# Patient Record
Sex: Male | Born: 1935 | Race: Black or African American | Hispanic: No | Marital: Married | Smoking: Former smoker
Health system: Southern US, Community
[De-identification: ages and names within clinical notes are randomized; demographics above are authoritative.]

## PROBLEM LIST (undated history)

## (undated) DIAGNOSIS — N400 Enlarged prostate without lower urinary tract symptoms: Secondary | ICD-10-CM

## (undated) DIAGNOSIS — C801 Malignant (primary) neoplasm, unspecified: Secondary | ICD-10-CM

## (undated) DIAGNOSIS — F0390 Unspecified dementia without behavioral disturbance: Secondary | ICD-10-CM

## (undated) DIAGNOSIS — I82409 Acute embolism and thrombosis of unspecified deep veins of unspecified lower extremity: Secondary | ICD-10-CM

## (undated) DIAGNOSIS — E785 Hyperlipidemia, unspecified: Secondary | ICD-10-CM

## (undated) DIAGNOSIS — I10 Essential (primary) hypertension: Secondary | ICD-10-CM

## (undated) HISTORY — PX: COLON SURGERY: SHX602

## (undated) HISTORY — DX: Malignant (primary) neoplasm, unspecified: C80.1

## (undated) HISTORY — PX: HERNIA REPAIR: SHX51

## (undated) HISTORY — DX: Hyperlipidemia, unspecified: E78.5

## (undated) HISTORY — DX: Essential (primary) hypertension: I10

## (undated) HISTORY — DX: Acute embolism and thrombosis of unspecified deep veins of unspecified lower extremity: I82.409

## (undated) HISTORY — DX: Benign prostatic hyperplasia without lower urinary tract symptoms: N40.0

---

## 2003-12-15 ENCOUNTER — Ambulatory Visit: Payer: Self-pay | Admitting: Internal Medicine

## 2004-06-14 ENCOUNTER — Ambulatory Visit: Payer: Self-pay | Admitting: Internal Medicine

## 2004-12-14 ENCOUNTER — Encounter: Payer: Self-pay | Admitting: Internal Medicine

## 2004-12-15 ENCOUNTER — Ambulatory Visit: Payer: Self-pay | Admitting: Internal Medicine

## 2005-03-20 ENCOUNTER — Ambulatory Visit: Payer: Self-pay | Admitting: Internal Medicine

## 2006-06-15 ENCOUNTER — Emergency Department (HOSPITAL_COMMUNITY): Admission: EM | Admit: 2006-06-15 | Discharge: 2006-06-15 | Payer: Self-pay | Admitting: *Deleted

## 2006-07-20 ENCOUNTER — Ambulatory Visit: Payer: Self-pay | Admitting: Internal Medicine

## 2006-07-20 LAB — CONVERTED CEMR LAB
Bilirubin, Direct: 0.1 mg/dL (ref 0.0–0.3)
Calcium: 9.4 mg/dL (ref 8.4–10.5)
Eosinophils Absolute: 0.1 10*3/uL (ref 0.0–0.6)
Eosinophils Relative: 2.6 % (ref 0.0–5.0)
GFR calc Af Amer: 61 mL/min
GFR calc non Af Amer: 51 mL/min
Glucose, Bld: 82 mg/dL (ref 70–99)
Lymphocytes Relative: 34.2 % (ref 12.0–46.0)
MCV: 93.9 fL (ref 78.0–100.0)
Monocytes Absolute: 0.3 10*3/uL (ref 0.2–0.7)
Neutro Abs: 1.7 10*3/uL (ref 1.4–7.7)
Neutrophils Relative %: 54.6 % (ref 43.0–77.0)
Platelets: 168 10*3/uL (ref 150–400)
Potassium: 4.7 meq/L (ref 3.5–5.1)
Sodium: 145 meq/L (ref 135–145)
TSH: 0.91 microintl units/mL (ref 0.35–5.50)
WBC: 3.2 10*3/uL — ABNORMAL LOW (ref 4.5–10.5)

## 2006-07-30 ENCOUNTER — Encounter: Payer: Self-pay | Admitting: Internal Medicine

## 2006-07-30 DIAGNOSIS — Z85038 Personal history of other malignant neoplasm of large intestine: Secondary | ICD-10-CM

## 2006-07-30 DIAGNOSIS — I10 Essential (primary) hypertension: Secondary | ICD-10-CM | POA: Insufficient documentation

## 2006-07-30 DIAGNOSIS — N400 Enlarged prostate without lower urinary tract symptoms: Secondary | ICD-10-CM

## 2006-07-30 DIAGNOSIS — E785 Hyperlipidemia, unspecified: Secondary | ICD-10-CM | POA: Insufficient documentation

## 2006-11-05 ENCOUNTER — Ambulatory Visit: Payer: Self-pay | Admitting: Internal Medicine

## 2006-11-05 DIAGNOSIS — D631 Anemia in chronic kidney disease: Secondary | ICD-10-CM

## 2006-11-05 DIAGNOSIS — N189 Chronic kidney disease, unspecified: Secondary | ICD-10-CM

## 2006-11-05 LAB — CONVERTED CEMR LAB
Basophils Relative: 0.3 % (ref 0.0–1.0)
HCT: 36.2 % — ABNORMAL LOW (ref 39.0–52.0)
Hemoglobin: 12.2 g/dL — ABNORMAL LOW (ref 13.0–17.0)
Monocytes Absolute: 0.3 10*3/uL (ref 0.2–0.7)
Neutrophils Relative %: 48.2 % (ref 43.0–77.0)
RBC: 3.83 M/uL — ABNORMAL LOW (ref 4.22–5.81)
RDW: 13.9 % (ref 11.5–14.6)
WBC: 3.4 10*3/uL — ABNORMAL LOW (ref 4.5–10.5)

## 2007-04-16 ENCOUNTER — Telehealth: Payer: Self-pay | Admitting: Internal Medicine

## 2007-05-06 ENCOUNTER — Emergency Department (HOSPITAL_COMMUNITY): Admission: EM | Admit: 2007-05-06 | Discharge: 2007-05-06 | Payer: Self-pay | Admitting: Emergency Medicine

## 2007-05-25 ENCOUNTER — Ambulatory Visit: Payer: Self-pay | Admitting: Family Medicine

## 2007-05-25 ENCOUNTER — Emergency Department (HOSPITAL_COMMUNITY): Admission: EM | Admit: 2007-05-25 | Discharge: 2007-05-25 | Payer: Self-pay | Admitting: Emergency Medicine

## 2007-05-25 DIAGNOSIS — J209 Acute bronchitis, unspecified: Secondary | ICD-10-CM

## 2007-05-28 ENCOUNTER — Ambulatory Visit: Payer: Self-pay

## 2007-05-28 ENCOUNTER — Ambulatory Visit: Payer: Self-pay | Admitting: Internal Medicine

## 2007-05-28 ENCOUNTER — Inpatient Hospital Stay (HOSPITAL_COMMUNITY): Admission: AD | Admit: 2007-05-28 | Discharge: 2007-05-31 | Payer: Self-pay | Admitting: Internal Medicine

## 2007-05-28 DIAGNOSIS — R55 Syncope and collapse: Secondary | ICD-10-CM

## 2007-05-28 DIAGNOSIS — M79609 Pain in unspecified limb: Secondary | ICD-10-CM

## 2007-05-29 ENCOUNTER — Telehealth: Payer: Self-pay | Admitting: Internal Medicine

## 2007-06-03 ENCOUNTER — Telehealth: Payer: Self-pay | Admitting: Internal Medicine

## 2007-06-03 ENCOUNTER — Encounter: Payer: Self-pay | Admitting: Internal Medicine

## 2007-06-06 ENCOUNTER — Telehealth: Payer: Self-pay | Admitting: Internal Medicine

## 2007-06-11 ENCOUNTER — Telehealth: Payer: Self-pay | Admitting: Internal Medicine

## 2007-06-12 ENCOUNTER — Telehealth: Payer: Self-pay | Admitting: Internal Medicine

## 2007-06-21 ENCOUNTER — Telehealth: Payer: Self-pay | Admitting: Internal Medicine

## 2007-06-24 ENCOUNTER — Ambulatory Visit: Payer: Self-pay | Admitting: Internal Medicine

## 2007-06-24 DIAGNOSIS — I82409 Acute embolism and thrombosis of unspecified deep veins of unspecified lower extremity: Secondary | ICD-10-CM | POA: Insufficient documentation

## 2007-07-04 ENCOUNTER — Encounter: Payer: Self-pay | Admitting: Internal Medicine

## 2007-07-15 ENCOUNTER — Ambulatory Visit: Payer: Self-pay | Admitting: Internal Medicine

## 2007-07-29 ENCOUNTER — Telehealth: Payer: Self-pay | Admitting: *Deleted

## 2007-07-29 ENCOUNTER — Ambulatory Visit: Payer: Self-pay | Admitting: Internal Medicine

## 2007-07-29 LAB — CONVERTED CEMR LAB
INR: 1.3
Prothrombin Time: 14.3 s

## 2007-08-26 ENCOUNTER — Ambulatory Visit: Payer: Self-pay | Admitting: Cardiology

## 2007-09-06 ENCOUNTER — Ambulatory Visit: Payer: Self-pay | Admitting: Cardiovascular Disease

## 2007-09-12 ENCOUNTER — Ambulatory Visit: Payer: Self-pay | Admitting: Cardiovascular Disease

## 2007-09-16 ENCOUNTER — Ambulatory Visit: Payer: Self-pay | Admitting: Cardiovascular Disease

## 2007-09-23 ENCOUNTER — Ambulatory Visit: Payer: Self-pay | Admitting: Cardiovascular Disease

## 2007-10-03 ENCOUNTER — Ambulatory Visit: Payer: Self-pay | Admitting: Internal Medicine

## 2007-10-17 ENCOUNTER — Ambulatory Visit: Payer: Self-pay | Admitting: Internal Medicine

## 2007-11-14 ENCOUNTER — Ambulatory Visit: Payer: Self-pay | Admitting: Cardiology

## 2007-12-12 ENCOUNTER — Ambulatory Visit: Payer: Self-pay | Admitting: Internal Medicine

## 2007-12-26 ENCOUNTER — Ambulatory Visit: Payer: Self-pay | Admitting: Cardiovascular Disease

## 2008-01-20 ENCOUNTER — Ambulatory Visit: Payer: Self-pay | Admitting: Internal Medicine

## 2008-02-07 ENCOUNTER — Ambulatory Visit: Payer: Self-pay | Admitting: Cardiovascular Disease

## 2008-02-19 ENCOUNTER — Ambulatory Visit: Payer: Self-pay | Admitting: Cardiovascular Disease

## 2008-03-04 ENCOUNTER — Ambulatory Visit: Payer: Self-pay | Admitting: Cardiology

## 2008-04-01 ENCOUNTER — Ambulatory Visit: Payer: Self-pay | Admitting: Cardiology

## 2008-04-15 ENCOUNTER — Ambulatory Visit: Payer: Self-pay | Admitting: Cardiology

## 2008-05-06 ENCOUNTER — Ambulatory Visit: Payer: Self-pay | Admitting: Cardiovascular Disease

## 2008-06-03 ENCOUNTER — Ambulatory Visit: Payer: Self-pay | Admitting: Cardiology

## 2008-06-16 ENCOUNTER — Ambulatory Visit: Payer: Self-pay | Admitting: Cardiology

## 2008-06-25 ENCOUNTER — Ambulatory Visit: Payer: Self-pay | Admitting: Internal Medicine

## 2008-06-30 ENCOUNTER — Ambulatory Visit: Payer: Self-pay | Admitting: Internal Medicine

## 2008-07-28 ENCOUNTER — Encounter: Payer: Self-pay | Admitting: *Deleted

## 2008-09-02 ENCOUNTER — Encounter: Payer: Self-pay | Admitting: *Deleted

## 2008-10-07 ENCOUNTER — Encounter: Payer: Self-pay | Admitting: Cardiology

## 2008-10-29 ENCOUNTER — Ambulatory Visit: Payer: Self-pay | Admitting: Internal Medicine

## 2008-10-29 DIAGNOSIS — R634 Abnormal weight loss: Secondary | ICD-10-CM

## 2008-10-29 LAB — CONVERTED CEMR LAB
ALT: 12 units/L (ref 0–53)
AST: 20 units/L (ref 0–37)
Albumin: 3.4 g/dL — ABNORMAL LOW (ref 3.5–5.2)
Alkaline Phosphatase: 71 units/L (ref 39–117)
Basophils Relative: 0.6 % (ref 0.0–3.0)
Bilirubin, Direct: 0.1 mg/dL (ref 0.0–0.3)
CO2: 28 meq/L (ref 19–32)
Calcium: 9 mg/dL (ref 8.4–10.5)
Chloride: 110 meq/L (ref 96–112)
Eosinophils Absolute: 0.1 10*3/uL (ref 0.0–0.7)
Eosinophils Relative: 2 % (ref 0.0–5.0)
Hemoglobin: 11.1 g/dL — ABNORMAL LOW (ref 13.0–17.0)
Lymphocytes Relative: 29.6 % (ref 12.0–46.0)
MCHC: 33.9 g/dL (ref 30.0–36.0)
MCV: 93 fL (ref 78.0–100.0)
Neutro Abs: 1.9 10*3/uL (ref 1.4–7.7)
Neutrophils Relative %: 57.4 % (ref 43.0–77.0)
RBC: 3.51 M/uL — ABNORMAL LOW (ref 4.22–5.81)
Sodium: 142 meq/L (ref 135–145)
Total Protein: 6.1 g/dL (ref 6.0–8.3)
WBC: 3.2 10*3/uL — ABNORMAL LOW (ref 4.5–10.5)

## 2009-09-14 ENCOUNTER — Telehealth: Payer: Self-pay | Admitting: Internal Medicine

## 2009-11-18 ENCOUNTER — Encounter: Payer: Self-pay | Admitting: Internal Medicine

## 2010-01-31 ENCOUNTER — Telehealth: Payer: Self-pay | Admitting: Internal Medicine

## 2010-02-03 ENCOUNTER — Encounter: Payer: Self-pay | Admitting: Internal Medicine

## 2010-02-03 ENCOUNTER — Ambulatory Visit: Payer: Self-pay | Admitting: Internal Medicine

## 2010-02-03 ENCOUNTER — Ambulatory Visit: Payer: Self-pay

## 2010-02-03 DIAGNOSIS — Z8672 Personal history of thrombophlebitis: Secondary | ICD-10-CM | POA: Insufficient documentation

## 2010-02-03 DIAGNOSIS — R7989 Other specified abnormal findings of blood chemistry: Secondary | ICD-10-CM | POA: Insufficient documentation

## 2010-02-03 LAB — CONVERTED CEMR LAB
ALT: 14 units/L (ref 0–53)
AST: 20 units/L (ref 0–37)
BUN: 30 mg/dL — ABNORMAL HIGH (ref 6–23)
Basophils Absolute: 0 10*3/uL (ref 0.0–0.1)
Bilirubin, Direct: 0.1 mg/dL (ref 0.0–0.3)
Calcium: 9.3 mg/dL (ref 8.4–10.5)
Creatinine, Ser: 1.5 mg/dL (ref 0.4–1.5)
Eosinophils Relative: 3.7 % (ref 0.0–5.0)
GFR calc non Af Amer: 55.79 mL/min — ABNORMAL LOW (ref >60.00)
Glucose, Bld: 89 mg/dL (ref 70–99)
Lymphocytes Relative: 27.6 % (ref 12.0–46.0)
Lymphs Abs: 1 10*3/uL (ref 0.7–4.0)
Monocytes Relative: 9.1 % (ref 3.0–12.0)
Neutrophils Relative %: 59 % (ref 43.0–77.0)
Platelets: 196 10*3/uL (ref 150.0–400.0)
Potassium: 5.2 meq/L — ABNORMAL HIGH (ref 3.5–5.1)
RDW: 15.9 % — ABNORMAL HIGH (ref 11.5–14.6)
TSH: 1.42 microintl units/mL (ref 0.35–5.50)
Total Bilirubin: 0.5 mg/dL (ref 0.3–1.2)
WBC: 3.7 10*3/uL — ABNORMAL LOW (ref 4.5–10.5)

## 2010-03-29 NOTE — Miscellaneous (Signed)
Summary: flu vaccine   Clinical Lists Changes  Observations: Added new observation of FLU VAX: Historical (11/18/2009 11:59)      Immunization History:  Influenza Immunization History:    Influenza:  Historical (11/18/2009) given at [email or web address]

## 2010-03-29 NOTE — Miscellaneous (Signed)
Summary: Flu Shot/[facility]  Flu Shot/[facility]   Imported By: Maryln Gottron 11/23/2009 10:32:45  _____________________________________________________________________  External Attachment:    Type:   Image     Comment:   External Document

## 2010-03-29 NOTE — Progress Notes (Signed)
Summary: refills   Phone Note Refill Request Message from:  Fax from Pharmacy on January 31, 2010 12:45 PM  Refills Requested: Medication #1:  CARDURA 4 MG TABS Take 1 once a day  Medication #2:  MONOPRIL 20 MG TABS 1 once a day. [facility]   Method Requested: Fax to [facility] Initial call taken by: Duard Brady LPN,  January 31, 2010 12:45 PM    Prescriptions: MONOPRIL 20 MG TABS (FOSINOPRIL SODIUM) 1 once a day  #90 x 0   Entered by:   Duard Brady LPN   Authorized by:   Gordy Savers  MD   Signed by:   Duard Brady LPN on 96/29/5284   Method used:   Historical   RxID:   [id number] CARDURA 4 MG TABS (DOXAZOSIN MESYLATE) Take 1 once a day  #90 x 0   Entered by:   Duard Brady LPN   Authorized by:   Gordy Savers  MD   Signed by:   Duard Brady LPN on 40/34/7425   Method used:   Historical   RxID:   [id number]  MUST BE SEEN - LAST SEEN 10/2008  faxed back to [facility]   kik

## 2010-03-29 NOTE — Progress Notes (Signed)
Summary: toenails  Phone Note Call from Patient Call back at [phone] or [phone]   Summary of Call: At foot doctor to get toenails clipped. [facility] [phone] [place].  They will not do without Dr. Charm Rings ok.   OK per Dr. Holly Bodily wife & [facility].  Appointment there has to be within 6 mo of appointment with Dr. Kirtland Bouchard.  Wife aware & knows she needs to make appt Dr. Kirtland Bouchard & then with them witnin 6 mo, a Medicare guideline. Rudy Jew, RN  September 14, 2009 9:51 AM  Initial call taken by: Rudy Jew, RN,  September 14, 2009 9:39 AM

## 2010-03-29 NOTE — Miscellaneous (Signed)
Summary: Orders Update  Clinical Lists Changes  Orders: Added new Test order of Venous Duplex Lower Extremity (Venous Duplex Lower) - Signed 

## 2010-03-29 NOTE — Assessment & Plan Note (Signed)
Summary: FU ON MEDS/[facility]   Vital Signs:  Patient profile:   77 year old male Weight:      152 pounds Temp:     97.5 degrees F oral BP sitting:   180 / 100  (right arm) Cuff size:   regular  Vitals Entered By: Duard Brady LPN (February 03, 2010 10:36 AM) CC: medication review with refill Is Patient Diabetic? No   Primary Care Provider:  K  CC:  medication review with refill.  History of Present Illness: 32 -year-old patient who is seen today for follow up.  He has a history of chronic venous insufficiency, and some stasis dermatitis.  For the past week.  He has had some increasing right lower leg edema.  He has remote history of colon cancer, diagnosed 30 years ago.  He has hypertension and dyslipidemia.  He does remarkably well at 77 years of age.  He does have a remote history of DVT  Allergies: 1)  Amoxicillin (Amoxicillin)  Past History:  Past Medical History: Reviewed history from 07/15/2007 and no changes required. Colon cancer, hx of Hyperlipidemia Hypertension Benign prostatic hypertrophy left leg DVT with pulmonary embolism, April 2009  Past Surgical History: Reviewed history from 07/30/2006 and no changes required. Colectomy, partial Inguinal herniorrhaphy  Family History: Reviewed history from 11/05/2006 and no changes required. details of father's health unknown mother died at childbirth one brother health unclear  Review of Systems       The patient complains of peripheral edema.  The patient denies anorexia, fever, weight loss, weight gain, vision loss, decreased hearing, hoarseness, chest pain, syncope, dyspnea on exertion, prolonged cough, headaches, hemoptysis, abdominal pain, melena, hematochezia, severe indigestion/heartburn, hematuria, incontinence, genital sores, muscle weakness, suspicious skin lesions, transient blindness, difficulty walking, depression, unusual weight change, abnormal bleeding, enlarged lymph nodes, angioedema, breast  masses, and testicular masses.    Physical Exam  General:  Well-developed,well-nourished,in no acute distress; alert,appropriate and cooperative throughout examination; approach 140/80 on repeat Head:  Normocephalic and atraumatic without obvious abnormalities. No apparent alopecia or balding. Eyes:  No corneal or conjunctival inflammation noted. EOMI. Perrla. Funduscopic exam benign, without hemorrhages, exudates or papilledema. Vision grossly normal. Mouth:  Oral mucosa and oropharynx without lesions or exudates.   Neck:  No deformities, masses, or tenderness noted. Chest Wall:  No deformities, masses, tenderness or gynecomastia noted. Lungs:  Normal respiratory effort, chest expands symmetrically. Lungs are clear to auscultation, no crackles or wheezes. Heart:  Normal rate and regular rhythm. S1 and S2 normal without gallop, murmur, click, rub or other extra sounds. Abdomen:  Bowel sounds positive,abdomen soft and non-tender without masses, organomegaly or hernias noted. Msk:  No deformity or scoliosis noted of thoracic or lumbar spine.   Extremities:  patient had swelling involving the right lower leg, especially the calf.  Stasis dermatitis was noted bilaterally with hyperpigmented  skin changes Skin:  Intact without suspicious lesions or rashes   Impression & Recommendations:  Problem # 1:  DVT (ICD-453.40)  patient has a history of prior DVT, and also chronic venous insufficiency.  Will check a d-dimer and if positive will proceed with a venous Doppler study  Orders: T-D-Dimer Fibrin Derivatives Quantitive [phone]) Specimen Handling (95284) Venipuncture (13244)  Problem # 2:  ANEMIA NOS (ICD-285.9)  Orders: Venipuncture (01027) TLB-BMP (Basic Metabolic Panel-BMET) (80048-METABOL) TLB-CBC Platelet - w/Differential (85025-CBCD) TLB-Hepatic/Liver Function Pnl (80076-HEPATIC) TLB-TSH (Thyroid Stimulating Hormone) (84443-TSH)  Problem # 3:  HYPERTENSION (ICD-401.9)  His  updated medication list for this problem  includes:    Cardura 4 Mg Tabs (Doxazosin mesylate) .Marland Kitchen... Take 1 once a day    Monopril 20 Mg Tabs (Fosinopril sodium) .Marland Kitchen... 1 once a day    His updated medication list for this problem includes:    Cardura 4 Mg Tabs (Doxazosin mesylate) .Marland Kitchen... Take 1 once a day    Monopril 20 Mg Tabs (Fosinopril sodium) .Marland Kitchen... 1 once a day  Orders: TLB-BMP (Basic Metabolic Panel-BMET) (80048-METABOL) TLB-Hepatic/Liver Function Pnl (80076-HEPATIC) TLB-TSH (Thyroid Stimulating Hormone) (84443-TSH) Specimen Handling (40102) Venipuncture (72536)  Complete Medication List: 1)  Cardura 4 Mg Tabs (Doxazosin mesylate) .... Take 1 once a day 2)  Monopril 20 Mg Tabs (Fosinopril sodium) .Marland Kitchen.. 1 once a day  Patient Instructions: 1)  Limit your Sodium (Salt). 2)  the legs elevated as much as possible 3)  Please schedule a follow-up appointment in 6 months. Prescriptions: MONOPRIL 20 MG TABS (FOSINOPRIL SODIUM) 1 once a day  #90 x 6   Entered and Authorized by:   Gordy Savers  MD   Signed by:   Gordy Savers  MD on 02/03/2010   Method used:   Electronically to        [facility] [address] [phone]* (retail)       [address] [place], [place]  [address]       Ph: [phone]       Fax: [phone]   RxID:   [id number] CARDURA 4 MG TABS (DOXAZOSIN MESYLATE) Take 1 once a day  #90 x 6   Entered and Authorized by:   Gordy Savers  MD   Signed by:   Gordy Savers  MD on 02/03/2010   Method used:   Electronically to        [facility] [address] [phone]* (retail)       [address] [place], [place]  [address]       Ph: [phone]       Fax: [phone]   RxID:   [id number]    Orders Added: 1)  Venipuncture [00938] 2)  TLB-BMP (Basic Metabolic Panel-BMET) [80048-METABOL] 3)  TLB-CBC Platelet - w/Differential [85025-CBCD] 4)  TLB-Hepatic/Liver Function Pnl [80076-HEPATIC] 5)  TLB-TSH (Thyroid  Stimulating Hormone) [84443-TSH] 6)  T-D-Dimer Fibrin Derivatives Quantitive [18299-37169] 7)  Est. Patient Level IV [67893] 8)  Specimen Handling [99000] 9)  Venipuncture [81017]

## 2010-06-18 ENCOUNTER — Emergency Department (HOSPITAL_COMMUNITY): Payer: Medicare Other

## 2010-06-18 ENCOUNTER — Emergency Department (HOSPITAL_COMMUNITY)
Admission: EM | Admit: 2010-06-18 | Discharge: 2010-06-18 | Disposition: A | Discharge status: Home / Self Care | Payer: Medicare Other | Attending: Emergency Medicine | Admitting: Emergency Medicine

## 2010-06-18 DIAGNOSIS — I10 Essential (primary) hypertension: Secondary | ICD-10-CM | POA: Insufficient documentation

## 2010-06-18 DIAGNOSIS — Z79899 Other long term (current) drug therapy: Secondary | ICD-10-CM | POA: Insufficient documentation

## 2010-06-18 DIAGNOSIS — Z85038 Personal history of other malignant neoplasm of large intestine: Secondary | ICD-10-CM | POA: Insufficient documentation

## 2010-06-18 DIAGNOSIS — M542 Cervicalgia: Secondary | ICD-10-CM | POA: Insufficient documentation

## 2010-06-18 DIAGNOSIS — M79609 Pain in unspecified limb: Secondary | ICD-10-CM | POA: Insufficient documentation

## 2010-06-18 DIAGNOSIS — Z9889 Other specified postprocedural states: Secondary | ICD-10-CM | POA: Insufficient documentation

## 2010-06-18 DIAGNOSIS — M5380 Other specified dorsopathies, site unspecified: Secondary | ICD-10-CM | POA: Insufficient documentation

## 2010-07-12 NOTE — Discharge Summary (Signed)
NAME:  Howard Padilla, Howard Padilla NO.:  [id number]   MEDICAL RECORD NO.:  [id number]          PATIENT TYPE:  INP   LOCATION:  5524                         FACILITY:  [facility]   PHYSICIAN:  Willow Ora, MD           DATE OF BIRTH:  04-24-1915   DATE OF ADMISSION:  05/28/2007  DATE OF DISCHARGE:  05/31/2007                               DISCHARGE SUMMARY   DISCHARGE DIAGNOSIS:  1. Left lower extremity deep venous thrombosis.  2. Pulmonary embolus.  3. Normocytic anemia.  4. Hypertension.  5. Hyperlipidemia.  6. Benign prostatic hypertrophy.  7. History of colon cancer.   HISTORY OF PRESENT ILLNESS:  Mr. Omalley is an 77 year old African  American male admitted on May 28, 2007 with chief complaint of left  lower extremity pain and swelling.  He was seen on the day of admission  in the office for followup after being evaluated in the emergency  department 3 days prior after suffering a syncopal episode, which was  thought to be vasovagal.  The patient was admitted for further  evaluation.   COURSE OF HOSPITALIZATION.:  1. Left lower extremity DVT.  The patient was admitted.  He was      anticoagulated with full dose of Lovenox and started on Coumadin.      He also underwent a VQ scan, which is positive for PE. We will      check a room air saturation with ambulation prior to discharge.  We      anticipate discharge home later on today.  He will be sent home      with Coumadin 5 mg p.o. daily and full dose Lovenox, which is 60 mg      subcu q.12 h, and to follow up with primary care physician on      Tuesday April 7.  We will ask the home health RN to assist the      patient in the home with injections as well as for assistance with      PT/INR monitoring.  We have requested a PT/INR draw on Monday April      7 and that these results will be called to Dr. Eleonore Chiquito.      INR at time of discharge is 1.5.  2. Abnormal CT chest.  There was a questionable left lower lobe  mass      on chest x-ray and a CT was recommended.  Noncontrast CT was      performed during this admission and noted that there was a left      lower lobe soft tissue density most likely a vascular abnormality      or AVM, but felt that neoplasm could not be excluded and suggested      that this could be confirmed by contrast CT.  As the patient's      creatinine is borderline at 1.45 and advanced age, we will defer      further workup to patient's primary MD at this time.   MEDICATIONS AT TIME OF DISCHARGE:  1. Cardura 4 mg p.o. daily.  2. Monopril 20 mg p.o. daily.  3. Colace 100 mg p.o. daily.  4. Coumadin 5 mg p.o. daily in the evening.  5. Lovenox 60 mg subcutaneous injection twice daily until INR      therapeutics for greater than 48-hour overlap with Lovenox.   PERTINENT LABORATORY DATA:  At time of discharge, INR 1.5, hemoglobin  10.4, hematocrit 30.5.   FOLLOW UP:  The patient is scheduled to follow up with Dr. Eleonore Chiquito on Tuesday, April 7 at 11:30 a.m.      Sandford Craze, NP      Willow Ora, MD  Electronically Signed    MO/MEDQ  D:  05/31/2007  T:  06/01/2007  Job:  [id number]   cc:   Gordy Savers, MD

## 2010-07-12 NOTE — H&P (Signed)
NAME:  Howard Padilla, Howard Padilla NO.:  [id number]   MEDICAL RECORD NO.:  [id number]          PATIENT TYPE:  INP   LOCATION:  5524                         FACILITY:  [facility]   PHYSICIAN:  Gordy Savers, MDDATE OF BIRTH:  November 10, 1915   DATE OF ADMISSION:  05/28/2007  DATE OF DISCHARGE:                              HISTORY & PHYSICAL   HISTORY OF PRESENT ILLNESS:  The patient is an 77 year old black  gentleman without prior history of thromboembolic disease. He was seen  in the day of admission in the office for followup after being evaluated  in the emergency room 3 days prior after a syncopal episode thought to  be vasovagal. He had been treated earlier in the day for suspected  bronchitis. Complaints included increasing pain and swelling involving  his left leg. Clinical exam revealed pain and swelling, and the patient  was set up for an outpatient venous Doppler examination.  This confirmed  extensive thrombosis involving the left leg deep vein system, and he is  now admitted for further evaluation and treatment of his left leg deep  vein thrombosis   In the office , the patient denied any pulmonary complaints. An oxygen  saturation was 97%.  On his ER evaluation recently, a CT scan of the  lungs was performed that was suspicious for a pulmonary nodule but felt  to be doubtful.   PAST MEDICAL HISTORY:  1. The patient has a remote history of colon cancer.  2. Hypertension.  3. Hyperlipidemia.  4. Benign prostatic hypertrophy.  5. As mentioned, he has a recent history of bronchitis.  6. He was evaluated 3 days ago for syncope.   PRESENT MEDICAL REGIMEN:  1. Cardura 4 mg daily.  2. Hydrochlorothiazide 25 mg daily.  3. Monopril 20 mg daily.  4. Biaxin 500 mg b.i.d.   ALLERGIES:  Include AMOXICILLIN.   FAMILY HISTORY:  Details of his father's health unknown.  Mother died at  childbirth. He has one brother whose health is  also unknown.   REVIEW OF SYSTEMS:   Exam was otherwise unremarkable except as mentioned  in the History of Present Illness.  He has had no further syncope,  shortness of breath.   PHYSICAL EXAMINATION:  GENERAL:  Exam revealed a well-developed elderly  male in no acute distress.  He was alert, appropriate.  HEAD AND NECK:  Revealed normal pupillary responses.  Conjunctiva clear.  Oropharynx was benign.  Neck revealed no bruits, adenopathy or neck vein  distention.  CHEST:  Clear.  There was no tachypnea.  O2 saturation was 97%,.  CARDIOVASCULAR:  Exam revealed normal rate and rhythm.  S1-S2 were  normal without murmurs.  ABDOMEN:  Soft and nontender.  No organomegaly.  EXTREMITIES:  Revealed some stasis changes involving both lower  extremities. His left leg distal to the knee was swollen, tender and  warm to touch. His left ankle and foot were also edematous.   IMPRESSION:  1. Left leg deep vein thrombosis.  2. Recent episode of syncope.  3. Hypertension.  4. Recent treated bronchitis.   ADDITIONAL DIAGNOSES:  1. Benign prostatic  hypertrophy.  2. Remote history of colon cancer.   DISPOSITION:  The patient will be admitted to the hospital for further  evaluation and treatment of his left leg DVT.  He will be begun on  Lovenox and Coumadin per pharmacy protocol.      Gordy Savers, MD  Electronically Signed     PFK/MEDQ  D:  05/28/2007  T:  05/28/2007  Job:  [id number]

## 2010-07-15 NOTE — Assessment & Plan Note (Signed)
[facility]                                 ON-CALL NOTE   Howard Padilla, Howard Padilla                      MRN:          [id number]  DATE:06/14/2006                            DOB:          [birth year]    DATE OF INTERACTION:  June 14, 2006 at 8:04 p.m.   PHONE NUMBER:  [phone].   CALLER:  [name], the patient's wife.   OBJECTIVE:  The patient has headaches.  Had a headache last night, as  did the wife.  Both of them went away.  Now has the headache back again.  Was okay during the day.  She takes Motrin for her headaches, gave some  to him, which has not helped.  Blood pressure was normal, which was  taken by the daughter.  He has no other symptoms or problems.  Does not  have cough, sore throat, runny nose.   ASSESSMENT:  Headache, unknown etiology.   PLAN:  Suggested they get some Tylenol Extra Strength.  Give him 2 now  and may repeat that in 5 or 6 hours.  If the headache has not resolved  by morning time, would call in the morning for an office visit.  If  things get much worse, go to the emergency room.   PRIMARY CARE Sakara Lehtinen:  Dr. Amador Cunas.  Home office is [facility].     Arta Silence, MD  Electronically Signed    RNS/MedQ  DD: 06/14/2006  DT: 06/15/2006  Job #: [id number]

## 2010-11-21 LAB — CBC
HCT: 36.3 — ABNORMAL LOW
HCT: 33.5 — ABNORMAL LOW
Hemoglobin: 11.1 — ABNORMAL LOW
Hemoglobin: 11.5 — ABNORMAL LOW
MCHC: 34.2
MCHC: 34.3
MCV: 92
MCV: 93.3
MCV: 93.4
Platelets: 133 — ABNORMAL LOW
Platelets: 190
RBC: 3.95 — ABNORMAL LOW
RBC: 3.48 — ABNORMAL LOW
RBC: 3.59 — ABNORMAL LOW
RDW: 15.1
WBC: 4.2
WBC: 7.4
WBC: 7.5

## 2010-11-21 LAB — BASIC METABOLIC PANEL
BUN: 29 — ABNORMAL HIGH
CO2: 24
Calcium: 9
Calcium: 8.9
Chloride: 107
Creatinine, Ser: 1.33
Creatinine, Ser: 1.46
GFR calc non Af Amer: 46 — ABNORMAL LOW
Glucose, Bld: 98
Glucose, Bld: 107 — ABNORMAL HIGH

## 2010-11-21 LAB — BASIC METABOLIC PANEL WITH GFR
CO2: 23
Chloride: 110
Potassium: 4.2
Sodium: 139

## 2010-11-21 LAB — DIFFERENTIAL
Basophils Absolute: 0
Basophils Relative: 0
Basophils Relative: 0
Eosinophils Absolute: 0.1
Eosinophils Absolute: 0
Eosinophils Relative: 2
Eosinophils Relative: 1
Lymphocytes Relative: 13
Lymphs Abs: 1.3
Lymphs Abs: 0.9
Monocytes Absolute: 0.6
Monocytes Relative: 8
Monocytes Relative: 8
Neutro Abs: 5.8
Neutrophils Relative %: 79 — ABNORMAL HIGH

## 2010-11-21 LAB — POCT CARDIAC MARKERS
CKMB, poc: 1.3
CKMB, poc: 1.5
Myoglobin, poc: 163
Myoglobin, poc: 94.5
Operator id: 265201
Operator id: 294521
Troponin i, poc: <0.05
Troponin i, poc: <0.05

## 2010-11-21 LAB — COMPREHENSIVE METABOLIC PANEL
ALT: 17
Alkaline Phosphatase: 113
CO2: 26
Calcium: 8.7
GFR calc non Af Amer: 46 — ABNORMAL LOW
Glucose, Bld: 102 — ABNORMAL HIGH
Sodium: 138

## 2010-11-21 LAB — OCCULT BLOOD X 1 CARD TO LAB, STOOL: Fecal Occult Bld: NEGATIVE

## 2010-11-21 LAB — APTT: aPTT: 40 — ABNORMAL HIGH

## 2010-11-21 LAB — PROTIME-INR: Prothrombin Time: 14.4

## 2010-11-22 LAB — CBC
HCT: 29.8 — ABNORMAL LOW
HCT: 30 — ABNORMAL LOW
MCHC: 34
MCHC: 34.1
MCV: 93
MCV: 93.1
MCV: 93.9
Platelets: 209
Platelets: 226
Platelets: 256
RBC: 3.2 — ABNORMAL LOW
RDW: 14.7
RDW: 14.9
WBC: 6

## 2010-11-22 LAB — URINALYSIS, MICROSCOPIC ONLY
Hgb urine dipstick: NEGATIVE
Leukocytes, UA: NEGATIVE
Protein, ur: NEGATIVE
Specific Gravity, Urine: 1.014
Urobilinogen, UA: 1

## 2010-11-22 LAB — PROTIME-INR
INR: 1.5
Prothrombin Time: 15.5 — ABNORMAL HIGH
Prothrombin Time: 16 — ABNORMAL HIGH
Prothrombin Time: 18.8 — ABNORMAL HIGH

## 2011-02-08 ENCOUNTER — Other Ambulatory Visit: Payer: Self-pay | Admitting: Internal Medicine

## 2011-04-10 ENCOUNTER — Other Ambulatory Visit: Payer: Self-pay | Admitting: Internal Medicine

## 2011-05-05 ENCOUNTER — Other Ambulatory Visit: Payer: Self-pay | Admitting: Internal Medicine

## 2011-06-09 ENCOUNTER — Other Ambulatory Visit: Payer: Self-pay | Admitting: Internal Medicine

## 2011-06-09 NOTE — Telephone Encounter (Signed)
I know pt requested 90 day rx but gave 60days until we can see - last seen 2011

## 2011-07-16 ENCOUNTER — Other Ambulatory Visit: Payer: Self-pay | Admitting: Internal Medicine

## 2011-07-17 ENCOUNTER — Other Ambulatory Visit: Payer: Self-pay | Admitting: Internal Medicine

## 2011-08-12 ENCOUNTER — Other Ambulatory Visit: Payer: Self-pay | Admitting: Internal Medicine

## 2011-08-17 ENCOUNTER — Encounter: Payer: Self-pay | Admitting: Internal Medicine

## 2011-08-17 ENCOUNTER — Ambulatory Visit (INDEPENDENT_AMBULATORY_CARE_PROVIDER_SITE_OTHER): Payer: Medicare Other | Admitting: Internal Medicine

## 2011-08-17 VITALS — BP 140/90 | Temp 98.0°F | Wt 150.0 lb

## 2011-08-17 DIAGNOSIS — I10 Essential (primary) hypertension: Secondary | ICD-10-CM

## 2011-08-17 DIAGNOSIS — Z85038 Personal history of other malignant neoplasm of large intestine: Secondary | ICD-10-CM | POA: Diagnosis not present

## 2011-08-17 DIAGNOSIS — E785 Hyperlipidemia, unspecified: Secondary | ICD-10-CM

## 2011-08-17 DIAGNOSIS — D649 Anemia, unspecified: Secondary | ICD-10-CM

## 2011-08-17 DIAGNOSIS — R634 Abnormal weight loss: Secondary | ICD-10-CM

## 2011-08-17 LAB — COMPREHENSIVE METABOLIC PANEL
AST: 21 U/L (ref 0–37)
BUN: 32 mg/dL — ABNORMAL HIGH (ref 6–23)
Calcium: 9 mg/dL (ref 8.4–10.5)
Chloride: 108 mEq/L (ref 96–112)
Creatinine, Ser: 1.6 mg/dL — ABNORMAL HIGH (ref 0.4–1.5)
Total Bilirubin: 0.5 mg/dL (ref 0.3–1.2)

## 2011-08-17 LAB — CBC WITH DIFFERENTIAL/PLATELET
Basophils Relative: 0.3 % (ref 0.0–3.0)
Eosinophils Absolute: 0.1 10*3/uL (ref 0.0–0.7)
HCT: 36.8 % — ABNORMAL LOW (ref 39.0–52.0)
Hemoglobin: 12.1 g/dL — ABNORMAL LOW (ref 13.0–17.0)
Lymphocytes Relative: 22.5 % (ref 12.0–46.0)
Lymphs Abs: 0.8 10*3/uL (ref 0.7–4.0)
MCHC: 32.9 g/dL (ref 30.0–36.0)
Monocytes Relative: 9.8 % (ref 3.0–12.0)
Neutro Abs: 2.2 10*3/uL (ref 1.4–7.7)
RBC: 3.81 Mil/uL — ABNORMAL LOW (ref 4.22–5.81)

## 2011-08-17 MED ORDER — DOXAZOSIN MESYLATE 4 MG PO TABS: 4.0000 mg | ORAL_TABLET | Freq: Every day | ORAL | Status: DC

## 2011-08-17 MED ORDER — FOSINOPRIL SODIUM 20 MG PO TABS: 20.0000 mg | ORAL_TABLET | Freq: Every day | ORAL | Status: DC

## 2011-08-17 NOTE — Patient Instructions (Signed)
Limit your sodium (Salt) intake  Please check your blood pressure on a regular basis.  If it is consistently greater than 150/90, please make an office appointment.  Return in 6 months for follow-up   

## 2011-08-17 NOTE — Progress Notes (Signed)
  Subjective:    Patient ID: Howard Padilla, male    DOB: 1915-12-16, 78 y.o.   MRN: [id number]  HPI  78 year old patient who is seen today for followup. He has a history of treated hypertension and BPH. He does remarkably well and has not been seen here in over 12 months. He has a remote history of colon cancer. Presently doing quite well appetite is well maintained no recent weight loss. He is accompanied by a daughter who also feels he has done remarkably well    Review of Systems  Constitutional: Negative for fever, chills, appetite change and fatigue.  HENT: Negative for hearing loss, ear pain, congestion, sore throat, trouble swallowing, neck stiffness, dental problem, voice change and tinnitus.   Eyes: Negative for pain, discharge and visual disturbance.  Respiratory: Negative for cough, chest tightness, wheezing and stridor.   Cardiovascular: Negative for chest pain, palpitations and leg swelling.  Gastrointestinal: Negative for nausea, vomiting, abdominal pain, diarrhea, constipation, blood in stool and abdominal distention.  Genitourinary: Negative for urgency, hematuria, flank pain, discharge, difficulty urinating and genital sores.  Musculoskeletal: Negative for myalgias, back pain, joint swelling, arthralgias and gait problem.  Skin: Negative for rash.  Neurological: Negative for dizziness, syncope, speech difficulty, weakness, numbness and headaches.  Hematological: Negative for adenopathy. Does not bruise/bleed easily.  Psychiatric/Behavioral: Negative for behavioral problems and dysphoric mood. The patient is not nervous/anxious.        Objective:   Physical Exam  Constitutional: He is oriented to person, place, and time. He appears well-developed.  HENT:  Head: Normocephalic.  Right Ear: External ear normal.  Left Ear: External ear normal.  Eyes: Conjunctivae and EOM are normal.       Arcus senilis  Neck: Normal range of motion.  Cardiovascular: Normal rate and  normal heart sounds.   Pulmonary/Chest: Breath sounds normal.  Abdominal: Soft. Bowel sounds are normal. He exhibits no distension. There is no tenderness. There is no rebound.       Well-healed abdominal scar no masses  Musculoskeletal: Normal range of motion. He exhibits no edema and no tenderness.  Neurological: He is alert and oriented to person, place, and time.  Psychiatric: He has a normal mood and affect. His behavior is normal.          Assessment & Plan:   Hypertension stable. We'll continue present regimen will check some updated lab Remote colon cancer. We'll check CBC and chemistries BPH stable we'll continue present regimen

## 2012-01-03 DIAGNOSIS — Z23 Encounter for immunization: Secondary | ICD-10-CM | POA: Diagnosis not present

## 2012-11-07 ENCOUNTER — Other Ambulatory Visit: Payer: Self-pay | Admitting: Internal Medicine

## 2012-11-21 DIAGNOSIS — Z23 Encounter for immunization: Secondary | ICD-10-CM | POA: Diagnosis not present

## 2013-02-06 DIAGNOSIS — L738 Other specified follicular disorders: Secondary | ICD-10-CM | POA: Diagnosis not present

## 2013-02-25 ENCOUNTER — Other Ambulatory Visit: Payer: Self-pay | Admitting: Internal Medicine

## 2013-02-25 NOTE — Telephone Encounter (Signed)
Pt now has a med check fu scheduled for next Friday, 1/6, and would like to receive a refill of last him until then. He currently only has a 1 day supply of both medications. Please assist.

## 2013-03-07 ENCOUNTER — Encounter: Payer: Self-pay | Admitting: Internal Medicine

## 2013-03-07 ENCOUNTER — Ambulatory Visit (INDEPENDENT_AMBULATORY_CARE_PROVIDER_SITE_OTHER): Payer: Medicare Other | Admitting: Internal Medicine

## 2013-03-07 VITALS — BP 120/82 | HR 100 | Temp 97.9°F | Resp 18 | Wt 144.0 lb

## 2013-03-07 DIAGNOSIS — N400 Enlarged prostate without lower urinary tract symptoms: Secondary | ICD-10-CM | POA: Diagnosis not present

## 2013-03-07 DIAGNOSIS — I10 Essential (primary) hypertension: Secondary | ICD-10-CM

## 2013-03-07 DIAGNOSIS — R7989 Other specified abnormal findings of blood chemistry: Secondary | ICD-10-CM | POA: Diagnosis not present

## 2013-03-07 DIAGNOSIS — Z23 Encounter for immunization: Secondary | ICD-10-CM | POA: Diagnosis not present

## 2013-03-07 DIAGNOSIS — R634 Abnormal weight loss: Secondary | ICD-10-CM

## 2013-03-07 DIAGNOSIS — Z85038 Personal history of other malignant neoplasm of large intestine: Secondary | ICD-10-CM | POA: Diagnosis not present

## 2013-03-07 LAB — TSH: TSH: 1.08 u[IU]/mL (ref 0.35–5.50)

## 2013-03-07 LAB — COMPREHENSIVE METABOLIC PANEL
ALBUMIN: 3.8 g/dL (ref 3.5–5.2)
ALT: 11 U/L (ref 0–53)
AST: 16 U/L (ref 0–37)
Alkaline Phosphatase: 69 U/L (ref 39–117)
BUN: 28 mg/dL — AB (ref 6–23)
CALCIUM: 9.2 mg/dL (ref 8.4–10.5)
CHLORIDE: 110 meq/L (ref 96–112)
CO2: 25 meq/L (ref 19–32)
Creatinine, Ser: 1.4 mg/dL (ref 0.4–1.5)
GFR: 60.48 mL/min (ref >60.00)
GLUCOSE: 78 mg/dL (ref 70–99)
POTASSIUM: 4.8 meq/L (ref 3.5–5.1)
SODIUM: 141 meq/L (ref 135–145)
TOTAL PROTEIN: 6.5 g/dL (ref 6.0–8.3)
Total Bilirubin: 0.8 mg/dL (ref 0.3–1.2)

## 2013-03-07 LAB — CBC WITH DIFFERENTIAL/PLATELET
BASOS PCT: 0.5 % (ref 0.0–3.0)
Basophils Absolute: 0 10*3/uL (ref 0.0–0.1)
EOS PCT: 2 % (ref 0.0–5.0)
Eosinophils Absolute: 0.1 10*3/uL (ref 0.0–0.7)
HCT: 35.7 % — ABNORMAL LOW (ref 39.0–52.0)
Hemoglobin: 11.9 g/dL — ABNORMAL LOW (ref 13.0–17.0)
LYMPHS PCT: 27.1 % (ref 12.0–46.0)
Lymphs Abs: 1 10*3/uL (ref 0.7–4.0)
MCHC: 33.5 g/dL (ref 30.0–36.0)
MCV: 93.4 fl (ref 78.0–100.0)
MONO ABS: 0.3 10*3/uL (ref 0.1–1.0)
MONOS PCT: 9.2 % (ref 3.0–12.0)
NEUTROS PCT: 61.2 % (ref 43.0–77.0)
Neutro Abs: 2.2 10*3/uL (ref 1.4–7.7)
PLATELETS: 175 10*3/uL (ref 150.0–400.0)
RBC: 3.82 Mil/uL — AB (ref 4.22–5.81)
RDW: 16.1 % — ABNORMAL HIGH (ref 11.5–14.6)
WBC: 3.6 10*3/uL — AB (ref 4.5–10.5)

## 2013-03-07 MED ORDER — FOSINOPRIL SODIUM 20 MG PO TABS: 20.0000 mg | ORAL_TABLET | Freq: Every day | ORAL | Status: DC

## 2013-03-07 MED ORDER — DOXAZOSIN MESYLATE 4 MG PO TABS: 4.0000 mg | ORAL_TABLET | Freq: Every day | ORAL | Status: DC

## 2013-03-07 NOTE — Patient Instructions (Signed)
Limit your sodium (Salt) intake  Return in one year for follow-up  Please mail back slides to check stools for hidden blood

## 2013-03-07 NOTE — Progress Notes (Signed)
Subjective:    Patient ID: Howard Padilla, male    DOB: 12-18-1915, 80 y.o.   MRN: [id number]  HPI  80 year old patient who has a history of hypertension. He has remote history of colon cancer. She has not been seen here in about a year and a half. He has done quite well. His appetite is excellent and he has maintained the his weight. No concerns or complaints.  Past Medical History  Diagnosis Date  . Cancer     colon  . Hyperlipidemia   . Hypertension   . BPH (benign prostatic hypertrophy)   . DVT (deep venous thrombosis)     with pulmonary embo.    History   Social History  . Marital Status: Married    Spouse Name: N/A    Number of Children: N/A  . Years of Education: N/A   Occupational History  . Not on file.   Social History Main Topics  . Smoking status: Former Smoker    Quit date: 02/28/1983  . Smokeless tobacco: Never Used  . Alcohol Use: No  . Drug Use: No  . Sexual Activity: Not on file   Other Topics Concern  . Not on file   Social History Narrative  . No narrative on file    Past Surgical History  Procedure Laterality Date  . Colon surgery      partial colectomy  . Hernia repair      ingunial    History reviewed. No pertinent family history.  Allergies  Allergen Reactions  . Amoxicillin     REACTION: unspecified    Current Outpatient Prescriptions on File Prior to Visit  Medication Sig Dispense Refill  . doxazosin (CARDURA) 4 MG tablet Take 1 tablet (4 mg total) by mouth at bedtime.  90 tablet  0  . fosinopril (MONOPRIL) 20 MG tablet Take 1 tablet (20 mg total) by mouth daily.  90 tablet  0   No current facility-administered medications on file prior to visit.    BP 120/82  Pulse 100  Temp(Src) 97.9 F (36.6 C) (Oral)  Resp 18  Wt 144 lb (65.318 kg)  SpO2 97%       Review of Systems  Constitutional: Negative for fever, chills, appetite change and fatigue.  HENT: Negative for congestion, dental problem, ear pain, hearing  loss, sore throat, tinnitus, trouble swallowing and voice change.   Eyes: Negative for pain, discharge and visual disturbance.  Respiratory: Negative for cough, chest tightness, wheezing and stridor.   Cardiovascular: Negative for chest pain, palpitations and leg swelling.  Gastrointestinal: Negative for nausea, vomiting, abdominal pain, diarrhea, constipation, blood in stool and abdominal distention.  Genitourinary: Negative for urgency, hematuria, flank pain, discharge, difficulty urinating and genital sores.  Musculoskeletal: Negative for arthralgias, back pain, gait problem, joint swelling, myalgias and neck stiffness.  Skin: Negative for rash.  Neurological: Negative for dizziness, syncope, speech difficulty, weakness, numbness and headaches.  Hematological: Negative for adenopathy. Does not bruise/bleed easily.  Psychiatric/Behavioral: Negative for behavioral problems and dysphoric mood. The patient is not nervous/anxious.        Objective:   Physical Exam  Constitutional: He is oriented to person, place, and time. He appears well-developed.  Elderly thin no acute distress. Blood pressure low normal  HENT:  Head: Normocephalic.  Right Ear: External ear normal.  Left Ear: External ear normal.  Eyes: Conjunctivae and EOM are normal.  Neck: Normal range of motion.  Cardiovascular: Normal rate and normal heart sounds.   Pulmonary/Chest:  Breath sounds normal.  Abdominal: Bowel sounds are normal.  Musculoskeletal: Normal range of motion. He exhibits no edema and no tenderness.  Neurological: He is alert and oriented to person, place, and time.  Psychiatric: He has a normal mood and affect. His behavior is normal.          Assessment & Plan:   Hypertension well controlled History of colon cancer BPH History of weight loss stable  We'll check a updated lab Recheck one year  We'll check stool for FOB

## 2013-03-07 NOTE — Progress Notes (Signed)
Pre-visit discussion using our clinic review tool. No additional management support is needed unless otherwise documented below in the visit note.  

## 2013-06-22 ENCOUNTER — Encounter (HOSPITAL_COMMUNITY): Payer: Self-pay | Admitting: Emergency Medicine

## 2013-06-22 ENCOUNTER — Inpatient Hospital Stay (HOSPITAL_COMMUNITY)
Admission: EM | Admit: 2013-06-22 | Discharge: 2013-06-24 | DRG: 177 | Disposition: A | Discharge status: Home Health | Payer: Medicare Other | Attending: Internal Medicine | Admitting: Internal Medicine

## 2013-06-22 ENCOUNTER — Emergency Department (HOSPITAL_COMMUNITY): Payer: Medicare Other

## 2013-06-22 ENCOUNTER — Inpatient Hospital Stay (HOSPITAL_COMMUNITY): Payer: Medicare Other

## 2013-06-22 DIAGNOSIS — Z79899 Other long term (current) drug therapy: Secondary | ICD-10-CM

## 2013-06-22 DIAGNOSIS — E860 Dehydration: Secondary | ICD-10-CM | POA: Diagnosis present

## 2013-06-22 DIAGNOSIS — I10 Essential (primary) hypertension: Secondary | ICD-10-CM

## 2013-06-22 DIAGNOSIS — Z87891 Personal history of nicotine dependence: Secondary | ICD-10-CM | POA: Diagnosis not present

## 2013-06-22 DIAGNOSIS — R109 Unspecified abdominal pain: Secondary | ICD-10-CM | POA: Diagnosis not present

## 2013-06-22 DIAGNOSIS — R5381 Other malaise: Secondary | ICD-10-CM | POA: Diagnosis not present

## 2013-06-22 DIAGNOSIS — R7989 Other specified abnormal findings of blood chemistry: Secondary | ICD-10-CM

## 2013-06-22 DIAGNOSIS — I129 Hypertensive chronic kidney disease with stage 1 through stage 4 chronic kidney disease, or unspecified chronic kidney disease: Secondary | ICD-10-CM | POA: Diagnosis present

## 2013-06-22 DIAGNOSIS — E785 Hyperlipidemia, unspecified: Secondary | ICD-10-CM | POA: Diagnosis present

## 2013-06-22 DIAGNOSIS — J189 Pneumonia, unspecified organism: Secondary | ICD-10-CM | POA: Diagnosis present

## 2013-06-22 DIAGNOSIS — R5383 Other fatigue: Secondary | ICD-10-CM

## 2013-06-22 DIAGNOSIS — Z86718 Personal history of other venous thrombosis and embolism: Secondary | ICD-10-CM | POA: Diagnosis not present

## 2013-06-22 DIAGNOSIS — Z880 Allergy status to penicillin: Secondary | ICD-10-CM | POA: Diagnosis not present

## 2013-06-22 DIAGNOSIS — N400 Enlarged prostate without lower urinary tract symptoms: Secondary | ICD-10-CM | POA: Diagnosis present

## 2013-06-22 DIAGNOSIS — J209 Acute bronchitis, unspecified: Secondary | ICD-10-CM

## 2013-06-22 DIAGNOSIS — R911 Solitary pulmonary nodule: Secondary | ICD-10-CM | POA: Diagnosis present

## 2013-06-22 DIAGNOSIS — M79609 Pain in unspecified limb: Secondary | ICD-10-CM

## 2013-06-22 DIAGNOSIS — N183 Chronic kidney disease, stage 3 unspecified: Secondary | ICD-10-CM | POA: Diagnosis not present

## 2013-06-22 DIAGNOSIS — J852 Abscess of lung without pneumonia: Principal | ICD-10-CM | POA: Diagnosis present

## 2013-06-22 DIAGNOSIS — R531 Weakness: Secondary | ICD-10-CM

## 2013-06-22 DIAGNOSIS — R627 Adult failure to thrive: Secondary | ICD-10-CM | POA: Diagnosis not present

## 2013-06-22 DIAGNOSIS — R55 Syncope and collapse: Secondary | ICD-10-CM

## 2013-06-22 DIAGNOSIS — R634 Abnormal weight loss: Secondary | ICD-10-CM

## 2013-06-22 DIAGNOSIS — R079 Chest pain, unspecified: Secondary | ICD-10-CM | POA: Diagnosis not present

## 2013-06-22 DIAGNOSIS — J449 Chronic obstructive pulmonary disease, unspecified: Secondary | ICD-10-CM | POA: Diagnosis not present

## 2013-06-22 DIAGNOSIS — Z85038 Personal history of other malignant neoplasm of large intestine: Secondary | ICD-10-CM | POA: Diagnosis not present

## 2013-06-22 DIAGNOSIS — D649 Anemia, unspecified: Secondary | ICD-10-CM

## 2013-06-22 DIAGNOSIS — J438 Other emphysema: Secondary | ICD-10-CM | POA: Diagnosis not present

## 2013-06-22 DIAGNOSIS — I82409 Acute embolism and thrombosis of unspecified deep veins of unspecified lower extremity: Secondary | ICD-10-CM

## 2013-06-22 DIAGNOSIS — J9819 Other pulmonary collapse: Secondary | ICD-10-CM | POA: Diagnosis not present

## 2013-06-22 DIAGNOSIS — J439 Emphysema, unspecified: Secondary | ICD-10-CM | POA: Diagnosis present

## 2013-06-22 DIAGNOSIS — Z8672 Personal history of thrombophlebitis: Secondary | ICD-10-CM

## 2013-06-22 DIAGNOSIS — IMO0002 Reserved for concepts with insufficient information to code with codable children: Secondary | ICD-10-CM

## 2013-06-22 LAB — URINALYSIS, ROUTINE W REFLEX MICROSCOPIC
Bilirubin Urine: NEGATIVE
GLUCOSE, UA: NEGATIVE mg/dL
Ketones, ur: NEGATIVE mg/dL
LEUKOCYTES UA: NEGATIVE
NITRITE: NEGATIVE
PROTEIN: 100 mg/dL — AB
Specific Gravity, Urine: 1.019 (ref 1.005–1.030)
UROBILINOGEN UA: 1 mg/dL (ref 0.0–1.0)
pH: 5 (ref 5.0–8.0)

## 2013-06-22 LAB — CBC
HCT: 35.3 % — ABNORMAL LOW (ref 39.0–52.0)
Hemoglobin: 12.1 g/dL — ABNORMAL LOW (ref 13.0–17.0)
MCH: 31.3 pg (ref 26.0–34.0)
MCHC: 34.3 g/dL (ref 30.0–36.0)
MCV: 91.5 fL (ref 78.0–100.0)
Platelets: 217 10*3/uL (ref 150–400)
RBC: 3.86 MIL/uL — AB (ref 4.22–5.81)
RDW: 15.6 % — AB (ref 11.5–15.5)
WBC: 7.6 10*3/uL (ref 4.0–10.5)

## 2013-06-22 LAB — COMPREHENSIVE METABOLIC PANEL
ALT: 18 U/L (ref 0–53)
AST: 27 U/L (ref 0–37)
Albumin: 2.9 g/dL — ABNORMAL LOW (ref 3.5–5.2)
Alkaline Phosphatase: 141 U/L — ABNORMAL HIGH (ref 39–117)
BUN: 33 mg/dL — ABNORMAL HIGH (ref 6–23)
CALCIUM: 9.5 mg/dL (ref 8.4–10.5)
CO2: 22 meq/L (ref 19–32)
CREATININE: 1.43 mg/dL — AB (ref 0.50–1.35)
Chloride: 104 mEq/L (ref 96–112)
GFR calc Af Amer: 46 mL/min — ABNORMAL LOW (ref >90)
GFR, EST NON AFRICAN AMERICAN: 40 mL/min — AB (ref >90)
Glucose, Bld: 145 mg/dL — ABNORMAL HIGH (ref 70–99)
Potassium: 4.5 mEq/L (ref 3.7–5.3)
Sodium: 141 mEq/L (ref 137–147)
Total Bilirubin: 0.5 mg/dL (ref 0.3–1.2)
Total Protein: 7.2 g/dL (ref 6.0–8.3)

## 2013-06-22 LAB — I-STAT CHEM 8, ED
BUN: 36 mg/dL — ABNORMAL HIGH (ref 6–23)
CREATININE: 1.6 mg/dL — AB (ref 0.50–1.35)
Calcium, Ion: 1.04 mmol/L — ABNORMAL LOW (ref 1.13–1.30)
Chloride: 109 mEq/L (ref 96–112)
GLUCOSE: 149 mg/dL — AB (ref 70–99)
HCT: 37 % — ABNORMAL LOW (ref 39.0–52.0)
HEMOGLOBIN: 12.6 g/dL — AB (ref 13.0–17.0)
Potassium: 4.4 mEq/L (ref 3.7–5.3)
Sodium: 141 mEq/L (ref 137–147)
TCO2: 23 mmol/L (ref 0–100)

## 2013-06-22 LAB — URINE MICROSCOPIC-ADD ON

## 2013-06-22 LAB — LACTIC ACID, PLASMA: LACTIC ACID, VENOUS: 1.2 mmol/L (ref 0.5–2.2)

## 2013-06-22 LAB — CBG MONITORING, ED: Glucose-Capillary: 126 mg/dL — ABNORMAL HIGH (ref 70–99)

## 2013-06-22 LAB — TROPONIN I

## 2013-06-22 MED ORDER — DOXAZOSIN MESYLATE 4 MG PO TABS
4.0000 mg | ORAL_TABLET | Freq: Every day | ORAL | Status: DC
  Administered 2013-06-22 – 2013-06-23 (×2): 4 mg via ORAL
  Filled 2013-06-22 (×4): qty 1

## 2013-06-22 MED ORDER — METRONIDAZOLE IN NACL 5-0.79 MG/ML-% IV SOLN
500.0000 mg | Freq: Three times a day (TID) | INTRAVENOUS | Status: DC
  Administered 2013-06-22 – 2013-06-24 (×6): 500 mg via INTRAVENOUS
  Filled 2013-06-22 (×8): qty 100

## 2013-06-22 MED ORDER — MORPHINE SULFATE 2 MG/ML IJ SOLN
2.0000 mg | Freq: Once | INTRAMUSCULAR | Status: AC
  Administered 2013-06-22: 2 mg via INTRAVENOUS
  Filled 2013-06-22: qty 1

## 2013-06-22 MED ORDER — HEPARIN SODIUM (PORCINE) 5000 UNIT/ML IJ SOLN
5000.0000 [IU] | Freq: Three times a day (TID) | INTRAMUSCULAR | Status: DC
Start: 2013-06-22 — End: 2013-06-24
  Administered 2013-06-22 – 2013-06-24 (×5): 5000 [IU] via SUBCUTANEOUS
  Filled 2013-06-22 (×8): qty 1

## 2013-06-22 MED ORDER — ONDANSETRON HCL 4 MG PO TABS: 4.0000 mg | ORAL_TABLET | Freq: Four times a day (QID) | ORAL | Status: DC | PRN

## 2013-06-22 MED ORDER — SODIUM CHLORIDE 0.9 % IV BOLUS (SEPSIS)
500.0000 mL | Freq: Once | INTRAVENOUS | Status: AC
  Administered 2013-06-22: 500 mL via INTRAVENOUS

## 2013-06-22 MED ORDER — ONDANSETRON HCL 4 MG/2ML IJ SOLN
4.0000 mg | Freq: Four times a day (QID) | INTRAMUSCULAR | Status: DC | PRN
Start: 2013-06-22 — End: 2013-06-24

## 2013-06-22 MED ORDER — SODIUM CHLORIDE 0.9 % IV SOLN
INTRAVENOUS | Status: DC
  Administered 2013-06-22: 16:00:00 via INTRAVENOUS

## 2013-06-22 MED ORDER — ALBUTEROL SULFATE (2.5 MG/3ML) 0.083% IN NEBU
5.0000 mg | INHALATION_SOLUTION | Freq: Once | RESPIRATORY_TRACT | Status: AC
  Administered 2013-06-22: 5 mg via RESPIRATORY_TRACT
  Filled 2013-06-22: qty 6

## 2013-06-22 MED ORDER — ACETAMINOPHEN 325 MG PO TABS: 650.0000 mg | ORAL_TABLET | Freq: Four times a day (QID) | ORAL | Status: DC | PRN

## 2013-06-22 MED ORDER — MORPHINE SULFATE 2 MG/ML IJ SOLN
2.0000 mg | INTRAMUSCULAR | Status: DC | PRN
  Administered 2013-06-23: 2 mg via INTRAVENOUS
  Filled 2013-06-22: qty 1

## 2013-06-22 MED ORDER — POLYETHYLENE GLYCOL 3350 17 G PO PACK
17.0000 g | PACK | Freq: Every day | ORAL | Status: DC | PRN
  Filled 2013-06-22: qty 1

## 2013-06-22 MED ORDER — SODIUM CHLORIDE 0.9 % IV SOLN
INTRAVENOUS | Status: DC
  Administered 2013-06-22 – 2013-06-24 (×2): via INTRAVENOUS

## 2013-06-22 MED ORDER — ACETAMINOPHEN 650 MG RE SUPP: 650.0000 mg | Freq: Four times a day (QID) | RECTAL | Status: DC | PRN

## 2013-06-22 MED ORDER — ACETAMINOPHEN-CODEINE #3 300-30 MG PO TABS: 1.0000 | ORAL_TABLET | ORAL | Status: DC | PRN

## 2013-06-22 MED ORDER — ONDANSETRON HCL 4 MG/2ML IJ SOLN
4.0000 mg | Freq: Once | INTRAMUSCULAR | Status: AC
  Administered 2013-06-22: 4 mg via INTRAVENOUS
  Filled 2013-06-22: qty 2

## 2013-06-22 MED ORDER — AZITHROMYCIN 500 MG IV SOLR: 500.0000 mg | Freq: Once | INTRAVENOUS | Status: DC

## 2013-06-22 MED ORDER — ALUM & MAG HYDROXIDE-SIMETH 200-200-20 MG/5ML PO SUSP: 30.0000 mL | Freq: Four times a day (QID) | ORAL | Status: DC | PRN

## 2013-06-22 MED ORDER — LEVOFLOXACIN IN D5W 750 MG/150ML IV SOLN
750.0000 mg | INTRAVENOUS | Status: DC
  Administered 2013-06-22: 750 mg via INTRAVENOUS
  Filled 2013-06-22 (×2): qty 150

## 2013-06-22 MED ORDER — DEXTROSE 5 % IV SOLN
1.0000 g | Freq: Once | INTRAVENOUS | Status: AC
  Administered 2013-06-22: 1 g via INTRAVENOUS
  Filled 2013-06-22: qty 10

## 2013-06-22 NOTE — H&P (Addendum)
[facility] History and Physical  Howard Padilla ZOX:[id number] DOB: 1915-11-29 DOA: 06/22/2013  Referring physician: Lajean Saver, MD PCP: Nyoka Cowden, MD    Chief Complaint: left chest pain and cough  HPI: Howard Padilla is a 80 y.o. male with PMH as below who comes in for left sided chest pain starting today and cough that has been present for 2-3 days. No sputum with his cough. He overall leads a healthy and active lifestyle for his age. He has not had fevers but has been fatigued and eating poorly for the past 2 days.      General: The patient denies anorexia, fever, weight loss Cardiac: Denies chest pain, syncope, palpitations, pedal edema  Respiratory: Denies dyspnea on exertion GI: Denies severe  indigestion/heartburn, abdominal pain, nausea, vomiting, diarrhea  GU: Denies hematuria, incontinence, dysuria + frequency of micturation Musculoskeletal: Denies muscle pain Skin: Denies suspicious skin lesions Neurologic: Denies focal weakness or numbness, change in vision  Past Medical History  Diagnosis Date  . Cancer     colon  . Hypertension   . BPH (benign prostatic hypertrophy)   . DVT (deep venous thrombosis)     with pulmonary embo.   Past Surgical History  Procedure Laterality Date  . Colon surgery      partial colectomy  . Hernia repair      ingunial   Social History:  reports that he quit smoking about 30 years ago. He has never used smokeless tobacco. He reports that he does not drink alcohol or use illicit drugs. Lives at home with his wife Good with ADLs  Allergies  Allergen Reactions  . Amoxicillin     REACTION: unspecified  . Penicillins Rash    History reviewed. No pertinent family history.   Prior to Admission medications   Medication Sig Start Date End Date Taking? Authorizing Provider  Ascorbic Acid (VITAMIN C PO) Take 1 tablet by mouth daily.   Yes Historical Provider, MD  doxazosin (CARDURA) 4 MG tablet Take 1 tablet (4  mg total) by mouth at bedtime. 03/07/13  Yes [name], MD  fosinopril (MONOPRIL) 20 MG tablet Take 1 tablet (20 mg total) by mouth daily. 03/07/13  Yes [name], MD  Multiple Vitamin (MULTIVITAMIN) tablet Take 1 tablet by mouth daily.   Yes Historical Provider, MD     Physical Exam: Filed Vitals:   06/22/13 1442  BP: 136/84  Pulse: 104  Temp: 98.6 F (37 C)    General: AAO x3 , no distress HEENT: Normocephalic and Atraumatic, Mucous membranes pink                PERRLA; EOM intact; No scleral icterus,                 Nares: Patent, Oropharynx: Clear, Fair Dentition                 Neck: FROM, no cervical lymphadenopathy, thyromegaly, carotid bruit or JVD;  Breasts: deferred CHEST WALL: No tenderness  CHEST: Normal respiration, clear to auscultation bilaterally  HEART: Regular rate and rhythm; no murmurs rubs or gallops  BACK: No kyphosis or scoliosis; no CVA tenderness  ABDOMEN: Positive Bowel Sounds, soft, non-tender; no masses, no organomegaly Rectal Exam: deferred EXTREMITIES: No cyanosis, clubbing, or edema Genitalia: not examined  SKIN:  no rash or ulceration  CNS: Alert and Oriented x 4, Nonfocal exam, CN 2-12 intact  Labs on Admission:  Basic Metabolic Panel:  Recent Labs Lab 06/22/13 1441 06/22/13 1502  NA 141 141  K 4.5 4.4  CL 104 109  CO2 22  --   GLUCOSE 145* 149*  BUN 33* 36*  CREATININE 1.43* 1.60*  CALCIUM 9.5  --    Liver Function Tests:  Recent Labs Lab 06/22/13 1441  AST 27  ALT 18  ALKPHOS 141*  BILITOT 0.5  PROT 7.2  ALBUMIN 2.9*   No results found for this basename: LIPASE, AMYLASE,  in the last 168 hours No results found for this basename: AMMONIA,  in the last 168 hours CBC:  Recent Labs Lab 06/22/13 1441 06/22/13 1502  WBC 7.6  --   HGB 12.1* 12.6*  HCT 35.3* 37.0*  MCV 91.5  --   PLT 217  --    Cardiac Enzymes:  Recent Labs Lab 06/22/13 1441  TROPONINI <0.30    BNP (last 3 results) No results  found for this basename: PROBNP,  in the last 8760 hours CBG:  Recent Labs Lab 06/22/13 1508  GLUCAP 126*    Radiological Exams on Admission: Dg Chest 2 View  06/22/2013   CLINICAL DATA:  Chest pain, short of breath  EXAM: CHEST  2 VIEW  COMPARISON:  Prior chest x-ray 05/29/2007; prior chest CT 05/30/2007  FINDINGS: In the region of the previously noted bullous emphysema, there is now a layering fluid and gas collection. Additionally, the margin of the bladder is markedly thickened. Suggestion of a left hilar mass versus adenopathy. Cardiomegaly. Ectatic bordering on aneurysmal ascending thoracic aorta. Atherosclerotic calcifications noted in the aorta. Inspiratory volumes are low. Linear right basilar atelectasis. No pneumothorax. No acute osseous abnormality. .  IMPRESSION: 1. Layering air-fluid level in the superior segment of the left lower lobe in the region of the previously identified massive pulmonary bulla. Findings are concerning for retained secretions and superimposed infection. Fungal infection is not excluded. 2. Left hilar adenopathy versus mass. This may be reactive and related to the superinfection of the left lower lobe bulla. However, an underlying neoplastic process is not excluded. Recommend further evaluation with CT scan of the chest with contrast. 3. Ectasia bordering on aneurysmal dilatation of the ascending thoracic aorta. 4. Cardiomegaly. 5. Low inspiratory volumes with right basilar atelectasis. 6. Background COPD and emphysema.   Electronically Signed   By: Jacqulynn Cadet M.D.   On: 06/22/2013 16:01    EKG: Independently reviewed. Sinus rhythm 100 bpm, LAFB, LVB  Assessment/Plan Principal Problem:   Lung abscess - it appears that he has a large bleb in his left lung which was noted on a CXR in 2009 - this now has become infected - will start Levaquin and Flagyl - obtain sputum culture if possible - check for urine strep and legionella - CT chest without contrast  due to Cr of 1.6 - Pulm consult requested- they will evaluate him in the AM  Active Problems: CKD 3 and dehydration - Cr close to baseline but based on BUN/ Cr ratio and exam, currently he appears dehydrated - will start slow NS     HYPERTENSION - cont home meds    BENIGN PROSTATIC HYPERTROPHY - cont home meds    COLON CANCER, HX OF    Consulted: pulmonary  Code Status: Full code  Family Communication: with wife and daughter Disposition Plan: home in 2-3 days   Time spent: > 45 min  Debbe Odea, MD [facility]  If 7PM-7AM, please contact night-coverage [email or web address] 06/22/2013, 6:02 PM

## 2013-06-22 NOTE — Progress Notes (Signed)
ANTIBIOTIC CONSULT NOTE - INITIAL  Pharmacy Consult for levofloxacin Indication: pneumonia  Allergies  Allergen Reactions  . Amoxicillin     REACTION: unspecified  . Penicillins Rash    Patient Measurements: Height: 5\' 9"  (175.3 cm) Weight: 145 lb (65.772 kg) IBW/kg (Calculated) : 70.7  Vital Signs: Temp: 98.6 F (37 C) (04/26 1442) Temp src: Oral (04/26 1442) BP: 195/107 mmHg (04/26 1442) Pulse Rate: 104 (04/26 1442)   Medical History: Past Medical History  Diagnosis Date  . Cancer     colon  . Hyperlipidemia   . Hypertension   . BPH (benign prostatic hypertrophy)   . DVT (deep venous thrombosis)     with pulmonary embo.    Medications:  Scheduled:  . doxazosin  4 mg Oral QHS   Infusions:  . sodium chloride 10 mL/hr at 06/22/13 1533  . sodium chloride    . metronidazole     Assessment: 89 yoM admitted 4/26 with shortness of breath, generalized weakness, and myalgias x 2-3 days PTA. Pharmacy has been consulted to dose levofloxacin for suspected CAP.  Noted patient received ceftriaxone 1g IV x 1 in the ED at 1701  Antiinfectives 4/26 >> ceftriaxone x1 4/26 >> levofloxacin >>   4/26 >> metronidazole >>  Labs / vitals Tmax: afebrile WBCs: WNL Renal: SCr 1.6 (baseline 1.5-1.6), CrCl 25 ml/min CG/N  No microbiologic data available for this admission   Goal of Therapy:  levofloxacin dosing per indication and renal function  Plan:  - levofloxacin 750mg  IV q48h, dosed per reduced renal function - follow-up renal function, length of therapy, appropriate timing of initiation of PO formulation  Thank you for the consult.  Johny Drilling, PharmD, BCPS Pager: [phone] Pharmacy: [phone] 06/22/2013 5:52 PM

## 2013-06-22 NOTE — ED Provider Notes (Signed)
CSN: [id number]     Arrival date & time 06/22/13  1429 History   First MD Initiated Contact with Patient 06/22/13 1503     Chief Complaint  Patient presents with  . Weakness     (Consider location/radiation/quality/duration/timing/severity/associated sxs/prior Treatment) Patient is a 80 y.o. male presenting with weakness. The history is provided by the patient.  Weakness Associated symptoms include shortness of breath. Pertinent negatives include no chest pain, no abdominal pain and no headaches.  pt w remote hx colon ca, htn, c/o generalized weakness, achy all over, for the past 2-3 days. Denies any focal numbness or weakness.  Symptoms constant for past 2-3 days. Family notes decreased appetite. Recent increased non productive cough, mild sob. No sore throat or runny nose. No headaches. Subjective fever, no sweats or chills. No chest pain or discomfort. No abd pain. No vomiting or diarrhea. No dysuria, family notes increased frequency. No focal extremity pain or swelling. No skin changes or rash. No recent change in meds. No recent trauma or fall.      Past Medical History  Diagnosis Date  . Cancer     colon  . Hyperlipidemia   . Hypertension   . BPH (benign prostatic hypertrophy)   . DVT (deep venous thrombosis)     with pulmonary embo.   Past Surgical History  Procedure Laterality Date  . Colon surgery      partial colectomy  . Hernia repair      ingunial   History reviewed. No pertinent family history. History  Substance Use Topics  . Smoking status: Former Smoker    Quit date: 02/28/1983  . Smokeless tobacco: Never Used  . Alcohol Use: No    Review of Systems  Constitutional: Positive for fever. Negative for chills.  HENT: Negative for sore throat.   Eyes: Negative for redness.  Respiratory: Positive for cough and shortness of breath.   Cardiovascular: Negative for chest pain and leg swelling.  Gastrointestinal: Negative for vomiting, abdominal pain, diarrhea  and blood in stool.  Genitourinary: Negative for dysuria and flank pain.  Musculoskeletal: Negative for back pain and neck pain.  Skin: Negative for rash.  Neurological: Positive for weakness. Negative for numbness and headaches.  Hematological: Does not bruise/bleed easily.  Psychiatric/Behavioral: Negative for confusion.      Allergies  Amoxicillin and Penicillins  Home Medications   Prior to Admission medications   Medication Sig Start Date End Date Taking? Authorizing Provider  Ascorbic Acid (VITAMIN C PO) Take 1 tablet by mouth daily.   Yes Historical Provider, MD  doxazosin (CARDURA) 4 MG tablet Take 1 tablet (4 mg total) by mouth at bedtime. 03/07/13  Yes [name], MD  fosinopril (MONOPRIL) 20 MG tablet Take 1 tablet (20 mg total) by mouth daily. 03/07/13  Yes [name], MD  Multiple Vitamin (MULTIVITAMIN) tablet Take 1 tablet by mouth daily.   Yes Historical Provider, MD   BP 195/107  Pulse 104  Temp(Src) 98.6 F (37 C) (Oral)  Ht 5\' 9"  (1.753 m)  Wt 145 lb (65.772 kg)  BMI 21.40 kg/m2  SpO2 10% Physical Exam  Nursing note and vitals reviewed. Constitutional: He is oriented to person, place, and time. No distress.  Thin appearing, very elderly male  HENT:  Head: Atraumatic.  Mouth/Throat: Oropharynx is clear and moist.  Eyes: Conjunctivae are normal. Pupils are equal, round, and reactive to light. No scleral icterus.  Neck: Neck supple. No tracheal deviation present.  No stiffness or rigidity  Cardiovascular: Regular rhythm, normal heart sounds and intact distal pulses.   Pulmonary/Chest: Effort normal. No accessory muscle usage. No respiratory distress. He has rales.  Rales, diminished bs left mid to lower lung. Sl wheezing.   Abdominal: Soft. Bowel sounds are normal. He exhibits no distension and no mass. There is no tenderness. There is no rebound and no guarding.  Genitourinary:  No cva tenderness  Musculoskeletal: Normal range of motion. He  exhibits no edema and no tenderness.  Neurological: He is alert and oriented to person, place, and time.  Skin: Skin is warm and dry. No rash noted. He is not diaphoretic.  Psychiatric: He has a normal mood and affect.    ED Course  Procedures (including critical care time) Labs Review   Results for orders placed during the hospital encounter of 06/22/13  CBC      Result Value Ref Range   WBC 7.6  4.0 - 10.5 K/uL   RBC 3.86 (*) 4.22 - 5.81 MIL/uL   Hemoglobin 12.1 (*) 13.0 - 17.0 g/dL   HCT 35.3 (*) 39.0 - 52.0 %   MCV 91.5  78.0 - 100.0 fL   MCH 31.3  26.0 - 34.0 pg   MCHC 34.3  30.0 - 36.0 g/dL   RDW 15.6 (*) 11.5 - 15.5 %   Platelets 217  150 - 400 K/uL  COMPREHENSIVE METABOLIC PANEL      Result Value Ref Range   Sodium 141  137 - 147 mEq/L   Potassium 4.5  3.7 - 5.3 mEq/L   Chloride 104  96 - 112 mEq/L   CO2 22  19 - 32 mEq/L   Glucose, Bld 145 (*) 70 - 99 mg/dL   BUN 33 (*) 6 - 23 mg/dL   Creatinine, Ser 1.43 (*) 0.50 - 1.35 mg/dL   Calcium 9.5  8.4 - 10.5 mg/dL   Total Protein 7.2  6.0 - 8.3 g/dL   Albumin 2.9 (*) 3.5 - 5.2 g/dL   AST 27  0 - 37 U/L   ALT 18  0 - 53 U/L   Alkaline Phosphatase 141 (*) 39 - 117 U/L   Total Bilirubin 0.5  0.3 - 1.2 mg/dL   GFR calc non Af Amer 40 (*) >90 mL/min   GFR calc Af Amer 46 (*) >90 mL/min  TROPONIN I      Result Value Ref Range   Troponin I <0.30  <0.30 ng/mL  URINALYSIS, ROUTINE W REFLEX MICROSCOPIC      Result Value Ref Range   Color, Urine YELLOW  YELLOW   APPearance CLEAR  CLEAR   Specific Gravity, Urine 1.019  1.005 - 1.030   pH 5.0  5.0 - 8.0   Glucose, UA NEGATIVE  NEGATIVE mg/dL   Hgb urine dipstick TRACE (*) NEGATIVE   Bilirubin Urine NEGATIVE  NEGATIVE   Ketones, ur NEGATIVE  NEGATIVE mg/dL   Protein, ur 100 (*) NEGATIVE mg/dL   Urobilinogen, UA 1.0  0.0 - 1.0 mg/dL   Nitrite NEGATIVE  NEGATIVE   Leukocytes, UA NEGATIVE  NEGATIVE  URINE MICROSCOPIC-ADD ON      Result Value Ref Range   Squamous  Epithelial / LPF RARE  RARE   RBC / HPF 0-2  <3 RBC/hpf   Casts GRANULAR CAST (*) NEGATIVE  I-STAT CHEM 8, ED      Result Value Ref Range   Sodium 141  137 - 147 mEq/L   Potassium 4.4  3.7 - 5.3 mEq/L  Chloride 109  96 - 112 mEq/L   BUN 36 (*) 6 - 23 mg/dL   Creatinine, Ser 1.60 (*) 0.50 - 1.35 mg/dL   Glucose, Bld 149 (*) 70 - 99 mg/dL   Calcium, Ion 1.04 (*) 1.13 - 1.30 mmol/L   TCO2 23  0 - 100 mmol/L   Hemoglobin 12.6 (*) 13.0 - 17.0 g/dL   HCT 37.0 (*) 39.0 - 52.0 %  CBG MONITORING, ED      Result Value Ref Range   Glucose-Capillary 126 (*) 70 - 99 mg/dL   Dg Chest 2 View  06/22/2013   CLINICAL DATA:  Chest pain, short of breath  EXAM: CHEST  2 VIEW  COMPARISON:  Prior chest x-ray 05/29/2007; prior chest CT 05/30/2007  FINDINGS: In the region of the previously noted bullous emphysema, there is now a layering fluid and gas collection. Additionally, the margin of the bladder is markedly thickened. Suggestion of a left hilar mass versus adenopathy. Cardiomegaly. Ectatic bordering on aneurysmal ascending thoracic aorta. Atherosclerotic calcifications noted in the aorta. Inspiratory volumes are low. Linear right basilar atelectasis. No pneumothorax. No acute osseous abnormality. .  IMPRESSION: 1. Layering air-fluid level in the superior segment of the left lower lobe in the region of the previously identified massive pulmonary bulla. Findings are concerning for retained secretions and superimposed infection. Fungal infection is not excluded. 2. Left hilar adenopathy versus mass. This may be reactive and related to the superinfection of the left lower lobe bulla. However, an underlying neoplastic process is not excluded. Recommend further evaluation with CT scan of the chest with contrast. 3. Ectasia bordering on aneurysmal dilatation of the ascending thoracic aorta. 4. Cardiomegaly. 5. Low inspiratory volumes with right basilar atelectasis. 6. Background COPD and emphysema.   Electronically  Signed   By: Jacqulynn Cadet M.D.   On: 06/22/2013 16:01        EKG Interpretation   Date/Time:  Sunday June 22 2013 15:08:15 EDT Ventricular Rate:  100 PR Interval:  175 QRS Duration: 109 QT Interval:  357 QTC Calculation: 460 R Axis:   -67 Text Interpretation:  Sinus tachycardia Ventricular premature complex Left  anterior fascicular block Left ventricular hypertrophy No previous tracing  Confirmed by Deontrey Massi  MD, Lennette Bihari ([id number]) on 06/22/2013 3:26:07 PM      MDM  Iv 500 cc ns bolus . Labs. Cxr. Urine.  Reviewed nursing notes and prior charts for additional history.   cxr result noted. Given increased cough, sob, and subjective fever, will rx for cap.  May need ct as part of further inpatient workup.  Rocephin iv, zithromax iv.   Med service contacted for admission.    Mirna Mires, MD 06/22/13 [phone]

## 2013-06-22 NOTE — ED Notes (Signed)
Per EMS pt c/o Generalized weakness x 2 days

## 2013-06-22 NOTE — ED Notes (Signed)
Bed: FU93 Expected date:  Expected time:  Means of arrival:  Comments: EMS- abdominal pain, generalized body aches

## 2013-06-22 NOTE — ED Notes (Signed)
Report called to [place], RN for transfer to [employer] [facility]

## 2013-06-23 ENCOUNTER — Encounter (HOSPITAL_COMMUNITY): Payer: Self-pay

## 2013-06-23 DIAGNOSIS — J189 Pneumonia, unspecified organism: Secondary | ICD-10-CM | POA: Diagnosis not present

## 2013-06-23 DIAGNOSIS — I10 Essential (primary) hypertension: Secondary | ICD-10-CM | POA: Diagnosis not present

## 2013-06-23 DIAGNOSIS — N183 Chronic kidney disease, stage 3 unspecified: Secondary | ICD-10-CM

## 2013-06-23 DIAGNOSIS — J439 Emphysema, unspecified: Secondary | ICD-10-CM

## 2013-06-23 LAB — CBC
HEMATOCRIT: 29.7 % — AB (ref 39.0–52.0)
HEMOGLOBIN: 10 g/dL — AB (ref 13.0–17.0)
MCH: 31.1 pg (ref 26.0–34.0)
MCHC: 33.7 g/dL (ref 30.0–36.0)
MCV: 92.2 fL (ref 78.0–100.0)
Platelets: 206 10*3/uL (ref 150–400)
RBC: 3.22 MIL/uL — ABNORMAL LOW (ref 4.22–5.81)
RDW: 15.5 % (ref 11.5–15.5)
WBC: 6.1 10*3/uL (ref 4.0–10.5)

## 2013-06-23 LAB — COMPREHENSIVE METABOLIC PANEL
ALT: 15 U/L (ref 0–53)
AST: 18 U/L (ref 0–37)
Albumin: 2.2 g/dL — ABNORMAL LOW (ref 3.5–5.2)
Alkaline Phosphatase: 98 U/L (ref 39–117)
BUN: 31 mg/dL — ABNORMAL HIGH (ref 6–23)
CALCIUM: 8.4 mg/dL (ref 8.4–10.5)
CO2: 22 mEq/L (ref 19–32)
CREATININE: 1.43 mg/dL — AB (ref 0.50–1.35)
Chloride: 106 mEq/L (ref 96–112)
GFR calc Af Amer: 46 mL/min — ABNORMAL LOW (ref >90)
GFR, EST NON AFRICAN AMERICAN: 40 mL/min — AB (ref >90)
GLUCOSE: 115 mg/dL — AB (ref 70–99)
Potassium: 4.2 mEq/L (ref 3.7–5.3)
SODIUM: 142 meq/L (ref 137–147)
TOTAL PROTEIN: 5.8 g/dL — AB (ref 6.0–8.3)
Total Bilirubin: 0.3 mg/dL (ref 0.3–1.2)

## 2013-06-23 LAB — LEGIONELLA ANTIGEN, URINE: Legionella Antigen, Urine: NEGATIVE

## 2013-06-23 LAB — STREP PNEUMONIAE URINARY ANTIGEN: STREP PNEUMO URINARY ANTIGEN: NEGATIVE

## 2013-06-23 MED ORDER — LORAZEPAM 0.5 MG PO TABS
0.2500 mg | ORAL_TABLET | Freq: Once | ORAL | Status: AC | PRN
  Administered 2013-06-23: 0.25 mg via ORAL
  Filled 2013-06-23: qty 1

## 2013-06-23 NOTE — Progress Notes (Signed)
[facility] Progress Note   Howard Padilla UVO:[id number] DOB: June 02, 1915 DOA: 06/22/2013 PCP: Nyoka Cowden, MD  Brief narrative: Howard Padilla is a 80 y.o. male presenting on 06/22/2013 with  who presents with PMH as below who comes in for left sided chest pain starting today and cough that has been present for 2-3 days. No sputum with his cough. He overall leads a healthy and active lifestyle for his age. He has not had fevers but has been fatigued and eating poorly for the past 2 days.  Subjective: Feels much better. No further cough or chest pain.   Assessment/Plan: Principal Problem:   Lung abscess - Levaquin and Flagyl  Active Problems: CKD - with dehydration - cont IV hydration    HYPERTENSION - cont home meds    BENIGN PROSTATIC HYPERTROPHY - cont Doxazosin    COLON CANCER, HX OF    Code Status: Full code Family Communication: with wife Disposition Plan: home in 1-2 days  Consultants: Pulmonary consult pending  Procedures: none  Antibiotics: Antibiotics Given (last 72 hours)   Date/Time Action Medication Dose Rate   06/22/13 2258 Given   levofloxacin (LEVAQUIN) IVPB 750 mg 750 mg 100 mL/hr       DVT prophylaxis: Heparin  Objective: Filed Weights   06/22/13 1442 06/22/13 1950  Weight: 65.772 kg (145 lb) 65 kg (143 lb 4.8 oz)   Blood pressure 125/72, pulse 77, temperature 99.2 F (37.3 C), temperature source Oral, resp. rate 18, height 5\' 9"  (1.753 m), weight 65 kg (143 lb 4.8 oz), SpO2 91.00%.  Intake/Output Summary (Last 24 hours) at 06/23/13 0937 Last data filed at 06/23/13 0001  Gross per 24 hour  Intake      0 ml  Output    200 ml  Net   -200 ml     Exam: General: No acute respiratory distress Lungs: Clear to auscultation bilaterally without wheezes or crackles Cardiovascular: Regular rate and rhythm without murmur gallop or rub normal S1 and S2 Abdomen: Nontender, nondistended, soft, bowel sounds positive, no  rebound, no ascites, no appreciable mass Extremities: No significant cyanosis, clubbing, or edema bilateral lower extremities  Data Reviewed: Basic Metabolic Panel:  Recent Labs Lab 06/22/13 1441 06/22/13 1502 06/23/13 0420  NA 141 141 142  K 4.5 4.4 4.2  CL 104 109 106  CO2 22  --  22  GLUCOSE 145* 149* 115*  BUN 33* 36* 31*  CREATININE 1.43* 1.60* 1.43*  CALCIUM 9.5  --  8.4   Liver Function Tests:  Recent Labs Lab 06/22/13 1441 06/23/13 0420  AST 27 18  ALT 18 15  ALKPHOS 141* 98  BILITOT 0.5 0.3  PROT 7.2 5.8*  ALBUMIN 2.9* 2.2*   No results found for this basename: LIPASE, AMYLASE,  in the last 168 hours No results found for this basename: AMMONIA,  in the last 168 hours CBC:  Recent Labs Lab 06/22/13 1441 06/22/13 1502 06/23/13 0420  WBC 7.6  --  6.1  HGB 12.1* 12.6* 10.0*  HCT 35.3* 37.0* 29.7*  MCV 91.5  --  92.2  PLT 217  --  206   Cardiac Enzymes:  Recent Labs Lab 06/22/13 1441  TROPONINI <0.30   BNP (last 3 results) No results found for this basename: PROBNP,  in the last 8760 hours CBG:  Recent Labs Lab 06/22/13 1508  GLUCAP 126*    No results found for this or any previous visit (from the past 240 hour(s)).   Studies:  Recent x-ray  studies have been reviewed in detail by the Attending Physician  Scheduled Meds:  Scheduled Meds: . doxazosin  4 mg Oral QHS  . heparin  5,000 Units Subcutaneous 3 times per day  . levofloxacin (LEVAQUIN) IV  750 mg Intravenous Q48H  . metronidazole  500 mg Intravenous Q8H   Continuous Infusions: . sodium chloride 10 mL/hr at 06/22/13 1533  . sodium chloride 75 mL/hr at 06/22/13 2258    Time spent on care of this patient: >35 min   Debbe Odea, MD 06/23/2013, 9:37 AM  LOS: 1 day   [facility] Office  [phone] Pager - Text Page per Shea Evans   If 7PM-7AM, please contact night-coverage [email or web address]

## 2013-06-23 NOTE — Consult Note (Signed)
PULMONARY / CRITICAL CARE MEDICINE   Name: Howard Padilla MRN: [id number] DOB: 11-21-1915    ADMISSION DATE:  06/22/2013 CONSULTATION DATE: 4/27  REFERRING MD :  [facility] PRIMARY SERVICE: [facility]  CHIEF COMPLAINT:  Weakness and weight loss  BRIEF PATIENT DESCRIPTION:   80 AAM who noted 2 weeks of increasing weakness and weight loss(despite having a great appetite) no cough or sputum production, negative F,C,S and chest pain. He just quit working 2 years ago and works in his yard daily up till 1 week ago and stopped due to weakness. CT scan reveals LLL bullous with fluid and air. He is a remote smoker who quit 60 years ago. PCCM asked to evaluate. SIGNIFICANT EVENTS / STUDIES:    LINES / TUBES:   CULTURES:   ANTIBIOTICS: 4/26 Levaquin>> 4/26 flagyl>>  HISTORY OF PRESENT ILLNESS:   80 AAM who noted 2 weeks of increasing weakness and weight loss(despite having a great appetite) no cough or sputum production, negative F,C,S and chest pain. He just quit working 2 years ago and works in his yard daily up till 1 week ago and stopped due to weakness. CT scan reveals LLL bullous with fluid and air. He is a remote smoker who quit 60 years ago. PCCM asked to evaluate.  PAST MEDICAL HISTORY :  Past Medical History  Diagnosis Date  . Cancer     colon  . Hyperlipidemia   . Hypertension   . BPH (benign prostatic hypertrophy)   . DVT (deep venous thrombosis)     with pulmonary embo.   Past Surgical History  Procedure Laterality Date  . Colon surgery      partial colectomy  . Hernia repair      ingunial   Prior to Admission medications   Medication Sig Start Date End Date Taking? Authorizing Provider  Ascorbic Acid (VITAMIN C PO) Take 1 tablet by mouth daily.   Yes Historical Provider, MD  doxazosin (CARDURA) 4 MG tablet Take 1 tablet (4 mg total) by mouth at bedtime. 03/07/13  Yes [name], MD  fosinopril (MONOPRIL) 20 MG tablet Take 1 tablet (20 mg total) by mouth daily.  03/07/13  Yes [name], MD  Multiple Vitamin (MULTIVITAMIN) tablet Take 1 tablet by mouth daily.   Yes Historical Provider, MD   Allergies  Allergen Reactions  . Amoxicillin     REACTION: unspecified  . Penicillins Rash    FAMILY HISTORY:  History reviewed. No pertinent family history. SOCIAL HISTORY:  reports that he quit smoking about 30 years ago. He has never used smokeless tobacco. He reports that he does not drink alcohol or use illicit drugs.  REVIEW OF SYSTEMS:  10 point review of system taken, please see HPI for positives and negatives.   SUBJECTIVE:   VITAL SIGNS: Temp:  [97.5 F (36.4 C)-99.2 F (37.3 C)] 99.2 F (37.3 C) (04/27 0524) Pulse Rate:  [77-104] 77 (04/27 0524) Resp:  [18-25] 18 (04/27 0524) BP: (125-195)/(69-107) 125/72 mmHg (04/27 0524) SpO2:  [10 %-97 %] 91 % (04/27 0524) Weight:  [65 kg (143 lb 4.8 oz)-65.772 kg (145 lb)] 65 kg (143 lb 4.8 oz) (04/26 1950) HEMODYNAMICS:   VENTILATOR SETTINGS:   INTAKE / OUTPUT: Intake/Output     04/26 0701 - 04/27 0700 04/27 0701 - 04/28 0700   Urine (mL/kg/hr) 200    Total Output 200     Net -200          Urine Occurrence 2 x  PHYSICAL EXAMINATION: General:  A&O x 3. NAD Neuro:Intact HEENT: No JVD/LAN Cardiovascular: HSR RRR Lungs: CTA Abdomen:  +bs Musculoskeletal: Intact Skin:  warm  LABS:  CBC  Recent Labs Lab 06/22/13 1441 06/22/13 1502 06/23/13 0420  WBC 7.6  --  6.1  HGB 12.1* 12.6* 10.0*  HCT 35.3* 37.0* 29.7*  PLT 217  --  206   Coag's No results found for this basename: APTT, INR,  in the last 168 hours BMET  Recent Labs Lab 06/22/13 1441 06/22/13 1502 06/23/13 0420  NA 141 141 142  K 4.5 4.4 4.2  CL 104 109 106  CO2 22  --  22  BUN 33* 36* 31*  CREATININE 1.43* 1.60* 1.43*  GLUCOSE 145* 149* 115*   Electrolytes  Recent Labs Lab 06/22/13 1441 06/23/13 0420  CALCIUM 9.5 8.4   Sepsis Markers  Recent Labs Lab 06/22/13 1650  LATICACIDVEN 1.2    ABG No results found for this basename: PHART, PCO2ART, PO2ART,  in the last 168 hours Liver Enzymes  Recent Labs Lab 06/22/13 1441 06/23/13 0420  AST 27 18  ALT 18 15  ALKPHOS 141* 98  BILITOT 0.5 0.3  ALBUMIN 2.9* 2.2*   Cardiac Enzymes  Recent Labs Lab 06/22/13 1441  TROPONINI <0.30   Glucose  Recent Labs Lab 06/22/13 1508  GLUCAP 126*    Imaging Dg Chest 2 View  06/22/2013   CLINICAL DATA:  Chest pain, short of breath  EXAM: CHEST  2 VIEW  COMPARISON:  Prior chest x-ray 05/29/2007; prior chest CT 05/30/2007  FINDINGS: In the region of the previously noted bullous emphysema, there is now a layering fluid and gas collection. Additionally, the margin of the bladder is markedly thickened. Suggestion of a left hilar mass versus adenopathy. Cardiomegaly. Ectatic bordering on aneurysmal ascending thoracic aorta. Atherosclerotic calcifications noted in the aorta. Inspiratory volumes are low. Linear right basilar atelectasis. No pneumothorax. No acute osseous abnormality. .  IMPRESSION: 1. Layering air-fluid level in the superior segment of the left lower lobe in the region of the previously identified massive pulmonary bulla. Findings are concerning for retained secretions and superimposed infection. Fungal infection is not excluded. 2. Left hilar adenopathy versus mass. This may be reactive and related to the superinfection of the left lower lobe bulla. However, an underlying neoplastic process is not excluded. Recommend further evaluation with CT scan of the chest with contrast. 3. Ectasia bordering on aneurysmal dilatation of the ascending thoracic aorta. 4. Cardiomegaly. 5. Low inspiratory volumes with right basilar atelectasis. 6. Background COPD and emphysema.   Electronically Signed   By: Jacqulynn Cadet M.D.   On: 06/22/2013 16:01   Ct Chest Wo Contrast  06/23/2013   CLINICAL DATA:  Shortness of breath and suspected lung abscess  EXAM: CT CHEST WITHOUT CONTRAST  TECHNIQUE:  Multidetector CT imaging of the chest was performed following the standard protocol without IV contrast.  COMPARISON:  DG CHEST 2 VIEW dated 06/22/2013; CT CHEST W/O CM dated 05/30/2007  FINDINGS: There are bullous emphysematous changes bilaterally. On the study of May 30, 2007 a large bullous lesion was present in the superior segment of the left lower lobe. On today's study this is partially filled with fluid. A large air-fluid level is demonstrated. There are small amounts of fluid present medial to this bullous lesion within abnormal lung parenchyma. There is also small amount of pleural fluid layering posteriorly in the left lower hemithorax with adjacent parenchymal consolidation. There are similar findings on the right.  The right middle lobe and right upper lobe exhibit no evidence of pneumonia. The left upper lobe demonstrates minimal basilar increased interstitial density.  At mediastinal window settings the cardiac chambers are top-normal in size. The ascending aorta is mildly dilated at 4.3 mm. The descending aorta isnormal in caliber. There is mural calcification within the aorta. No bulky mediastinal or hilar lymph nodes are demonstrated. The thoracic esophagus is normal in caliber.  The thoracic vertebral bodies are preserved in height. The sternum and observed portions of the ribs exhibit no acute abnormalities. Within the upper abdomen the observed portions of the liver exhibit no acute abnormalities.  IMPRESSION: 1. There are emphysematous changes in both lungs. A large pre-existing thin walled bullous lesion in the superior segment of the left lower lobe contains fluid and air. This may reflect a superinfection of this bullous lesion. No suspicious mass is demonstrated, but the study is limited being noncontrast in nature. In addition to the findings in the superior segment of the left lower lobe there is a small left pleural effusion layering posteriorly with adjacent parenchymal consolidation which  may reflect pneumonia or atelectasis. 2. In the right lower lobe there is atelectasis versus pneumonia posteriorly. 3. There is no evidence of CHF. There is dilation of the aortic root to 4.3 cm. There is no pericardial effusion.   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 History of pulmonary nodule but would not pursue further work up in a 80 yo.   TODAY'S SUMMARY:  43 AAM who noted 2 weeks of increasing weakness and weight loss(despite having a great appetite) no cough or sputum production, negative F,C,S and chest pain. He just quit working 2 years ago and works in his yard daily up till 1 week ago and stopped due to weakness. CT scan reveals LLL bullous with fluid and air. He is a remote smoker who quit 60 years ago. PCCM asked to evaluate.  Gaylyn Lambert, ACNP  PCCM ATTENDING: I have interviewed and examined the patient and reviewed the database. I have formulated the assessment and plan as reflected in the note above with amendments made by me.   THOUGHTS: 1) Of note, there was thought to be a LLL nodule on 2 CXRs in 2009. It is plausible that some of what we are looking at on CXR and CT chest represents tumor progression 2) However, there is little benefit in making a diagnosis of lung cancer (a slowly progressive one @ that) in a 80 yo with advanced emphysema as there would be no reasonable therapeutic options to offer him 3) For now would work with the following diagnoses  -Bullitis  -Probable LLL PNA  -Possible LLL lung mass  REC: Prolonged course of abx - 21 days levofloxacin (at least) Repeat CXR in 2-3 wks  (Dr Inda Merlin is primary MD) Depending on follow up CXR, consider re-eval by Pulmonary medicine as outpt Favor least invasive  diagnostic and therapeutic approach possible Address Advanced Directives  Merton Border, MD;  PCCM service; Mobile [phone]

## 2013-06-24 DIAGNOSIS — R911 Solitary pulmonary nodule: Secondary | ICD-10-CM | POA: Diagnosis present

## 2013-06-24 DIAGNOSIS — J439 Emphysema, unspecified: Secondary | ICD-10-CM | POA: Diagnosis not present

## 2013-06-24 DIAGNOSIS — J852 Abscess of lung without pneumonia: Secondary | ICD-10-CM | POA: Diagnosis not present

## 2013-06-24 DIAGNOSIS — J189 Pneumonia, unspecified organism: Secondary | ICD-10-CM | POA: Diagnosis not present

## 2013-06-24 LAB — BASIC METABOLIC PANEL
BUN: 23 mg/dL (ref 6–23)
CHLORIDE: 103 meq/L (ref 96–112)
CO2: 21 mEq/L (ref 19–32)
CREATININE: 1.21 mg/dL (ref 0.50–1.35)
Calcium: 8.6 mg/dL (ref 8.4–10.5)
GFR calc non Af Amer: 49 mL/min — ABNORMAL LOW (ref >90)
GFR, EST AFRICAN AMERICAN: 57 mL/min — AB (ref >90)
Glucose, Bld: 111 mg/dL — ABNORMAL HIGH (ref 70–99)
Potassium: 3.9 mEq/L (ref 3.7–5.3)
SODIUM: 138 meq/L (ref 137–147)

## 2013-06-24 MED ORDER — METRONIDAZOLE 500 MG PO TABS: 500.0000 mg | ORAL_TABLET | Freq: Three times a day (TID) | ORAL | Status: DC

## 2013-06-24 MED ORDER — LORAZEPAM 0.5 MG PO TABS
0.2500 mg | ORAL_TABLET | Freq: Once | ORAL | Status: AC | PRN
  Administered 2013-06-24: 0.25 mg via ORAL
  Filled 2013-06-24 (×2): qty 1

## 2013-06-24 MED ORDER — LEVOFLOXACIN 750 MG PO TABS: 750.0000 mg | ORAL_TABLET | Freq: Every day | ORAL | Status: DC

## 2013-06-24 NOTE — Progress Notes (Signed)
Patient given discharge instructions, and verbalized an understanding of all discharge instructions.  Patient agrees with discharge plan, and is being discharged in stable medical condition.  Patient given transportation via wheelchair.  Rahel Carlton RN 

## 2013-06-24 NOTE — Progress Notes (Signed)
Clinical Social Work  CSW received inappropriate referral for SNF placement. Per chart review, patient to DC home with family and [facility] services. CSW is signing off but available if further needs arise.  [place], [place] [phone]

## 2013-06-24 NOTE — Discharge Summary (Addendum)
Physician Discharge Summary  Howard Padilla ZOX:[id number] DOB: [birth year] DOA: 06/22/2013  PCP: Nyoka Cowden, MD  Admit date: 06/22/2013 Discharge date: 06/24/2013  Time spent: >45  minutes  Recommendations for Outpatient Follow-up:  Must f/u with PUlmonary office in 2-3 wks.  Going home with HHPT  Discharge Diagnoses:  Principal Problem:   Lung abscess Active Problems:   Lung nodule   HYPERTENSION   BENIGN PROSTATIC HYPERTROPHY   COLON CANCER, HX OF   CKD (chronic kidney disease) stage 3, GFR 30-59 ml/min   CAP (community acquired pneumonia)   Bullous emphysema   Discharge Condition: stable  Diet recommendation: heart healthy  Filed Weights   06/22/13 1442 06/22/13 1950  Weight: 65.772 kg (145 lb) 65 kg (143 lb 4.8 oz)    History of present illness:  Howard Padilla is a 80 y.o. male with PMH as below who comes in for left sided chest pain starting today and cough that has been present for 2-3 days. No sputum with his cough. He overall leads a healthy and active lifestyle for his age. He has not had fevers but has been fatigued and eating poorly for the past 2 days.    Hospital Course:  Principal Problem:  Lung abscess / Bullitis/ probably LLL pneumonia -the patient has a large bulla in the med left lung (chronic) which has now become infected - Pulmonary has evaluated the patient and are recommending that we continue antibiotics for a 21 day course and f/u with pulm as an outpt - cont Levaquin and Flagyl x 21 days total  Active Problems:   LLL nodule  - question Cancer. Pulmonary has spoken with the patient and notes that there is little benefit in pursuing a cancer work up in a 80 y/o male with advanced emphysema.   CKD - with dehydration  - hydrated via IV with normalization in BUN/ Cr level  HYPERTENSION  - cont home meds   BENIGN PROSTATIC HYPERTROPHY  - cont Doxazosin   COLON CANCER, HX  OF   Procedures:  none  Consultations:  pulmonary  Discharge Exam: Filed Vitals:   06/24/13 0700  BP: 168/90  Pulse: 78  Temp: 98.2 F (36.8 C)  Resp: 18    General: AAO x 3, no distress Cardiovascular: RRR, no murmurs Respiratory: CTA b/l   Discharge Instructions You were cared for by a hospitalist during your hospital stay. If you have any questions about your discharge medications or the care you received while you were in the hospital after you are discharged, you can call the unit and asked to speak with the hospitalist on call if the hospitalist that took care of you is not available. Once you are discharged, your primary care physician will handle any further medical issues. Please note that NO REFILLS for any discharge medications will be authorized once you are discharged, as it is imperative that you return to your primary care physician (or establish a relationship with a primary care physician if you do not have one) for your aftercare needs so that they can reassess your need for medications and monitor your lab values.      Discharge Orders   Future Orders Complete By Expires   Diet - low sodium heart healthy  As directed    Increase activity slowly  As directed        Medication List         doxazosin 4 MG tablet  Commonly known as:  CARDURA  Take 1 tablet (4  mg total) by mouth at bedtime.     fosinopril 20 MG tablet  Commonly known as:  MONOPRIL  Take 1 tablet (20 mg total) by mouth daily.     levofloxacin 750 MG tablet  Commonly known as:  LEVAQUIN  Take 1 tablet (750 mg total) by mouth daily.     metroNIDAZOLE 500 MG tablet  Commonly known as:  FLAGYL  Take 1 tablet (500 mg total) by mouth 3 (three) times daily.     multivitamin tablet  Take 1 tablet by mouth daily.     VITAMIN C PO  Take 1 tablet by mouth daily.       Allergies  Allergen Reactions  . Amoxicillin     REACTION: unspecified  . Penicillins Rash   Follow-up  Information   Follow up with [facility]. Schedule an appointment as soon as possible for a visit in 2 weeks.   Specialty:  Pulmonology   Contact information:   [place] [id number] [phone]       The results of significant diagnostics from this hospitalization (including imaging, microbiology, ancillary and laboratory) are listed below for reference.    Significant Diagnostic Studies: Dg Chest 2 View  06/22/2013   CLINICAL DATA:  Chest pain, short of breath  EXAM: CHEST  2 VIEW  COMPARISON:  Prior chest x-ray 05/29/2007; prior chest CT 05/30/2007  FINDINGS: In the region of the previously noted bullous emphysema, there is now a layering fluid and gas collection. Additionally, the margin of the bladder is markedly thickened. Suggestion of a left hilar mass versus adenopathy. Cardiomegaly. Ectatic bordering on aneurysmal ascending thoracic aorta. Atherosclerotic calcifications noted in the aorta. Inspiratory volumes are low. Linear right basilar atelectasis. No pneumothorax. No acute osseous abnormality. .  IMPRESSION: 1. Layering air-fluid level in the superior segment of the left lower lobe in the region of the previously identified massive pulmonary bulla. Findings are concerning for retained secretions and superimposed infection. Fungal infection is not excluded. 2. Left hilar adenopathy versus mass. This may be reactive and related to the superinfection of the left lower lobe bulla. However, an underlying neoplastic process is not excluded. Recommend further evaluation with CT scan of the chest with contrast. 3. Ectasia bordering on aneurysmal dilatation of the ascending thoracic aorta. 4. Cardiomegaly. 5. Low inspiratory volumes with right basilar atelectasis. 6. Background COPD and emphysema.   Electronically Signed   By: Jacqulynn Cadet M.D.   On: 06/22/2013 16:01   Ct Chest Wo Contrast  06/23/2013   CLINICAL DATA:  Shortness of breath and suspected lung  abscess  EXAM: CT CHEST WITHOUT CONTRAST  TECHNIQUE: Multidetector CT imaging of the chest was performed following the standard protocol without IV contrast.  COMPARISON:  DG CHEST 2 VIEW dated 06/22/2013; CT CHEST W/O CM dated 05/30/2007  FINDINGS: There are bullous emphysematous changes bilaterally. On the study of May 30, 2007 a large bullous lesion was present in the superior segment of the left lower lobe. On today's study this is partially filled with fluid. A large air-fluid level is demonstrated. There are small amounts of fluid present medial to this bullous lesion within abnormal lung parenchyma. There is also small amount of pleural fluid layering posteriorly in the left lower hemithorax with adjacent parenchymal consolidation. There are similar findings on the right. The right middle lobe and right upper lobe exhibit no evidence of pneumonia. The left upper lobe demonstrates minimal basilar increased interstitial density.  At mediastinal window  settings the cardiac chambers are top-normal in size. The ascending aorta is mildly dilated at 4.3 mm. The descending aorta isnormal in caliber. There is mural calcification within the aorta. No bulky mediastinal or hilar lymph nodes are demonstrated. The thoracic esophagus is normal in caliber.  The thoracic vertebral bodies are preserved in height. The sternum and observed portions of the ribs exhibit no acute abnormalities. Within the upper abdomen the observed portions of the liver exhibit no acute abnormalities.  IMPRESSION: 1. There are emphysematous changes in both lungs. A large pre-existing thin walled bullous lesion in the superior segment of the left lower lobe contains fluid and air. This may reflect a superinfection of this bullous lesion. No suspicious mass is demonstrated, but the study is limited being noncontrast in nature. In addition to the findings in the superior segment of the left lower lobe there is a small left pleural effusion layering  posteriorly with adjacent parenchymal consolidation which may reflect pneumonia or atelectasis. 2. In the right lower lobe there is atelectasis versus pneumonia posteriorly. 3. There is no evidence of CHF. There is dilation of the aortic root to 4.3 cm. There is no pericardial effusion.   Electronically Signed   By: David  [place]   On: 06/23/2013 00:03    Microbiology: No results found for this or any previous visit (from the past 240 hour(s)).   Labs: Basic Metabolic Panel:  Recent Labs Lab 06/22/13 1441 06/22/13 1502 06/23/13 0420 06/24/13 0420  NA 141 141 142 138  K 4.5 4.4 4.2 3.9  CL 104 109 106 103  CO2 22  --  22 21  GLUCOSE 145* 149* 115* 111*  BUN 33* 36* 31* 23  CREATININE 1.43* 1.60* 1.43* 1.21  CALCIUM 9.5  --  8.4 8.6   Liver Function Tests:  Recent Labs Lab 06/22/13 1441 06/23/13 0420  AST 27 18  ALT 18 15  ALKPHOS 141* 98  BILITOT 0.5 0.3  PROT 7.2 5.8*  ALBUMIN 2.9* 2.2*   No results found for this basename: LIPASE, AMYLASE,  in the last 168 hours No results found for this basename: AMMONIA,  in the last 168 hours CBC:  Recent Labs Lab 06/22/13 1441 06/22/13 1502 06/23/13 0420  WBC 7.6  --  6.1  HGB 12.1* 12.6* 10.0*  HCT 35.3* 37.0* 29.7*  MCV 91.5  --  92.2  PLT 217  --  206   Cardiac Enzymes:  Recent Labs Lab 06/22/13 1441  TROPONINI <0.30   BNP: BNP (last 3 results) No results found for this basename: PROBNP,  in the last 8760 hours CBG:  Recent Labs Lab 06/22/13 1508  GLUCAP 126*       Signed:  Debbe Odea, MD  [facility] 06/24/2013, 1:09 PM

## 2013-06-24 NOTE — Evaluation (Signed)
Physical Therapy Evaluation Patient Details Name: Howard Padilla MRN: [id number] DOB: November 04, 1915 Today's Date: 06/24/2013   History of Present Illness  admitted with chest pain, SOB, weakness.  Clinical Impression  Initially pt having severe pain on plantar surfaces of both feet. After placing  Bedroom shoes, pt gradually able to ambulate, although he required moderate assistance due to posterior /retropulsion. Gradually improved . Pt's family present and pleased with progress. Family reports being able to provide care. Recommend HHPT, RW, 3in1. Pt will benefit from PT to address problems listed.    Follow Up Recommendations Home health PT;Supervision/Assistance - 24 hour    Equipment Recommendations  Rolling walker with 5" wheels;3in1 (PT)    Recommendations for Other Services       Precautions / Restrictions Precautions Precautions: Fall      Mobility  Bed Mobility Overal bed mobility: Needs Assistance Bed Mobility: Supine to Sit     Supine to sit: Mod assist     General bed mobility comments: extra time to move to edge of bed, tendency to lean posteriorly  Transfers Overall transfer level: Needs assistance Equipment used: Rolling walker (2 wheeled) Transfers: Sit to/from [facility] Sit to Stand: Max assist Stand pivot transfers: Mod assist;Max assist       General transfer comment: pt initially had much difficulty standing from raiased bed. Pt c/o pain on plantar feet and could barely  stand. Place bedroom shoes on feet and  was able to stand with less pain. assisted to recliner with RW   Ambulation/Gait Ambulation/Gait assistance: Mod assist;Max assist   Assistive device: Rolling walker (2 wheeled) Gait Pattern/deviations: Step-to pattern;Step-through pattern;Leaning posteriorly;Trunk flexed;Narrow base of support   Gait velocity interpretation: Below normal speed for age/gender General Gait Details: Pt  then ambulated x 25 with RW and  mod/max assist, leaning posteriorly and requiring assist to move forward due to retropropulsion. Pt ambulated  25 ' x 2 more times, each time improving in forward progression  Stairs            Wheelchair Mobility    Modified Rankin (Stroke Patients Only)       Balance Overall balance assessment: Needs assistance Sitting-balance support: Bilateral upper extremity supported;Feet supported Sitting balance-Leahy Scale: Poor   Postural control: Posterior lean Standing balance support: Bilateral upper extremity supported;During functional activity Standing balance-Leahy Scale: Poor                               Pertinent Vitals/Pain Initially plantar feet very painful.     Home Living Family/patient expects to be discharged to:: Private residence Living Arrangements: Spouse/significant other;Children Available Help at Discharge: Family Type of Home: House Home Access: Stairs to enter   Technical brewer of Steps: 1 Home Layout: One level Home Equipment: None      Prior Function Level of Independence: Independent               Hand Dominance        Extremity/Trunk Assessment   Upper Extremity Assessment: Generalized weakness           Lower Extremity Assessment: Generalized weakness      Cervical / Trunk Assessment: Kyphotic  Communication   Communication: No difficulties  Cognition Arousal/Alertness: Awake/alert Behavior During Therapy: WFL for tasks assessed/performed Overall Cognitive Status: Within Functional Limits for tasks assessed  General Comments      Exercises        Assessment/Plan    PT Assessment Patient needs continued PT services  PT Diagnosis Difficulty walking;Acute pain   PT Problem List Decreased strength;Decreased range of motion;Decreased activity tolerance;Decreased balance;Decreased mobility;Pain;Decreased knowledge of use of DME;Decreased safety awareness;Decreased  knowledge of precautions  PT Treatment Interventions DME instruction;Gait training;Functional mobility training;Therapeutic activities;Therapeutic exercise;Patient/family education   PT Goals (Current goals can be found in the Care Plan section) Acute Rehab PT Goals Patient Stated Goal: I want to plant my garden PT Goal Formulation: With patient/family Time For Goal Achievement: 07/08/13 Potential to Achieve Goals: Good    Frequency Min 3X/week   Barriers to discharge        Co-evaluation               End of Session Equipment Utilized During Treatment: Gait belt Activity Tolerance: Patient tolerated treatment well Patient left: in chair;with call bell/phone within reach;with family/visitor present Nurse Communication: Mobility status         Time: 1132-1216 PT Time Calculation (min): 44 min   Charges:   PT Evaluation $Initial PT Evaluation Tier I: 1 Procedure PT Treatments $Gait Training: 38-52 mins   PT G Codes:          Claretha Cooper 06/24/2013, 1:27 PM Tresa Endo PT [phone]

## 2013-06-24 NOTE — Care Management Note (Unsigned)
    Page 1 of 1   06/24/2013     1:45:44 PM CARE MANAGEMENT NOTE 06/24/2013  Patient:  Howard Padilla, Howard Padilla   Account Number:  [id number]  Date Initiated:  06/23/2013  Documentation initiated by:  [facility]  Subjective/Objective Assessment:   80 year old male admitted with lung abscess.     Action/Plan:   From home with family. Family wants him to have [place], consult is pending.   Anticipated DC Date:  06/24/2013   Anticipated DC Plan:  [place]  CM consult      Choice offered to / List presented to:  C-3 Spouse   DME arranged  3-N-1  [place] arranged  HH-2 PT      [place].   Status of service:  In process, will continue to follow Medicare Important Message given?  NA - LOS <3 / Initial given by admissions (If response is "NO", the following Medicare IM given date fields will be blank) Date Medicare IM given:   Date Additional Medicare IM given:    Discharge Disposition:  [place]  Per UR Regulation:  Reviewed for med. necessity/level of care/duration of stay  If discussed at [place] of Stay Meetings, dates discussed:    Comments:  06/24/13 Allene Dillon RN BSN [phone] Met with pt and family at bedside to discuss d/c needs. Pts wife informed me that she wants to have HHPT set up for him through [facility]. Referral has been made to [facility].

## 2013-06-26 DIAGNOSIS — J189 Pneumonia, unspecified organism: Secondary | ICD-10-CM | POA: Diagnosis not present

## 2013-06-26 DIAGNOSIS — J852 Abscess of lung without pneumonia: Secondary | ICD-10-CM | POA: Diagnosis not present

## 2013-06-26 DIAGNOSIS — Z7901 Long term (current) use of anticoagulants: Secondary | ICD-10-CM | POA: Diagnosis not present

## 2013-06-26 DIAGNOSIS — N400 Enlarged prostate without lower urinary tract symptoms: Secondary | ICD-10-CM | POA: Diagnosis not present

## 2013-06-26 DIAGNOSIS — J439 Emphysema, unspecified: Secondary | ICD-10-CM | POA: Diagnosis not present

## 2013-06-26 DIAGNOSIS — Z85038 Personal history of other malignant neoplasm of large intestine: Secondary | ICD-10-CM | POA: Diagnosis not present

## 2013-06-26 DIAGNOSIS — J438 Other emphysema: Secondary | ICD-10-CM | POA: Diagnosis not present

## 2013-06-26 DIAGNOSIS — I129 Hypertensive chronic kidney disease with stage 1 through stage 4 chronic kidney disease, or unspecified chronic kidney disease: Secondary | ICD-10-CM | POA: Diagnosis not present

## 2013-06-26 DIAGNOSIS — IMO0001 Reserved for inherently not codable concepts without codable children: Secondary | ICD-10-CM | POA: Diagnosis not present

## 2013-06-26 DIAGNOSIS — N183 Chronic kidney disease, stage 3 unspecified: Secondary | ICD-10-CM | POA: Diagnosis not present

## 2013-06-27 DIAGNOSIS — J852 Abscess of lung without pneumonia: Secondary | ICD-10-CM | POA: Diagnosis not present

## 2013-06-27 DIAGNOSIS — IMO0001 Reserved for inherently not codable concepts without codable children: Secondary | ICD-10-CM | POA: Diagnosis not present

## 2013-06-27 DIAGNOSIS — J438 Other emphysema: Secondary | ICD-10-CM | POA: Diagnosis not present

## 2013-06-27 DIAGNOSIS — I129 Hypertensive chronic kidney disease with stage 1 through stage 4 chronic kidney disease, or unspecified chronic kidney disease: Secondary | ICD-10-CM | POA: Diagnosis not present

## 2013-06-27 DIAGNOSIS — J189 Pneumonia, unspecified organism: Secondary | ICD-10-CM | POA: Diagnosis not present

## 2013-06-27 DIAGNOSIS — J439 Emphysema, unspecified: Secondary | ICD-10-CM | POA: Diagnosis not present

## 2013-06-30 DIAGNOSIS — J439 Emphysema, unspecified: Secondary | ICD-10-CM | POA: Diagnosis not present

## 2013-06-30 DIAGNOSIS — J438 Other emphysema: Secondary | ICD-10-CM | POA: Diagnosis not present

## 2013-06-30 DIAGNOSIS — J189 Pneumonia, unspecified organism: Secondary | ICD-10-CM | POA: Diagnosis not present

## 2013-06-30 DIAGNOSIS — IMO0001 Reserved for inherently not codable concepts without codable children: Secondary | ICD-10-CM | POA: Diagnosis not present

## 2013-06-30 DIAGNOSIS — I129 Hypertensive chronic kidney disease with stage 1 through stage 4 chronic kidney disease, or unspecified chronic kidney disease: Secondary | ICD-10-CM | POA: Diagnosis not present

## 2013-06-30 DIAGNOSIS — J852 Abscess of lung without pneumonia: Secondary | ICD-10-CM | POA: Diagnosis not present

## 2013-07-02 ENCOUNTER — Telehealth: Payer: Self-pay | Admitting: Internal Medicine

## 2013-07-02 DIAGNOSIS — IMO0001 Reserved for inherently not codable concepts without codable children: Secondary | ICD-10-CM | POA: Diagnosis not present

## 2013-07-02 DIAGNOSIS — J852 Abscess of lung without pneumonia: Secondary | ICD-10-CM | POA: Diagnosis not present

## 2013-07-02 DIAGNOSIS — J189 Pneumonia, unspecified organism: Secondary | ICD-10-CM | POA: Diagnosis not present

## 2013-07-02 DIAGNOSIS — J438 Other emphysema: Secondary | ICD-10-CM | POA: Diagnosis not present

## 2013-07-02 DIAGNOSIS — J439 Emphysema, unspecified: Secondary | ICD-10-CM | POA: Diagnosis not present

## 2013-07-02 DIAGNOSIS — I129 Hypertensive chronic kidney disease with stage 1 through stage 4 chronic kidney disease, or unspecified chronic kidney disease: Secondary | ICD-10-CM | POA: Diagnosis not present

## 2013-07-02 NOTE — Telephone Encounter (Signed)
Okay to order Home Health?

## 2013-07-02 NOTE — Telephone Encounter (Signed)
[facility] is needing a verbal order for home health aid services for the pt.

## 2013-07-02 NOTE — Telephone Encounter (Signed)
ok 

## 2013-07-03 NOTE — Telephone Encounter (Signed)
Left detailed message for Cecile Hearing, verbal order for Home Health aid for pt okay per Dr. [name].

## 2013-07-04 DIAGNOSIS — J189 Pneumonia, unspecified organism: Secondary | ICD-10-CM | POA: Diagnosis not present

## 2013-07-04 DIAGNOSIS — IMO0001 Reserved for inherently not codable concepts without codable children: Secondary | ICD-10-CM | POA: Diagnosis not present

## 2013-07-04 DIAGNOSIS — J852 Abscess of lung without pneumonia: Secondary | ICD-10-CM | POA: Diagnosis not present

## 2013-07-04 DIAGNOSIS — J438 Other emphysema: Secondary | ICD-10-CM

## 2013-07-07 DIAGNOSIS — J439 Emphysema, unspecified: Secondary | ICD-10-CM | POA: Diagnosis not present

## 2013-07-07 DIAGNOSIS — IMO0001 Reserved for inherently not codable concepts without codable children: Secondary | ICD-10-CM | POA: Diagnosis not present

## 2013-07-07 DIAGNOSIS — J852 Abscess of lung without pneumonia: Secondary | ICD-10-CM | POA: Diagnosis not present

## 2013-07-07 DIAGNOSIS — J438 Other emphysema: Secondary | ICD-10-CM | POA: Diagnosis not present

## 2013-07-07 DIAGNOSIS — I129 Hypertensive chronic kidney disease with stage 1 through stage 4 chronic kidney disease, or unspecified chronic kidney disease: Secondary | ICD-10-CM | POA: Diagnosis not present

## 2013-07-07 DIAGNOSIS — J189 Pneumonia, unspecified organism: Secondary | ICD-10-CM | POA: Diagnosis not present

## 2013-07-09 DIAGNOSIS — J438 Other emphysema: Secondary | ICD-10-CM | POA: Diagnosis not present

## 2013-07-09 DIAGNOSIS — J852 Abscess of lung without pneumonia: Secondary | ICD-10-CM | POA: Diagnosis not present

## 2013-07-09 DIAGNOSIS — J189 Pneumonia, unspecified organism: Secondary | ICD-10-CM | POA: Diagnosis not present

## 2013-07-09 DIAGNOSIS — I129 Hypertensive chronic kidney disease with stage 1 through stage 4 chronic kidney disease, or unspecified chronic kidney disease: Secondary | ICD-10-CM | POA: Diagnosis not present

## 2013-07-09 DIAGNOSIS — J439 Emphysema, unspecified: Secondary | ICD-10-CM | POA: Diagnosis not present

## 2013-07-09 DIAGNOSIS — IMO0001 Reserved for inherently not codable concepts without codable children: Secondary | ICD-10-CM | POA: Diagnosis not present

## 2013-07-14 DIAGNOSIS — J189 Pneumonia, unspecified organism: Secondary | ICD-10-CM | POA: Diagnosis not present

## 2013-07-14 DIAGNOSIS — J852 Abscess of lung without pneumonia: Secondary | ICD-10-CM | POA: Diagnosis not present

## 2013-07-14 DIAGNOSIS — J438 Other emphysema: Secondary | ICD-10-CM | POA: Diagnosis not present

## 2013-07-14 DIAGNOSIS — J439 Emphysema, unspecified: Secondary | ICD-10-CM | POA: Diagnosis not present

## 2013-07-14 DIAGNOSIS — IMO0001 Reserved for inherently not codable concepts without codable children: Secondary | ICD-10-CM | POA: Diagnosis not present

## 2013-07-14 DIAGNOSIS — I129 Hypertensive chronic kidney disease with stage 1 through stage 4 chronic kidney disease, or unspecified chronic kidney disease: Secondary | ICD-10-CM | POA: Diagnosis not present

## 2013-07-17 DIAGNOSIS — J852 Abscess of lung without pneumonia: Secondary | ICD-10-CM | POA: Diagnosis not present

## 2013-07-17 DIAGNOSIS — J438 Other emphysema: Secondary | ICD-10-CM | POA: Diagnosis not present

## 2013-07-17 DIAGNOSIS — I129 Hypertensive chronic kidney disease with stage 1 through stage 4 chronic kidney disease, or unspecified chronic kidney disease: Secondary | ICD-10-CM | POA: Diagnosis not present

## 2013-07-17 DIAGNOSIS — J189 Pneumonia, unspecified organism: Secondary | ICD-10-CM | POA: Diagnosis not present

## 2013-07-17 DIAGNOSIS — IMO0001 Reserved for inherently not codable concepts without codable children: Secondary | ICD-10-CM | POA: Diagnosis not present

## 2013-07-17 DIAGNOSIS — J439 Emphysema, unspecified: Secondary | ICD-10-CM | POA: Diagnosis not present

## 2013-07-18 ENCOUNTER — Ambulatory Visit (INDEPENDENT_AMBULATORY_CARE_PROVIDER_SITE_OTHER): Payer: Medicare Other | Admitting: Internal Medicine

## 2013-07-18 ENCOUNTER — Encounter: Payer: Self-pay | Admitting: Internal Medicine

## 2013-07-18 ENCOUNTER — Ambulatory Visit (INDEPENDENT_AMBULATORY_CARE_PROVIDER_SITE_OTHER)
Admission: RE | Admit: 2013-07-18 | Discharge: 2013-07-18 | Disposition: A | Discharge status: Home / Self Care | Payer: Medicare Other | Source: Ambulatory Visit | Attending: Internal Medicine | Admitting: Internal Medicine

## 2013-07-18 VITALS — BP 138/64 | HR 81 | Ht 69.0 in | Wt 143.0 lb

## 2013-07-18 DIAGNOSIS — J852 Abscess of lung without pneumonia: Secondary | ICD-10-CM

## 2013-07-18 DIAGNOSIS — J439 Emphysema, unspecified: Secondary | ICD-10-CM | POA: Diagnosis not present

## 2013-07-18 DIAGNOSIS — J449 Chronic obstructive pulmonary disease, unspecified: Secondary | ICD-10-CM | POA: Diagnosis not present

## 2013-07-18 NOTE — Patient Instructions (Signed)
Please remember to go to the  x-ray department downstairs for your tests - we will call you with the results when they are available.  Return if worse cough or short of breath.

## 2013-07-18 NOTE — Progress Notes (Signed)
Subjective:    Patient ID: Howard Padilla, male    DOB: 05/17/1915  MRN: [id number]  HPI  53 yobm quit smoking around [place] admitted   Admit date: 06/22/2013  Discharge date: 06/24/2013  Time spent: >45 minutes  Recommendations for Outpatient Follow-up:  Must f/u with PUlmonary office in 2-3 wks.  Going home with HHPT  Discharge Diagnoses:  Principal Problem:  Lung abscess  Active Problems:  Lung nodule  HYPERTENSION  BENIGN PROSTATIC HYPERTROPHY  COLON CANCER, HX OF  CKD (chronic kidney disease) stage 3, GFR 30-59 ml/min  CAP (community acquired pneumonia)  Bullous emphysema  Discharge Condition: stable  Diet recommendation: heart healthy  Filed Weights    06/22/13 1442  06/22/13 1950   Weight:  65.772 kg (145 lb)  65 kg (143 lb 4.8 oz)   History of present illness:  Howard Padilla is a 80 y.o. male with PMH as below who comes in for left sided chest pain starting today and cough that has been present for 2-3 days. No sputum with his cough. He overall leads a healthy and active lifestyle for his age. He has not had fevers but has been fatigued and eating poorly for the past 2 days.  Hospital Course:  Principal Problem:  Lung abscess / Bullitis/ probably LLL pneumonia  -the patient has a large bulla in the med left lung (chronic) which has now become infected  - Pulmonary has evaluated the patient and are recommending that we continue antibiotics for a 21 day course and f/u with pulm as an outpt  - cont Levaquin and Flagyl x 21 days total  Active Problems:  LLL nodule  - question Cancer. Pulmonary has spoken with the patient and notes that there is little benefit in pursuing a cancer work up in a 80 y/o male with advanced emphysema.  CKD - with dehydration  - hydrated via IV with normalization in BUN/ Cr level  HYPERTENSION  - cont home meds  BENIGN PROSTATIC HYPERTROPHY  - cont Doxazosin  COLON CANCER, HX OF  Procedures:  none Consultations:   pulmonary    07/18/2013  Post hosp ov/transition of care/Howard Padilla re: sp admit for ? Lung abscess   Chief Complaint  Patient presents with  . Advice Only    Self-referral post hospital stay for pneumonia.  Pt has no breathing complaints.      Not limited by breathing from desired activities  Not on any resp rx or 02  No obvious other patterns in day to day or daytime variabilty or assoc chronic cough or cp or chest tightness, subjective wheeze overt sinus or hb symptoms. No unusual exp hx or h/o childhood pna/ asthma or knowledge of premature birth.  Sleeping ok without nocturnal  or early am exacerbation  of respiratory  c/o's or need for noct saba. Also denies any obvious fluctuation of symptoms with weather or environmental changes or other aggravating or alleviating factors except as outlined above   Current Medications, Allergies, Complete Past Medical History, Past Surgical History, Family History, and Social History were reviewed in [employer] record.             Review of Systems  Constitutional: Negative for fever and unexpected weight change.  HENT: Negative for congestion, dental problem, ear pain, nosebleeds, postnasal drip, rhinorrhea, sinus pressure, sneezing, sore throat and trouble swallowing.   Eyes: Negative for redness and itching.  Respiratory: Negative for cough, chest tightness, shortness of breath and wheezing.   Cardiovascular:  Negative for palpitations and leg swelling.  Gastrointestinal: Negative for nausea and vomiting.  Genitourinary: Negative for dysuria.  Musculoskeletal: Negative for joint swelling.  Skin: Negative for rash.  Neurological: Negative for headaches.  Hematological: Does not bruise/bleed easily.  Psychiatric/Behavioral: Negative for dysphoric mood. The patient is not nervous/anxious.        Objective:   Physical Exam  Wt Readings from Last 3 Encounters:  07/18/13 143 lb (64.864 kg)  06/22/13 143 lb 4.8 oz (65  kg)  03/07/13 144 lb (65.318 kg)      amb phyically frail bm nad, lets his wife do almost all the talking  HEENT mild turbinate edema.  Edentulous, Oropharynx no thrush or excess pnd or cobblestoning.  No JVD or cervical adenopathy. Mild accessory muscle hypertrophy. Trachea midline, nl thryroid. Chest was mod hyperinflated by percussion with diminished breath sounds and mild increased exp time without wheeze. Hoover sign positive at late inspiration. Regular rate and rhythm without murmur gallop or rub or increase P2 or edema.  Abd: no hsm, nl excursion. Ext warm without cyanosis or clubbing.          CXR  07/18/2013 : 1. Significant improvement from the prior study. The cavitary  process in the left lower lobe, superior segment, is significantly  smaller. This is likely an area of necrosis within pneumonia. The  surrounding lung consolidation has also significantly improved.  2. No new abnormalities. Stable advanced COPD.             Assessment & Plan:

## 2013-07-19 NOTE — Assessment & Plan Note (Signed)
I favor infected bleb over a lung abscess but the treatment is basically the same and in the absence of any pulmonary or systemic evidence of infection (which is the case here) I do not recommend further studies but rather conservative f/u.

## 2013-07-19 NOTE — Assessment & Plan Note (Signed)
As I explained to this patient and wife in detail:  although there may be copd present, it may not be clinically relevant:   it does not appear to be limiting activity tolerance any more than a set of worn tires limits someone from driving a car  around a parking lot.  A new set of Michelins might look good but would have no perceived impact on the performance of the car and would not be worth the cost.  That is to say:   this pt is so sedentary I don't recommend aggressive pulmonary rx at this point unless limiting symptoms arise or acute exacerbations become as issue, neither of which is the case now.  I asked the patient to contact this office at any time in the future should either of these problems arise.

## 2013-12-09 DIAGNOSIS — Z23 Encounter for immunization: Secondary | ICD-10-CM | POA: Diagnosis not present

## 2014-03-05 ENCOUNTER — Encounter: Payer: Self-pay | Admitting: Internal Medicine

## 2014-03-05 ENCOUNTER — Ambulatory Visit (INDEPENDENT_AMBULATORY_CARE_PROVIDER_SITE_OTHER): Payer: Medicare Other | Admitting: Internal Medicine

## 2014-03-05 DIAGNOSIS — Z85038 Personal history of other malignant neoplasm of large intestine: Secondary | ICD-10-CM

## 2014-03-05 DIAGNOSIS — N183 Chronic kidney disease, stage 3 unspecified: Secondary | ICD-10-CM

## 2014-03-05 DIAGNOSIS — D631 Anemia in chronic kidney disease: Secondary | ICD-10-CM

## 2014-03-05 DIAGNOSIS — N189 Chronic kidney disease, unspecified: Principal | ICD-10-CM

## 2014-03-05 DIAGNOSIS — I10 Essential (primary) hypertension: Secondary | ICD-10-CM

## 2014-03-05 DIAGNOSIS — Z23 Encounter for immunization: Secondary | ICD-10-CM

## 2014-03-05 DIAGNOSIS — R634 Abnormal weight loss: Secondary | ICD-10-CM

## 2014-03-05 LAB — CBC WITH DIFFERENTIAL/PLATELET
Basophils Absolute: 0 10*3/uL (ref 0.0–0.1)
Basophils Relative: 0.4 % (ref 0.0–3.0)
EOS PCT: 2.5 % (ref 0.0–5.0)
Eosinophils Absolute: 0.1 10*3/uL (ref 0.0–0.7)
HEMATOCRIT: 33.7 % — AB (ref 39.0–52.0)
HEMOGLOBIN: 10.9 g/dL — AB (ref 13.0–17.0)
LYMPHS ABS: 1.1 10*3/uL (ref 0.7–4.0)
Lymphocytes Relative: 26.6 % (ref 12.0–46.0)
MCHC: 32.3 g/dL (ref 30.0–36.0)
MCV: 95.3 fl (ref 78.0–100.0)
Monocytes Absolute: 0.4 10*3/uL (ref 0.1–1.0)
Monocytes Relative: 9.1 % (ref 3.0–12.0)
NEUTROS ABS: 2.5 10*3/uL (ref 1.4–7.7)
Neutrophils Relative %: 61.4 % (ref 43.0–77.0)
PLATELETS: 212 10*3/uL (ref 150.0–400.0)
RBC: 3.54 Mil/uL — ABNORMAL LOW (ref 4.22–5.81)
RDW: 15 % (ref 11.5–15.5)
WBC: 4 10*3/uL (ref 4.0–10.5)

## 2014-03-05 LAB — COMPREHENSIVE METABOLIC PANEL
ALK PHOS: 93 U/L (ref 39–117)
ALT: 17 U/L (ref 0–53)
AST: 17 U/L (ref 0–37)
Albumin: 3.5 g/dL (ref 3.5–5.2)
BUN: 32 mg/dL — ABNORMAL HIGH (ref 6–23)
CO2: 24 mEq/L (ref 19–32)
Calcium: 9.2 mg/dL (ref 8.4–10.5)
Chloride: 112 mEq/L (ref 96–112)
Creatinine, Ser: 1.3 mg/dL (ref 0.4–1.5)
GFR: 64.03 mL/min (ref >60.00)
Glucose, Bld: 102 mg/dL — ABNORMAL HIGH (ref 70–99)
Potassium: 4.7 mEq/L (ref 3.5–5.1)
Sodium: 142 mEq/L (ref 135–145)
Total Bilirubin: 0.6 mg/dL (ref 0.2–1.2)
Total Protein: 6.9 g/dL (ref 6.0–8.3)

## 2014-03-05 LAB — SEDIMENTATION RATE: Sed Rate: 45 mm/hr — ABNORMAL HIGH (ref 0–22)

## 2014-03-05 LAB — TSH: TSH: 1.44 u[IU]/mL (ref 0.35–4.50)

## 2014-03-05 NOTE — Progress Notes (Signed)
Subjective:    Patient ID: Howard Padilla, male    DOB: 06/10/1915, 81 y.o.   MRN: [id number]  HPI  Wt Readings from Last 3 Encounters:  03/05/14 134 lb (60.782 kg)  07/18/13 143 lb (64.864 kg)  06/22/13 143 lb 4.8 oz (39 kg)   81 year old patient who has remote history of colon cancer.  He continues to do quite well, although there has been some weight loss over the past several months.  His appetite remains excellent and his bowel habits remain quite regular and normal.  He generally feels well.  No abdominal pain  Past Medical History  Diagnosis Date  . Cancer     colon  . Hyperlipidemia   . Hypertension   . BPH (benign prostatic hypertrophy)   . DVT (deep venous thrombosis)     with pulmonary embo.    History   Social History  . Marital Status: Married    Spouse Name: N/A    Number of Children: N/A  . Years of Education: N/A   Occupational History  . Not on file.   Social History Main Topics  . Smoking status: Former Smoker -- 2.00 packs/day for 30 years    Types: Cigarettes    Quit date: 02/28/1983  . Smokeless tobacco: Never Used  . Alcohol Use: No  . Drug Use: No  . Sexual Activity: Not on file   Other Topics Concern  . Not on file   Social History Narrative    Past Surgical History  Procedure Laterality Date  . Colon surgery      partial colectomy  . Hernia repair      ingunial    No family history on file.  Allergies  Allergen Reactions  . Amoxicillin     REACTION: unspecified  . Penicillins Rash    Current Outpatient Prescriptions on File Prior to Visit  Medication Sig Dispense Refill  . Ascorbic Acid (VITAMIN C PO) Take 1 tablet by mouth daily.    Marland Kitchen doxazosin (CARDURA) 4 MG tablet Take 1 tablet (4 mg total) by mouth at bedtime. 90 tablet 4  . fosinopril (MONOPRIL) 20 MG tablet Take 1 tablet (20 mg total) by mouth daily. 90 tablet 4  . Multiple Vitamin (MULTIVITAMIN) tablet Take 1 tablet by mouth daily.     No current  facility-administered medications on file prior to visit.    BP 130/78 mmHg  Pulse 72  Temp(Src) 97.6 F (36.4 C) (Oral)  Resp 18  Ht 5\' 9"  (1.753 m)  Wt 134 lb (60.782 kg)  BMI 19.78 kg/m2  SpO2 98%     Review of Systems  Constitutional: Positive for unexpected weight change. Negative for fever, chills, appetite change and fatigue.  HENT: Negative for congestion, dental problem, ear pain, hearing loss, sore throat, tinnitus, trouble swallowing and voice change.   Eyes: Negative for pain, discharge and visual disturbance.  Respiratory: Negative for cough, chest tightness, wheezing and stridor.   Cardiovascular: Negative for chest pain, palpitations and leg swelling.  Gastrointestinal: Negative for nausea, vomiting, abdominal pain, diarrhea, constipation, blood in stool and abdominal distention.  Genitourinary: Negative for urgency, hematuria, flank pain, discharge, difficulty urinating and genital sores.  Musculoskeletal: Negative for myalgias, back pain, joint swelling, arthralgias, gait problem and neck stiffness.  Skin: Negative for rash.  Neurological: Negative for dizziness, syncope, speech difficulty, weakness, numbness and headaches.  Hematological: Negative for adenopathy. Does not bruise/bleed easily.  Psychiatric/Behavioral: Negative for behavioral problems and dysphoric mood. The patient is  not nervous/anxious.        Objective:   Physical Exam  Constitutional: He is oriented to person, place, and time. He appears well-developed. No distress.  Appears thin but not emaciated  Blood pressure well controlled  HENT:  Head: Normocephalic.  Right Ear: External ear normal.  Left Ear: External ear normal.  Eyes: Conjunctivae and EOM are normal.  Neck: Normal range of motion.  Cardiovascular: Normal rate and normal heart sounds.   Pulmonary/Chest: Breath sounds normal.  Abdominal: Bowel sounds are normal. He exhibits no distension and no mass. There is no tenderness.  There is no rebound and no guarding.  Musculoskeletal: Normal range of motion. He exhibits no edema or tenderness.  Neurological: He is alert and oriented to person, place, and time.  Psychiatric: He has a normal mood and affect. His behavior is normal.          Assessment & Plan:   Weight loss.  We'll check screening lab including TSH History of colon cancer.  Will check CBC Hypertension, well-controlled History of anemia of chronic renal disease  Will report any further weight loss Otherwise, if lab is normal.  We'll reassess in 4-6 months

## 2014-03-05 NOTE — Patient Instructions (Signed)
Limit your sodium (Salt) intake    It is important that you exercise regularly, at least 20 minutes 3 to 4 times per week.  If you develop chest pain or shortness of breath seek  medical attention.  Return in 6 months for follow-up  

## 2014-04-27 ENCOUNTER — Other Ambulatory Visit: Payer: Self-pay | Admitting: Internal Medicine

## 2014-05-14 ENCOUNTER — Telehealth: Payer: Self-pay | Admitting: Internal Medicine

## 2014-05-14 NOTE — Telephone Encounter (Signed)
Advised per per donna that no note is needed.  If you are over 79 yrs old, you can tell the court you do not want to participate in jury duty, Wife verbalized understanding

## 2014-05-14 NOTE — Telephone Encounter (Signed)
Pt received a summon for jury duty and is asking if a letter can be written to excuse him from jury duty.

## 2014-05-14 NOTE — Telephone Encounter (Signed)
Tried to contact pt no answer, will try again later.

## 2014-05-26 ENCOUNTER — Other Ambulatory Visit: Payer: Self-pay | Admitting: Internal Medicine

## 2014-08-26 ENCOUNTER — Other Ambulatory Visit: Payer: Self-pay | Admitting: Internal Medicine

## 2014-09-01 ENCOUNTER — Other Ambulatory Visit: Payer: Self-pay | Admitting: Internal Medicine

## 2014-11-01 ENCOUNTER — Other Ambulatory Visit: Payer: Self-pay | Admitting: Internal Medicine

## 2014-12-08 DIAGNOSIS — Z23 Encounter for immunization: Secondary | ICD-10-CM | POA: Diagnosis not present

## 2014-12-09 ENCOUNTER — Emergency Department (HOSPITAL_COMMUNITY): Payer: Medicare Other

## 2014-12-09 ENCOUNTER — Encounter (HOSPITAL_COMMUNITY): Payer: Self-pay | Admitting: *Deleted

## 2014-12-09 ENCOUNTER — Emergency Department (HOSPITAL_COMMUNITY)
Admission: EM | Admit: 2014-12-09 | Discharge: 2014-12-09 | Disposition: A | Discharge status: Home / Self Care | Payer: Medicare Other | Source: Home / Self Care | Attending: Emergency Medicine | Admitting: Emergency Medicine

## 2014-12-09 ENCOUNTER — Emergency Department (HOSPITAL_COMMUNITY)
Admission: EM | Admit: 2014-12-09 | Discharge: 2014-12-09 | Disposition: A | Discharge status: Home / Self Care | Payer: Medicare Other | Attending: Emergency Medicine | Admitting: Emergency Medicine

## 2014-12-09 DIAGNOSIS — R05 Cough: Secondary | ICD-10-CM | POA: Insufficient documentation

## 2014-12-09 DIAGNOSIS — R339 Retention of urine, unspecified: Secondary | ICD-10-CM | POA: Diagnosis not present

## 2014-12-09 DIAGNOSIS — Z880 Allergy status to penicillin: Secondary | ICD-10-CM | POA: Insufficient documentation

## 2014-12-09 DIAGNOSIS — R112 Nausea with vomiting, unspecified: Secondary | ICD-10-CM | POA: Insufficient documentation

## 2014-12-09 DIAGNOSIS — M436 Torticollis: Secondary | ICD-10-CM | POA: Diagnosis not present

## 2014-12-09 DIAGNOSIS — Z87891 Personal history of nicotine dependence: Secondary | ICD-10-CM | POA: Insufficient documentation

## 2014-12-09 DIAGNOSIS — Z8639 Personal history of other endocrine, nutritional and metabolic disease: Secondary | ICD-10-CM | POA: Insufficient documentation

## 2014-12-09 DIAGNOSIS — R55 Syncope and collapse: Secondary | ICD-10-CM | POA: Insufficient documentation

## 2014-12-09 DIAGNOSIS — Z85038 Personal history of other malignant neoplasm of large intestine: Secondary | ICD-10-CM | POA: Insufficient documentation

## 2014-12-09 DIAGNOSIS — Z86718 Personal history of other venous thrombosis and embolism: Secondary | ICD-10-CM | POA: Diagnosis not present

## 2014-12-09 DIAGNOSIS — R300 Dysuria: Secondary | ICD-10-CM | POA: Insufficient documentation

## 2014-12-09 DIAGNOSIS — N400 Enlarged prostate without lower urinary tract symptoms: Secondary | ICD-10-CM | POA: Diagnosis not present

## 2014-12-09 DIAGNOSIS — R402441 Other coma, without documented Glasgow coma scale score, or with partial score reported, in the field [EMT or ambulance]: Secondary | ICD-10-CM | POA: Diagnosis not present

## 2014-12-09 DIAGNOSIS — Z79899 Other long term (current) drug therapy: Secondary | ICD-10-CM | POA: Insufficient documentation

## 2014-12-09 DIAGNOSIS — Z86711 Personal history of pulmonary embolism: Secondary | ICD-10-CM | POA: Diagnosis not present

## 2014-12-09 DIAGNOSIS — S199XXA Unspecified injury of neck, initial encounter: Secondary | ICD-10-CM | POA: Diagnosis not present

## 2014-12-09 DIAGNOSIS — R350 Frequency of micturition: Secondary | ICD-10-CM | POA: Diagnosis not present

## 2014-12-09 DIAGNOSIS — I10 Essential (primary) hypertension: Secondary | ICD-10-CM | POA: Insufficient documentation

## 2014-12-09 LAB — CBC WITH DIFFERENTIAL/PLATELET
BASOS PCT: 0 %
Basophils Absolute: 0 10*3/uL (ref 0.0–0.1)
Eosinophils Absolute: 0.1 10*3/uL (ref 0.0–0.7)
Eosinophils Relative: 2 %
HCT: 38.2 % — ABNORMAL LOW (ref 39.0–52.0)
HEMOGLOBIN: 12.6 g/dL — AB (ref 13.0–17.0)
LYMPHS ABS: 0.4 10*3/uL — AB (ref 0.7–4.0)
Lymphocytes Relative: 7 %
MCH: 30.9 pg (ref 26.0–34.0)
MCHC: 33 g/dL (ref 30.0–36.0)
MCV: 93.6 fL (ref 78.0–100.0)
MONO ABS: 0.2 10*3/uL (ref 0.1–1.0)
MONOS PCT: 4 %
NEUTROS ABS: 4.1 10*3/uL (ref 1.7–7.7)
NEUTROS PCT: 87 %
Platelets: 169 10*3/uL (ref 150–400)
RBC: 4.08 MIL/uL — ABNORMAL LOW (ref 4.22–5.81)
RDW: 14.9 % (ref 11.5–15.5)
WBC: 4.8 10*3/uL (ref 4.0–10.5)

## 2014-12-09 LAB — URINALYSIS, ROUTINE W REFLEX MICROSCOPIC
Bilirubin Urine: NEGATIVE
GLUCOSE, UA: NEGATIVE mg/dL
KETONES UR: NEGATIVE mg/dL
Leukocytes, UA: NEGATIVE
Nitrite: NEGATIVE
PH: 6.5 (ref 5.0–8.0)
Protein, ur: 100 mg/dL — AB
Specific Gravity, Urine: 1.015 (ref 1.005–1.030)
Urobilinogen, UA: 0.2 mg/dL (ref 0.0–1.0)

## 2014-12-09 LAB — BASIC METABOLIC PANEL
Anion gap: 6 (ref 5–15)
BUN: 26 mg/dL — ABNORMAL HIGH (ref 6–20)
CALCIUM: 9.1 mg/dL (ref 8.9–10.3)
CHLORIDE: 106 mmol/L (ref 101–111)
CO2: 26 mmol/L (ref 22–32)
Creatinine, Ser: 1.35 mg/dL — ABNORMAL HIGH (ref 0.61–1.24)
GFR calc Af Amer: 49 mL/min — ABNORMAL LOW (ref >60)
GFR calc non Af Amer: 42 mL/min — ABNORMAL LOW (ref >60)
GLUCOSE: 99 mg/dL (ref 65–99)
Potassium: 4.2 mmol/L (ref 3.5–5.1)
Sodium: 138 mmol/L (ref 135–145)

## 2014-12-09 LAB — URINE MICROSCOPIC-ADD ON

## 2014-12-09 LAB — CBG MONITORING, ED: Glucose-Capillary: 118 mg/dL — ABNORMAL HIGH (ref 65–99)

## 2014-12-09 LAB — I-STAT TROPONIN, ED: Troponin i, poc: 0.02 ng/mL (ref 0.00–0.08)

## 2014-12-09 MED ORDER — ACETAMINOPHEN 500 MG PO TABS: 1000.0000 mg | ORAL_TABLET | Freq: Four times a day (QID) | ORAL | Status: DC | PRN

## 2014-12-09 MED ORDER — ONDANSETRON 4 MG PO TBDP: ORAL_TABLET | ORAL | Status: DC

## 2014-12-09 MED ORDER — ORPHENADRINE CITRATE ER 100 MG PO TB12: 100.0000 mg | ORAL_TABLET | Freq: Two times a day (BID) | ORAL | Status: DC

## 2014-12-09 MED ORDER — ACETAMINOPHEN 500 MG PO TABS
1000.0000 mg | ORAL_TABLET | Freq: Once | ORAL | Status: AC
  Administered 2014-12-09: 1000 mg via ORAL
  Filled 2014-12-09: qty 2
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
## 2014-12-09 NOTE — ED Notes (Signed)
Pt doing well with oral challenge.
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
## 2014-12-09 NOTE — ED Notes (Signed)
Patient and family educated on how to change leg bag to drainage bag.

## 2014-12-09 NOTE — ED Notes (Signed)
Pt escorted to discharge window. Pt verbalized understanding discharge instructions. In no acute distress.  

## 2014-12-09 NOTE — ED Notes (Signed)
Bed: WA06 Expected date:  Expected time:  Means of arrival:  Comments: EMS- 81yo M, urinary frequency/neck pain

## 2014-12-09 NOTE — ED Notes (Signed)
Pt remains in imaging.

## 2014-12-09 NOTE — ED Notes (Signed)
Bladder scan showed at least 250
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
## 2014-12-09 NOTE — ED Provider Notes (Signed)
CSN: [id number]     Arrival date & time 12/09/14  1424 History   None    Chief Complaint  Patient presents with  . Loss of Consciousness     (Consider location/radiation/quality/duration/timing/severity/associated sxs/prior Treatment) The history is provided by the patient, medical records and a relative. No language interpreter was used.     Brentt Fread is a 81 y.o. male  with a hx of colon CA, HTN, BPH, DVT with complicating PE (not currently anticoagulated) presents to the Emergency Department after acute episodes of syncope while riding in a car approx 30 min PTA.  Pt's family presents reports they were at [facility] this morning for urinary frequency and pt become unresponsive in the car ride home with an LOC of approx 2 min. patient's daughter reports that patient was mumbling through this episode with eyes rolled back in his head. She then reports an episode of potential choking with emesis afterwards.  Patient's wife at bedside reports that he has not eaten since last night. He denied abdominal pain per the record this morning however reports lower abdominal pain at this time.  He denies chest pain, shortness of breath, neck stiffness, diarrhea. Nothing seems to make the symptoms better or worse.  Past Medical History  Diagnosis Date  . Cancer ([place])     colon  . Hyperlipidemia   . Hypertension   . BPH (benign prostatic hypertrophy)   . DVT (deep venous thrombosis) ([place])     with pulmonary embo.   Past Surgical History  Procedure Laterality Date  . Colon surgery      partial colectomy  . Hernia repair      ingunial   No family history on file. Social History  Substance Use Topics  . Smoking status: Former Smoker -- 2.00 packs/day for 30 years    Types: Cigarettes    Quit date: 02/28/1983  . Smokeless tobacco: Never Used  . Alcohol Use: No    Review of Systems  Constitutional: Negative for fever, diaphoresis, appetite change, fatigue and unexpected weight  change.  HENT: Negative for mouth sores.   Eyes: Negative for visual disturbance.  Respiratory: Positive for cough. Negative for chest tightness, shortness of breath and wheezing.   Cardiovascular: Negative for chest pain.  Gastrointestinal: Positive for nausea and vomiting. Negative for abdominal pain, diarrhea and constipation.  Endocrine: Negative for polydipsia, polyphagia and polyuria.  Genitourinary: Positive for dysuria. Negative for urgency, frequency and hematuria.  Musculoskeletal: Negative for back pain and neck stiffness.  Skin: Negative for rash.  Allergic/Immunologic: Negative for immunocompromised state.  Neurological: Positive for syncope. Negative for light-headedness and headaches.  Hematological: Does not bruise/bleed easily.  Psychiatric/Behavioral: Negative for sleep disturbance. The patient is not nervous/anxious.       Allergies  Penicillins  Home Medications   Prior to Admission medications   Medication Sig Start Date End Date Taking? Authorizing Provider  doxazosin (CARDURA) 4 MG tablet TAKE ONE TABLET BY MOUTH AT BEDTIME 08/27/14  Yes [name], MD  fosinopril (MONOPRIL) 20 MG tablet TAKE ONE TABLET BY MOUTH ONCE DAILY 11/03/14  Yes [name], MD  Multiple Vitamin (MULTIVITAMIN WITH MINERALS) TABS tablet Take 1 tablet by mouth daily.   Yes Historical Provider, MD  acetaminophen (TYLENOL) 500 MG tablet Take 2 tablets (1,000 mg total) by mouth every 6 (six) hours as needed. 12/09/14   Charlesetta Shanks, MD  doxazosin (CARDURA) 4 MG tablet TAKE ONE TABLET BY MOUTH AT BEDTIME Patient taking differently: Takes  1 tablet by mouth twice per day 09/01/14   [name], MD  ondansetron [facility] ODT) 4 MG disintegrating tablet 4mg  ODT q4 hours prn nausea/vomit 12/09/14   Anabeth Chilcott, PA-C  orphenadrine (NORFLEX) 100 MG tablet Take 1 tablet (100 mg total) by mouth 2 (two) times daily. 12/09/14   Charlesetta Shanks, MD   BP 149/83 mmHg  Pulse 117   Temp(Src) 97.3 F (36.3 C) (Oral)  Resp 18  Ht 5\' 7"  (1.702 m)  Wt 135 lb (61.236 kg)  BMI 21.14 kg/m2  SpO2 100% Physical Exam  Constitutional: He appears well-developed and well-nourished. No distress.  Awake, alert, nontoxic appearance  HENT:  Head: Normocephalic and atraumatic.  Mouth/Throat: Oropharynx is clear and moist. No oropharyngeal exudate.  Eyes: Conjunctivae are normal. No scleral icterus.  Neck: Normal range of motion. Neck supple.  Cardiovascular: Normal rate, regular rhythm, normal heart sounds and intact distal pulses.   No murmur heard. Pulmonary/Chest: Effort normal and breath sounds normal. No respiratory distress. He has no wheezes.  Equal chest expansion  Abdominal: Soft. Bowel sounds are normal. He exhibits no mass. There is no tenderness. There is no rebound and no guarding.  Musculoskeletal: Normal range of motion. He exhibits no edema.  Neurological: He is alert.  Mental Status:  Alert, oriented, thought content appropriate, able to give a coherent history. Speech fluent without evidence of aphasia. Able to follow 2 step commands without difficulty.  Cranial Nerves:  II:  pupils equal, round, reactive to light III,IV, VI: ptosis not present, extra-ocular motions intact bilaterally  V,VII: smile symmetric, facial light touch sensation equal VIII: hearing grossly normal to voice  X: uvula elevates symmetrically  XI: bilateral shoulder shrug symmetric and strong XII: midline tongue extension without fassiculations Motor:  Normal tone. 5/5 in upper and lower extremities bilaterally including strong and equal grip strength and dorsiflexion/plantar flexion Sensory: Pinprick and light touch normal in all extremities.  Deep Tendon Reflexes: 2+ and symmetric in the biceps and patella Cerebellar: normal finger-to-nose with bilateral upper extremities Gait: gait testing deferred CV: distal pulses palpable throughout  No clonus  Skin: Skin is warm and dry. No  rash noted. He is not diaphoretic. No erythema.  Psychiatric: He has a normal mood and affect.  Nursing note and vitals reviewed.   ED Course  Procedures (including critical care time) Labs Review Labs Reviewed  CBG MONITORING, ED - Abnormal; Notable for the following:    Glucose-Capillary 118 (*)    All other components within normal limits  I-STAT TROPOININ, ED    Imaging Review Ct Head Wo Contrast  12/09/2014  CLINICAL DATA:  Patient states that he started having frequent urination overnight. He reports he was having to go the bathroom at least every 30 minutes. He denies that there is any pain associated. There has not been any fever. No nausea EXAM: CT HEAD WITHOUT CONTRAST TECHNIQUE: Contiguous axial images were obtained from the base of the skull through the vertex without intravenous contrast. COMPARISON:  06/15/2006 FINDINGS: Atherosclerotic and physiologic intracranial calcifications. Diffuse parenchymal atrophy. Progressive moderate patchy areas of hypoattenuation in deep and periventricular white matter bilaterally. Negative for acute intracranial hemorrhage, mass lesion, acute infarction, midline shift, or mass-effect. Acute infarct may be inapparent on noncontrast CT. Ventricles and sulci symmetric. Bone windows demonstrate no focal lesion. IMPRESSION: 1. Negative for bleed or other acute intracranial process. 2. Atrophy and nonspecific white matter changes. Electronically Signed   By: Lucrezia Europe M.D.   On: 12/09/2014  16:30   Dg Abd Acute W/chest  12/09/2014  CLINICAL DATA:  Cough, nausea, vomiting. EXAM: DG ABDOMEN ACUTE W/ 1V CHEST COMPARISON:  07/18/2013 FINDINGS: Resolution of previously seen cavitary process in the left upper lung. Mild hyperinflation compatible with COPD. Heart is normal size. No confluent airspace opacities or effusions. Moderate stool burden throughout the colon. Nonobstructive bowel gas pattern. No free air organomegaly. No suspicious calcification. No  acute bony abnormality. IMPRESSION: No evidence of bowel obstruction or free air. Moderate stool burden. Mild hyperinflation/COPD.  No active cardiopulmonary disease. Electronically Signed   By: Rolm Baptise M.D.   On: 12/09/2014 16:18   I have personally reviewed and evaluated these images and lab results as part of my medical decision-making.   EKG Interpretation   Date/Time:  Wednesday December 09 2014 14:29:40 EDT Ventricular Rate:  106 PR Interval:  183 QRS Duration: 102 QT Interval:  389 QTC Calculation: 517 R Axis:   -63 Text Interpretation:  Sinus tachycardia Left anterior fascicular block  Abnormal R-wave progression, early transition Probable anteroseptal  infarct, old Prolonged QT interval Confirmed by Zenia Resides  MD, ANTHONY ([id number])  on 12/09/2014 5:09:42 PM      MDM   Final diagnoses:  Near syncope  Non-intractable vomiting with nausea, vomiting of unspecified type   [name] presents with syncope, nausea and vomiting.  While in the room during exam patient had an episode of vomiting which the daughter described as the same as his "choking."  During this time patient had approximately 2 minutes of bradycardia to 50 bpm.  No syncope. Record review shows that the patient was seen at [facility] this morning.    5:27 PM CT head without acute abnormality.  Acute abd and chest without acute abnormality.  No PNA, free air or bowel obstruction.  Troponin negative.  CBG 118.   Pt daughter now reports that he was never fully unconscious.    Pt and family offered admission as I am concerned about his episode of bradycardia, however he declines this.  Pt is alert and oriented.  No syncope here in the ED.  He will be with family at home and they are comfortable without an admission.   6:18 PM Patient has done well with oral challenge. No emesis. He reports he feels well and continues to request discharge home.  He ambulates here in the emergency department. Discussed reasons to return to  the emergency room with patient and his wife including return of similar symptoms.  BP 141/73 mmHg  Pulse 85  Temp(Src) 97.7 F (36.5 C) (Oral)  Resp 19  Ht 5\' 7"  (1.702 m)  Wt 135 lb (61.236 kg)  BMI 21.14 kg/m2  SpO2 99%   The patient was discussed with and seen by Dr. Zenia Resides who agrees with the treatment plan.   Abigail Butts, PA-C 12/09/14 1910

## 2014-12-09 NOTE — ED Provider Notes (Signed)
CSN: [id number]     Arrival date & time 12/09/14  0907 History   First MD Initiated Contact with Patient 12/09/14 [phone]     Chief Complaint  Patient presents with  . Urinary Frequency     (Consider location/radiation/quality/duration/timing/severity/associated sxs/prior Treatment) HPI Patient states that he started having frequent urination overnight. He reports he was having to go the bathroom at least every 30 minutes. He denies that there is any pain associated. There has not been any fever. No nausea no vomiting. They deny he's had urinary tract infection in the past. He denies being on any type of a diuretic. He has not seen any blood in the urine. Second complaint is for some right lateral neck pain. His family member reports that he falls asleep in the chair watching television and his neck is always laid over to the side. She believes as the cause of his pain. He denies other associated problems with does endorse that the side of his neck hurts. Past Medical History  Diagnosis Date  . Cancer ([place])     colon  . Hyperlipidemia   . Hypertension   . BPH (benign prostatic hypertrophy)   . DVT (deep venous thrombosis) ([place])     with pulmonary embo.   Past Surgical History  Procedure Laterality Date  . Colon surgery      partial colectomy  . Hernia repair      ingunial   History reviewed. No pertinent family history. Social History  Substance Use Topics  . Smoking status: Former Smoker -- 2.00 packs/day for 30 years    Types: Cigarettes    Quit date: 02/28/1983  . Smokeless tobacco: Never Used  . Alcohol Use: No    Review of Systems  10 Systems reviewed and are negative for acute change except as noted in the HPI.   Allergies  Penicillins  Home Medications   Prior to Admission medications   Medication Sig Start Date End Date Taking? Authorizing Provider  doxazosin (CARDURA) 4 MG tablet TAKE ONE TABLET BY MOUTH AT BEDTIME Patient taking differently: Takes 1 tablet  by mouth twice per day 09/01/14  Yes [name], MD  fosinopril (MONOPRIL) 20 MG tablet TAKE ONE TABLET BY MOUTH ONCE DAILY 11/03/14  Yes [name], MD  ibuprofen (ADVIL,MOTRIN) 200 MG tablet Take 400 mg by mouth every 6 (six) hours as needed for fever, headache, mild pain, moderate pain or cramping.   Yes Historical Provider, MD  Influenza vac split quadrivalent PF (FLUARIX) 0.5 ML injection Inject 0.5 mLs into the muscle once.   Yes Historical Provider, MD  Multiple Vitamin (MULTIVITAMIN WITH MINERALS) TABS tablet Take 1 tablet by mouth daily.   Yes Historical Provider, MD  acetaminophen (TYLENOL) 500 MG tablet Take 2 tablets (1,000 mg total) by mouth every 6 (six) hours as needed. 12/09/14   Charlesetta Shanks, MD  doxazosin (CARDURA) 4 MG tablet TAKE ONE TABLET BY MOUTH AT BEDTIME Patient not taking: Reported on 12/09/2014 08/27/14   [name], MD  orphenadrine (NORFLEX) 100 MG tablet Take 1 tablet (100 mg total) by mouth 2 (two) times daily. 12/09/14   Charlesetta Shanks, MD   BP 167/96 mmHg  Pulse 89  Temp(Src) 97.5 F (36.4 C) (Oral)  Resp 19  SpO2 97% Physical Exam  Constitutional: He is oriented to person, place, and time. He appears well-developed and well-nourished.  HENT:  Head: Normocephalic and atraumatic.  Eyes: EOM are normal. Pupils are equal, round, and reactive to  light.  Neck: Neck supple.  Muscular spasm and tenderness along the trapezius of the right paracervical region. Otherwise supple. Voice is clear.  Cardiovascular: Normal rate, regular rhythm, normal heart sounds and intact distal pulses.   Pulmonary/Chest: Effort normal.  Somewhat diminished breath sounds at the bases.  Abdominal: Soft. Bowel sounds are normal. He exhibits no distension. There is tenderness.  Patient endorses mild discomfort to deep palpation in the suprapubic region. No appreciable mass.  Genitourinary: Penis normal.  Musculoskeletal: Normal range of motion. He exhibits  edema. He exhibits no tenderness.  Trace bilateral lower shotty edema with skin thinning consistent with chronic venous stasis.  Neurological: He is alert and oriented to person, place, and time. He has normal strength. Coordination normal. GCS eye subscore is 4. GCS verbal subscore is 5. GCS motor subscore is 6.  Skin: Skin is warm, dry and intact.  Psychiatric: He has a normal mood and affect.    ED Course  Procedures (including critical care time) Labs Review Labs Reviewed  URINALYSIS, ROUTINE W REFLEX MICROSCOPIC (NOT AT [facility]) - Abnormal; Notable for the following:    APPearance CLOUDY (*)    Hgb urine dipstick TRACE (*)    Protein, ur 100 (*)    All other components within normal limits  BASIC METABOLIC PANEL - Abnormal; Notable for the following:    BUN 26 (*)    Creatinine, Ser 1.35 (*)    GFR calc non Af Amer 42 (*)    GFR calc Af Amer 49 (*)    All other components within normal limits  CBC WITH DIFFERENTIAL/PLATELET - Abnormal; Notable for the following:    RBC 4.08 (*)    Hemoglobin 12.6 (*)    HCT 38.2 (*)    Lymphs Abs 0.4 (*)    All other components within normal limits  URINE MICROSCOPIC-ADD ON    Imaging Review No results found. I have personally reviewed and evaluated these images and lab results as part of my medical decision-making.   EKG Interpretation None      MDM   Final diagnoses:  Urinary retention  Torticollis   Patient's primary complaint is urinary frequency. He reports last night he had to get up 10-12 times to urinate where is at baseline he only might get up twice during the night. His postvoid residual was a minimum of 250 mils. There is no evidence of UTI or renal insufficiency. At this point time a Foley catheter replaced with plan urology follow-up. Patient also had a secondary complaint of neck stiffness consistent with torticollis. This is muscle skeletal nature with palpable muscle spasm and a history consistent.    Charlesetta Shanks, MD 12/09/14 1147

## 2014-12-09 NOTE — Discharge Instructions (Signed)
1. Medications: zofran as needed for nausea and vomiting; usual home medications 2. Treatment: rest, drink plenty of fluids,  3. Follow Up: Please followup with your primary doctor in 2 days for discussion of your diagnoses and further evaluation after today's visit; if you do not have a primary care doctor use the resource guide provided to find one; Please return to the ER for return or worsening of symptoms

## 2014-12-09 NOTE — Discharge Instructions (Signed)
Acute Urinary Retention, Male Acute urinary retention is when you are unable to pee (urinate). Acute urinary retention is common in older men. Prostates can get bigger, which blocks the flow of pee.  HOME CARE  Drink enough fluids to keep your pee clear or pale yellow.  If you are sent home with a tube that drains the bladder (catheter), there will be a drainage bag attached to it. There are two types of bags. One is big that you can wear at night without having to empty it. One is smaller and needs to be emptied more often.  Keep the drainage bag empty.  Keep the drainage bag lower than your catheter.  Only take medicine as told by your doctor. GET HELP IF:  You have a low-grade fever.  You have spasms or you are leaking pee when you have spasms. GET HELP RIGHT AWAY IF:   You have chills or a fever.  Your catheter stops draining pee.  Your catheter falls out.  You have increased bleeding that does not stop after you have rested and increased the amount of fluids you had been drinking. MAKE SURE YOU:   Understand these instructions.  Will watch your condition.  Will get help right away if you are not doing well or get worse.   This information is not intended to replace advice given to you by your health care provider. Make sure you discuss any questions you have with your health care provider.   Document Released: 08/02/2007 Document Revised: 06/30/2014 Document Reviewed: 07/25/2012 Elsevier Interactive Patient Education 2016 Elsevier Inc.  Acute Torticollis Torticollis is a condition in which the muscles of the neck tighten (contract) abnormally, causing the neck to twist and the head to move into an unnatural position. Torticollis that develops suddenly is called acute torticollis. If torticollis becomes chronic and is left untreated, the face and neck can become deformed. CAUSES This condition may be caused by:  Sleeping in an awkward position (common).  Extending  or twisting the neck muscles beyond their normal position.  Infection. In some cases, the cause may not be known. SYMPTOMS Symptoms of this condition include:  An unnatural position of the head.  Neck pain.  A limited ability to move the neck.  Twisting of the neck to one side. DIAGNOSIS This condition is diagnosed with a physical exam. You may also have imaging tests, such as an X-ray, CT scan, or MRI. TREATMENT Treatment for this condition involves trying to relax the neck muscles. It may include:  Medicines or shots.  Physical therapy.  Surgery. This may be done in severe cases. HOME CARE INSTRUCTIONS  Take medicines only as directed by your health care provider.  Do stretching exercises and massage your neck as directed by your health care provider.  Keep all follow-up visits as directed by your health care provider. This is important. SEEK MEDICAL CARE IF:  You develop a fever. SEEK IMMEDIATE MEDICAL CARE IF:  You develop difficulty breathing.  You develop noisy breathing (stridor).  You start drooling.  You have trouble swallowing or have pain with swallowing.  You develop numbness or weakness in your hands or feet.  You have changes in your speech, understanding, or vision.  Your pain gets worse.   This information is not intended to replace advice given to you by your health care provider. Make sure you discuss any questions you have with your health care provider.   Document Released: 02/11/2000 Document Revised: 06/30/2014 Document Reviewed: 02/09/2014 Elsevier  Interactive Patient Education ©2016 Elsevier Inc. ° °

## 2014-12-09 NOTE — ED Notes (Signed)
Pt alert and oriented x4. Respirations even and unlabored, bilateral symmetrical rise and fall of chest. Skin warm and dry. In no acute distress. Denies needs.   

## 2014-12-16 ENCOUNTER — Telehealth: Payer: Self-pay | Admitting: Internal Medicine

## 2014-12-16 NOTE — Telephone Encounter (Signed)
Ok for referrals  

## 2014-12-16 NOTE — Telephone Encounter (Signed)
Please see message. °

## 2014-12-16 NOTE — Telephone Encounter (Signed)
Wife called to advise pt went to ED after a episode of frequent urination and neck pain. Pt started mumbling and they called ambulance.  [place] [facility] put a catherer in and made a neurolgist referral . They could not find anything wrong at the hospital.  They sent him home, but pt is weak and not eating very well.  Wife is asking if we could order some home health for assistance in bathing and helping to bathroom until he can get steady. Pt has appt w/ neuro on 10/27.  Medicare states pt needs referral form his PCP.

## 2014-12-17 NOTE — Telephone Encounter (Signed)
Left message on voicemail to call office.  

## 2014-12-18 NOTE — Telephone Encounter (Signed)
Cheryl scheduled pt for an appt on 10/28 ED follow up and it discuss Home Health referral.

## 2014-12-24 DIAGNOSIS — R3912 Poor urinary stream: Secondary | ICD-10-CM | POA: Diagnosis not present

## 2014-12-24 DIAGNOSIS — R351 Nocturia: Secondary | ICD-10-CM | POA: Diagnosis not present

## 2014-12-24 DIAGNOSIS — N138 Other obstructive and reflux uropathy: Secondary | ICD-10-CM | POA: Diagnosis not present

## 2014-12-24 DIAGNOSIS — N401 Enlarged prostate with lower urinary tract symptoms: Secondary | ICD-10-CM | POA: Diagnosis not present

## 2014-12-25 ENCOUNTER — Ambulatory Visit (INDEPENDENT_AMBULATORY_CARE_PROVIDER_SITE_OTHER): Payer: Medicare Other | Admitting: Internal Medicine

## 2014-12-25 ENCOUNTER — Encounter: Payer: Self-pay | Admitting: Internal Medicine

## 2014-12-25 VITALS — BP 128/80 | HR 88 | Temp 97.5°F | Resp 16 | Ht 67.0 in | Wt 135.0 lb

## 2014-12-25 DIAGNOSIS — D631 Anemia in chronic kidney disease: Secondary | ICD-10-CM | POA: Diagnosis not present

## 2014-12-25 DIAGNOSIS — I10 Essential (primary) hypertension: Secondary | ICD-10-CM

## 2014-12-25 DIAGNOSIS — N189 Chronic kidney disease, unspecified: Principal | ICD-10-CM

## 2014-12-25 DIAGNOSIS — R339 Retention of urine, unspecified: Secondary | ICD-10-CM

## 2014-12-25 NOTE — Patient Instructions (Signed)
Return in 6 months for follow-up  Please check your blood pressure on a regular basis.  If it is consistently greater than 150/90, please make an office appointment.  Limit your sodium (Salt) intake

## 2014-12-25 NOTE — Progress Notes (Signed)
Subjective:    Patient ID: Howard Padilla, male    DOB: [birth year], 81 y.o.   MRN: [id number]  HPI  Patient seen today for a face to face evaluation to determine need for home health services. Patient is 81 years old and has had recently to ED visits.  Initially he presented with severe neck pain and was noted have a urinary retention.  While driving home from the ED encounter.  He had a near-syncopal episode and was reevaluated. The patient has some chronic weakness and requires assistance in aspects of daily living.  His wife who also is elderly requires some assistance at home.  She is unable to lift and safely bathe the patient   Medical problems include essential hypertension and anemia of chronic disease.  He hasn't mild chronic kidney disease which has been stable.  Past Medical History  Diagnosis Date  . Cancer ([place])     colon  . Hyperlipidemia   . Hypertension   . BPH (benign prostatic hypertrophy)   . DVT (deep venous thrombosis) ([place])     with pulmonary embo.    Social History   Social History  . Marital Status: Married    Spouse Name: N/A  . Number of Children: N/A  . Years of Education: N/A   Occupational History  . Not on file.   Social History Main Topics  . Smoking status: Former Smoker -- 2.00 packs/day for 30 years    Types: Cigarettes    Quit date: 02/28/1983  . Smokeless tobacco: Never Used  . Alcohol Use: No  . Drug Use: No  . Sexual Activity: Not on file   Other Topics Concern  . Not on file   Social History Narrative    Past Surgical History  Procedure Laterality Date  . Colon surgery      partial colectomy  . Hernia repair      ingunial    No family history on file.  Allergies  Allergen Reactions  . Penicillins Rash    Has patient had a PCN reaction causing immediate rash, facial/tongue/throat swelling, SOB or lightheadedness with hypotension: No Has patient had a PCN reaction causing severe rash involving mucus membranes or  skin necrosis: No Has patient had a PCN reaction that required hospitalization No Has patient had a PCN reaction occurring within the last 10 years: No If all of the above answers are "NO", then may proceed with Cephalosporin use.     Current Outpatient Prescriptions on File Prior to Visit  Medication Sig Dispense Refill  . acetaminophen (TYLENOL) 500 MG tablet Take 2 tablets (1,000 mg total) by mouth every 6 (six) hours as needed. 30 tablet 0  . doxazosin (CARDURA) 4 MG tablet TAKE ONE TABLET BY MOUTH AT BEDTIME 90 tablet 0  . fosinopril (MONOPRIL) 20 MG tablet TAKE ONE TABLET BY MOUTH ONCE DAILY 90 tablet 0  . Multiple Vitamin (MULTIVITAMIN WITH MINERALS) TABS tablet Take 1 tablet by mouth daily.    . orphenadrine (NORFLEX) 100 MG tablet Take 1 tablet (100 mg total) by mouth 2 (two) times daily. 30 tablet 0   No current facility-administered medications on file prior to visit.    BP 128/80 mmHg  Pulse 88  Temp(Src) 97.5 F (36.4 C) (Oral)  Resp 16  Ht 5\' 7"  (1.702 m)  Wt 135 lb (61.236 kg)  BMI 21.14 kg/m2  SpO2 96%    Review of Systems  Constitutional: Negative for fever, chills, appetite change and fatigue.  HENT:  Negative for congestion, dental problem, ear pain, hearing loss, sore throat, tinnitus, trouble swallowing and voice change.   Eyes: Negative for pain, discharge and visual disturbance.  Respiratory: Negative for cough, chest tightness, wheezing and stridor.   Cardiovascular: Negative for chest pain, palpitations and leg swelling.  Gastrointestinal: Negative for nausea, vomiting, abdominal pain, diarrhea, constipation, blood in stool and abdominal distention.  Genitourinary: Negative for urgency, hematuria, flank pain, discharge, difficulty urinating and genital sores.  Musculoskeletal: Positive for gait problem and neck pain. Negative for myalgias, back pain, joint swelling, arthralgias and neck stiffness.  Skin: Negative for rash.  Neurological: Positive for  dizziness and weakness. Negative for syncope, speech difficulty, numbness and headaches.  Hematological: Negative for adenopathy. Does not bruise/bleed easily.  Psychiatric/Behavioral: Negative for behavioral problems and dysphoric mood. The patient is not nervous/anxious.        Objective:   Physical Exam  Constitutional: He is oriented to person, place, and time. He appears well-developed.  HENT:  Head: Normocephalic.  Right Ear: External ear normal.  Left Ear: External ear normal.  Eyes: Conjunctivae and EOM are normal.  Neck: Normal range of motion.  Cardiovascular: Normal rate and normal heart sounds.   Pulmonary/Chest: Breath sounds normal.  Abdominal: Bowel sounds are normal.  Musculoskeletal: Normal range of motion. He exhibits no edema or tenderness.  Neurological: He is alert and oriented to person, place, and time.  Psychiatric: He has a normal mood and affect. His behavior is normal.          Assessment & Plan:   History of acute urinary retention, stable.  Follow-up urology Hypertension, well-controlled General debility.  Will arrange for home health care and assistance History weight loss  Recheck 6 months Follow-up urology Home health care consultation requested

## 2014-12-25 NOTE — Progress Notes (Signed)
Pre visit review using our clinic review tool, if applicable. No additional management support is needed unless otherwise documented below in the visit note. 

## 2015-01-06 ENCOUNTER — Observation Stay (HOSPITAL_COMMUNITY): Payer: Medicare Other

## 2015-01-06 ENCOUNTER — Emergency Department (HOSPITAL_COMMUNITY): Payer: Medicare Other

## 2015-01-06 ENCOUNTER — Encounter (HOSPITAL_COMMUNITY): Payer: Self-pay | Admitting: *Deleted

## 2015-01-06 ENCOUNTER — Inpatient Hospital Stay (HOSPITAL_COMMUNITY)
Admission: EM | Admit: 2015-01-06 | Discharge: 2015-01-08 | DRG: 066 | Disposition: A | Discharge status: Home Health | Payer: Medicare Other | Attending: Internal Medicine | Admitting: Internal Medicine

## 2015-01-06 DIAGNOSIS — R471 Dysarthria and anarthria: Secondary | ICD-10-CM | POA: Diagnosis present

## 2015-01-06 DIAGNOSIS — I10 Essential (primary) hypertension: Secondary | ICD-10-CM | POA: Diagnosis present

## 2015-01-06 DIAGNOSIS — R4781 Slurred speech: Secondary | ICD-10-CM | POA: Diagnosis not present

## 2015-01-06 DIAGNOSIS — Z87891 Personal history of nicotine dependence: Secondary | ICD-10-CM

## 2015-01-06 DIAGNOSIS — G459 Transient cerebral ischemic attack, unspecified: Secondary | ICD-10-CM | POA: Insufficient documentation

## 2015-01-06 DIAGNOSIS — I639 Cerebral infarction, unspecified: Secondary | ICD-10-CM | POA: Diagnosis present

## 2015-01-06 DIAGNOSIS — E860 Dehydration: Secondary | ICD-10-CM | POA: Diagnosis not present

## 2015-01-06 DIAGNOSIS — I63519 Cerebral infarction due to unspecified occlusion or stenosis of unspecified middle cerebral artery: Principal | ICD-10-CM | POA: Diagnosis present

## 2015-01-06 DIAGNOSIS — Z86718 Personal history of other venous thrombosis and embolism: Secondary | ICD-10-CM

## 2015-01-06 DIAGNOSIS — R531 Weakness: Secondary | ICD-10-CM | POA: Diagnosis not present

## 2015-01-06 DIAGNOSIS — I63039 Cerebral infarction due to thrombosis of unspecified carotid artery: Secondary | ICD-10-CM

## 2015-01-06 DIAGNOSIS — I6522 Occlusion and stenosis of left carotid artery: Secondary | ICD-10-CM | POA: Diagnosis not present

## 2015-01-06 DIAGNOSIS — Z86711 Personal history of pulmonary embolism: Secondary | ICD-10-CM

## 2015-01-06 DIAGNOSIS — I63412 Cerebral infarction due to embolism of left middle cerebral artery: Secondary | ICD-10-CM | POA: Diagnosis not present

## 2015-01-06 DIAGNOSIS — N400 Enlarged prostate without lower urinary tract symptoms: Secondary | ICD-10-CM | POA: Diagnosis present

## 2015-01-06 DIAGNOSIS — R4701 Aphasia: Secondary | ICD-10-CM | POA: Diagnosis not present

## 2015-01-06 DIAGNOSIS — E785 Hyperlipidemia, unspecified: Secondary | ICD-10-CM | POA: Diagnosis present

## 2015-01-06 DIAGNOSIS — R404 Transient alteration of awareness: Secondary | ICD-10-CM | POA: Diagnosis not present

## 2015-01-06 DIAGNOSIS — Z85038 Personal history of other malignant neoplasm of large intestine: Secondary | ICD-10-CM

## 2015-01-06 DIAGNOSIS — Z79899 Other long term (current) drug therapy: Secondary | ICD-10-CM

## 2015-01-06 LAB — DIFFERENTIAL
BASOS ABS: 0 10*3/uL (ref 0.0–0.1)
Basophils Relative: 0 %
Eosinophils Absolute: 0.1 10*3/uL (ref 0.0–0.7)
Eosinophils Relative: 2 %
LYMPHS ABS: 1.1 10*3/uL (ref 0.7–4.0)
LYMPHS PCT: 28 %
MONOS PCT: 10 %
Monocytes Absolute: 0.4 10*3/uL (ref 0.1–1.0)
NEUTROS ABS: 2.4 10*3/uL (ref 1.7–7.7)
Neutrophils Relative %: 60 %

## 2015-01-06 LAB — CBC
HEMATOCRIT: 37.4 % — AB (ref 39.0–52.0)
HEMOGLOBIN: 12.4 g/dL — AB (ref 13.0–17.0)
MCH: 30.8 pg (ref 26.0–34.0)
MCHC: 33.2 g/dL (ref 30.0–36.0)
MCV: 93 fL (ref 78.0–100.0)
Platelets: 171 10*3/uL (ref 150–400)
RBC: 4.02 MIL/uL — AB (ref 4.22–5.81)
RDW: 15.1 % (ref 11.5–15.5)
WBC: 3.9 10*3/uL — AB (ref 4.0–10.5)

## 2015-01-06 LAB — I-STAT CHEM 8, ED
BUN: 25 mg/dL — AB (ref 6–20)
CREATININE: 1.4 mg/dL — AB (ref 0.61–1.24)
Calcium, Ion: 1.15 mmol/L (ref 1.13–1.30)
Chloride: 106 mmol/L (ref 101–111)
GLUCOSE: 92 mg/dL (ref 65–99)
HCT: 40 % (ref 39.0–52.0)
HEMOGLOBIN: 13.6 g/dL (ref 13.0–17.0)
POTASSIUM: 4.6 mmol/L (ref 3.5–5.1)
Sodium: 140 mmol/L (ref 135–145)
TCO2: 25 mmol/L (ref 0–100)

## 2015-01-06 LAB — APTT: APTT: 33 s (ref 24–37)

## 2015-01-06 LAB — URINALYSIS, ROUTINE W REFLEX MICROSCOPIC
Bilirubin Urine: NEGATIVE
GLUCOSE, UA: NEGATIVE mg/dL
Hgb urine dipstick: NEGATIVE
KETONES UR: NEGATIVE mg/dL
LEUKOCYTES UA: NEGATIVE
Nitrite: NEGATIVE
PH: 7.5 (ref 5.0–8.0)
Protein, ur: NEGATIVE mg/dL
SPECIFIC GRAVITY, URINE: 1.007 (ref 1.005–1.030)
Urobilinogen, UA: 0.2 mg/dL (ref 0.0–1.0)

## 2015-01-06 LAB — LIPID PANEL
Cholesterol: 213 mg/dL — ABNORMAL HIGH (ref 0–200)
HDL: 61 mg/dL (ref >40)
LDL CALC: 139 mg/dL — AB (ref 0–99)
TRIGLYCERIDES: 67 mg/dL (ref <150)
Total CHOL/HDL Ratio: 3.5 RATIO
VLDL: 13 mg/dL (ref 0–40)

## 2015-01-06 LAB — COMPREHENSIVE METABOLIC PANEL
ALT: 14 U/L — AB (ref 17–63)
AST: 20 U/L (ref 15–41)
Albumin: 3.6 g/dL (ref 3.5–5.0)
Alkaline Phosphatase: 101 U/L (ref 38–126)
Anion gap: 10 (ref 5–15)
BILIRUBIN TOTAL: 0.7 mg/dL (ref 0.3–1.2)
BUN: 19 mg/dL (ref 6–20)
CO2: 23 mmol/L (ref 22–32)
CREATININE: 1.32 mg/dL — AB (ref 0.61–1.24)
Calcium: 9.4 mg/dL (ref 8.9–10.3)
Chloride: 107 mmol/L (ref 101–111)
GFR calc Af Amer: 50 mL/min — ABNORMAL LOW (ref >60)
GFR, EST NON AFRICAN AMERICAN: 43 mL/min — AB (ref >60)
GLUCOSE: 100 mg/dL — AB (ref 65–99)
Potassium: 4.6 mmol/L (ref 3.5–5.1)
Sodium: 140 mmol/L (ref 135–145)
TOTAL PROTEIN: 7.2 g/dL (ref 6.5–8.1)

## 2015-01-06 LAB — I-STAT TROPONIN, ED: TROPONIN I, POC: 0.02 ng/mL (ref 0.00–0.08)

## 2015-01-06 LAB — PROTIME-INR
INR: 1.11 (ref 0.00–1.49)
Prothrombin Time: 14.5 seconds (ref 11.6–15.2)

## 2015-01-06 MED ORDER — SODIUM CHLORIDE 0.9 % IJ SOLN
3.0000 mL | Freq: Two times a day (BID) | INTRAMUSCULAR | Status: DC
  Administered 2015-01-06 – 2015-01-07 (×3): 3 mL via INTRAVENOUS

## 2015-01-06 MED ORDER — SODIUM CHLORIDE 0.9 % IV SOLN
INTRAVENOUS | Status: DC
  Administered 2015-01-06: 21:00:00 via INTRAVENOUS

## 2015-01-06 MED ORDER — ACETAMINOPHEN 325 MG PO TABS: 650.0000 mg | ORAL_TABLET | Freq: Four times a day (QID) | ORAL | Status: DC | PRN

## 2015-01-06 MED ORDER — ACETAMINOPHEN 650 MG RE SUPP: 650.0000 mg | Freq: Four times a day (QID) | RECTAL | Status: DC | PRN

## 2015-01-06 MED ORDER — ASPIRIN 325 MG PO TABS
325.0000 mg | ORAL_TABLET | Freq: Every day | ORAL | Status: DC
  Administered 2015-01-07 – 2015-01-08 (×2): 325 mg via ORAL
  Filled 2015-01-06 (×2): qty 1

## 2015-01-06 MED ORDER — LORAZEPAM 2 MG/ML IJ SOLN
0.5000 mg | Freq: Once | INTRAMUSCULAR | Status: AC
  Administered 2015-01-06: 0.5 mg via INTRAVENOUS
  Filled 2015-01-06: qty 1

## 2015-01-06 MED ORDER — DOXAZOSIN MESYLATE 4 MG PO TABS
4.0000 mg | ORAL_TABLET | Freq: Every day | ORAL | Status: DC
  Administered 2015-01-06 – 2015-01-07 (×2): 4 mg via ORAL
  Filled 2015-01-06 (×3): qty 1

## 2015-01-06 MED ORDER — ONDANSETRON HCL 4 MG PO TABS
4.0000 mg | ORAL_TABLET | Freq: Four times a day (QID) | ORAL | Status: DC | PRN
Start: 2015-01-06 — End: 2015-01-08

## 2015-01-06 MED ORDER — HEPARIN SODIUM (PORCINE) 5000 UNIT/ML IJ SOLN
5000.0000 [IU] | Freq: Three times a day (TID) | INTRAMUSCULAR | Status: DC
  Administered 2015-01-06 – 2015-01-08 (×5): 5000 [IU] via SUBCUTANEOUS
  Filled 2015-01-06 (×5): qty 1

## 2015-01-06 MED ORDER — ASPIRIN 300 MG RE SUPP: 300.0000 mg | Freq: Every day | RECTAL | Status: DC

## 2015-01-06 MED ORDER — ONDANSETRON HCL 4 MG/2ML IJ SOLN: 4.0000 mg | Freq: Four times a day (QID) | INTRAMUSCULAR | Status: DC | PRN

## 2015-01-06 MED ORDER — STROKE: EARLY STAGES OF RECOVERY BOOK
Freq: Once | Status: DC
  Filled 2015-01-06: qty 1

## 2015-01-06 NOTE — Consult Note (Signed)
[facility] Medical Consultation  Emet Rafanan RWE:[id number] DOB: 08-14-1915 DOA: 01/06/2015 PCP: Nyoka Cowden, MD   Requesting physician: Dr. Thomasene Lot - [facility] Date of consultation: 01/06/15  Reason for consultation: TIA/Stroke  Impression/Recommendations Active Problems:   HLD (hyperlipidemia)   Essential hypertension   BPH (benign prostatic hyperplasia)   Aphasia    Slurred speech: Patient presenting with altered a history of waxing and waning slurring of the speech and intermittent difficulty following commands. Of note patient evaluated previously in the ED for this with a negative workup. Today patient's CT of his head was negative. Very lengthy discussion had with patient's wife, daughter and granddaughter to discuss TIAs, strokes, etiology of symptoms, prognosis, and efforts to mitigate further symptoms and need for home health for formal physical therapy. Of note patient is alert he had an order placed by primary care physician for home health for the family has not heard anything on this and may need to expedite this process. Family aware that patient's symptoms may continue to wax and wane or become permanent that abortive therapy at this point in time is not an option for the patient and all I can be done is further evaluation to find out where the stroke is coming from and how to potentially mitigate further worsening of symptoms. Recommend the following - MRI MRA head and neck (Ordered in ED) - Outpatient echo ( EKG sinus) - Fasting lipid and A1c ( order to ED) - Social work consult to establish home health care and outpatient physical therapy  Again family aware that patient's symptoms may completely resolve or he may continue to deteriorate about abortive stroke therapy is not an option for this patient. At time of exam patient was completely lucid and tearful and requesting to go home after his MRI and blood work. Patient is currently  asymptomatic.  Hypertension: Patient noted to be hypertensive at this time which is likely helping with perfusion beyond small atherosclerotic plaque causing patient symptoms listed above. All permissive hypertension for 24 hours.  - Recommending patient hold his home ACE inhibitor for 24 hours.  Hyperlipidemia: Patient missed his having hyperlipidemia but without statin listed on medical regimen. Will obtain lipid panel at this time recommend full discussion with primary care physician and starting of cluster lowering medications. Family aware that these medications, thrown risks and they be of little benefit at this point in time. - Follow-up PCP  Chief Complaint: slurred speech  HPI:  Level 5 caveat. With intermittent Slurred speech and aphasia and difficulty following commands.  History provided by patient, [facility] physician, wife, daughter and granddaughter. Patient lives at home with family. Patient typically able to perform ADLs up until onset of symptoms. Patient is typically alert and oriented 3. For the last several days patient has had intermittent episodes of slurred speech. He was evaluated in the ED for this with a negative workup and patient requesting to go home at that time. Most recent episode began this morning around 8 AM. Nothing makes the symptoms better. Nothing to his symptoms worse. Patient was last seen normal prior to bed last night. Denies chest pain, short of breath, palpitations, fevers, nausea, vomiting, loss of bowel or bladder function, fall, head trauma, cough, shortness of breath, dysuria.   Review of Systems:  Unable to obtain further review of systems due to patient's mental status and lack of family access for history giving. Per history of present illness with all other systems negative.   Past Medical History  Diagnosis Date  . Cancer ([place])     colon  . Hyperlipidemia   . Hypertension   . BPH (benign prostatic hypertrophy)   . DVT (deep venous  thrombosis) ([place])     with pulmonary embo.   Past Surgical History  Procedure Laterality Date  . Colon surgery      partial colectomy  . Hernia repair      ingunial   Social History:  reports that he quit smoking about 31 years ago. His smoking use included Cigarettes. He has a 60 pack-year smoking history. He has never used smokeless tobacco. He reports that he does not drink alcohol or use illicit drugs.  Allergies  Allergen Reactions  . Penicillins Rash    Has patient had a PCN reaction causing immediate rash, facial/tongue/throat swelling, SOB or lightheadedness with hypotension: No Has patient had a PCN reaction causing severe rash involving mucus membranes or skin necrosis: No Has patient had a PCN reaction that required hospitalization No Has patient had a PCN reaction occurring within the last 10 years: No If all of the above answers are "NO", then may proceed with Cephalosporin use.    Family History  Problem Relation Age of Onset  . Family history unknown: Yes    Prior to Admission medications   Medication Sig Start Date End Date Taking? Authorizing Provider  doxazosin (CARDURA) 4 MG tablet TAKE ONE TABLET BY MOUTH AT BEDTIME 08/27/14  Yes [name], MD  fosinopril (MONOPRIL) 20 MG tablet TAKE ONE TABLET BY MOUTH ONCE DAILY 11/03/14  Yes [name], MD  Multiple Vitamin (MULTIVITAMIN WITH MINERALS) TABS tablet Take 1 tablet by mouth daily.   Yes Historical Provider, MD  acetaminophen (TYLENOL) 500 MG tablet Take 2 tablets (1,000 mg total) by mouth every 6 (six) hours as needed. Patient not taking: Reported on 01/06/2015 12/09/14   Charlesetta Shanks, MD  orphenadrine (NORFLEX) 100 MG tablet Take 1 tablet (100 mg total) by mouth 2 (two) times daily. Patient not taking: Reported on 01/06/2015 12/09/14   Charlesetta Shanks, MD   Physical Exam: Blood pressure 185/107, pulse 119, temperature 97.7 F (36.5 C), temperature source Oral, resp. rate 18, height 6' (1.829  m), weight 61.236 kg (135 lb), SpO2 98 %. Filed Vitals:   01/06/15 1328  BP: 185/107  Pulse: 119  Temp:   Resp: 18    General:  Appears calm and comfortable Eyes: EOMI, normal lids,  ENT: Dry MM Neck:  no LAD, masses or thyromegaly Cardiovascular:  RRR, 2+ pedal pulses. No LE edema.  Respiratory: Nml effort, on RA, no respiratory distress Abdomen:  soft, ntnd Skin:  no rash or induration seen on limited exam Musculoskeletal: Limited exam due to pts presentation. Moves all extremities on command. No appreciable effusions.  Psychiatric: Initially patient alert and oriented 0 after reevaluation alert and oriented 3 and follows commands intermittently.Marland Kitchen  Neurologic: Limited exam due to pt not following commands.  CN 2-12 grossly intact, moves all extremities in coordinated fashion.     Labs on Admission:  Basic Metabolic Panel:  Recent Labs Lab 01/06/15 0840 01/06/15 0844  NA 140 140  K 4.6 4.6  CL 107 106  CO2 23  --   GLUCOSE 100* 92  BUN 19 25*  CREATININE 1.32* 1.40*  CALCIUM 9.4  --    Liver Function Tests:  Recent Labs Lab 01/06/15 0840  AST 20  ALT 14*  ALKPHOS 101  BILITOT 0.7  PROT 7.2  ALBUMIN 3.6  No results for input(s): LIPASE, AMYLASE in the last 168 hours. No results for input(s): AMMONIA in the last 168 hours. CBC:  Recent Labs Lab 01/06/15 0840 01/06/15 0844  WBC 3.9*  --   NEUTROABS 2.4  --   HGB 12.4* 13.6  HCT 37.4* 40.0  MCV 93.0  --   PLT 171  --    Cardiac Enzymes: No results for input(s): CKTOTAL, CKMB, CKMBINDEX, TROPONINI in the last 168 hours. BNP: Invalid input(s): POCBNP CBG: No results for input(s): GLUCAP in the last 168 hours.  Radiological Exams on Admission: Ct Head Wo Contrast  01/06/2015  CLINICAL DATA:  Slurred speech and generalize weakness. EXAM: CT HEAD WITHOUT CONTRAST TECHNIQUE: Contiguous axial images were obtained from the base of the skull through the vertex without intravenous contrast. COMPARISON:   12/09/2014 FINDINGS: Sinuses/Soft tissues: Clear paranasal sinuses and mastoid air cells. Intracranial: Normal cerebral volume for age. Moderate-to-marked low density in the periventricular white matter likely related to small vessel disease. 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 Chronic. No mass lesion, hemorrhage, hydrocephalus, acute infarct, intra-axial, or extra-axial fluid collection. IMPRESSION: 1.  No acute intracranial abnormality. 2.  Cerebral atrophy and small vessel ischemic change. Electronically Signed   By: Abigail Miyamoto M.D.   On: 01/06/2015 10:58    EKG: Independently reviewed. Sinus. posible LAFB, no ACS  Time spent: >70 min in direct pt care and coordination  [place]   If 7PM-7AM, please contact night-coverage [email or web address] Password [email or web address] 01/06/2015, 1:50 PM

## 2015-01-06 NOTE — ED Notes (Signed)
Patient transported to MRI 

## 2015-01-06 NOTE — Care Management Note (Signed)
Case Management Note  Patient Details  Name: Howard Padilla MRN: [id number] Date of Birth: 1915/12/02  Subjective/Objective:                  81 year old male. He presents today with slurred speech. //Home with spouse.  Action/Plan: Follow for disposition needs.   Expected Discharge Date:       01/06/2015           Expected Discharge Plan:  [place]  In-House Referral:  NA  Discharge planning Services  CM Consult  Post Acute Care Choice:  Home Health Choice offered to:  Patient  DME Arranged:  N/A DME Agency:  [facility] Arranged:  RN, Disease Management, PT, Nurse's Aide [place] Agency:  [place]  Status of Service:  Completed, signed off  Medicare Important Message Given:    Date Medicare IM Given:    Medicare IM give by:    Date Additional Medicare IM Given:    Additional Medicare Important Message give by:     If discussed at [place] of Stay Meetings, dates discussed:    Additional Comments: Tiearra Colwell J. Clydene Laming, RN, BSN, [employer] [phone] Spoke with pt at bedside regarding discharge planning for [facility]. Offered pt list of home health agencies to choose from.  Pt chose [facility] to render services. Edwinna Areola, RN of [facility] notified.  No DME needs identified at this time.  Fuller Mandril, RN 01/06/2015, 1:56 PM

## 2015-01-06 NOTE — ED Notes (Signed)
Attempted report 

## 2015-01-06 NOTE — Consult Note (Signed)
Referring Physician: Marily Memos    Chief Complaint: Slurredf speech  HPI: Howard Padilla is an 81 y.o. male who was in his usual state of health until sometime yesterday when he was noted by some to have some intermittent difficulty with speech.  He did his usual activities and went to bed ut this morning was noted to have urinated in the bed and speech was much worse.  His difficulty with speech has been intermittent since that time but mental status is not at baseline. At baseline ambulates intermittently.  Dresses himself and feeds himself.  Spends most of his day watching television.      Date last known well: Date: 01/05/2015 Time last known well: Unable to determine tPA Given: No: Outside time window  Past Medical History  Diagnosis Date  . Cancer ([place])     colon  . Hyperlipidemia   . Hypertension   . BPH (benign prostatic hypertrophy)   . DVT (deep venous thrombosis) ([place])     with pulmonary embo.    Past Surgical History  Procedure Laterality Date  . Colon surgery      partial colectomy  . Hernia repair      ingunial    Family History  Problem Relation Age of Onset  . Family history unknown: Yes    Multiple family members with hypertension.   Social History:  reports that he quit smoking about 31 years ago. His smoking use included Cigarettes. He has a 60 pack-year smoking history. He has never used smokeless tobacco. He reports that he does not drink alcohol or use illicit drugs.  Allergies:  Allergies  Allergen Reactions  . Penicillins Rash    Has patient had a PCN reaction causing immediate rash, facial/tongue/throat swelling, SOB or lightheadedness with hypotension: No Has patient had a PCN reaction causing severe rash involving mucus membranes or skin necrosis: No Has patient had a PCN reaction that required hospitalization No Has patient had a PCN reaction occurring within the last 10 years: No If all of the above answers are "NO", then may proceed with  Cephalosporin use.     Medications: I have reviewed the patient's current medications. Prior to Admission:  Prior to Admission medications   Medication Sig Start Date End Date Taking? Authorizing Provider  doxazosin (CARDURA) 4 MG tablet TAKE ONE TABLET BY MOUTH AT BEDTIME 08/27/14  Yes [name], MD  fosinopril (MONOPRIL) 20 MG tablet TAKE ONE TABLET BY MOUTH ONCE DAILY 11/03/14  Yes [name], MD  Multiple Vitamin (MULTIVITAMIN WITH MINERALS) TABS tablet Take 1 tablet by mouth daily.   Yes Historical Provider, MD  acetaminophen (TYLENOL) 500 MG tablet Take 2 tablets (1,000 mg total) by mouth every 6 (six) hours as needed. Patient not taking: Reported on 01/06/2015 12/09/14   Charlesetta Shanks, MD  orphenadrine (NORFLEX) 100 MG tablet Take 1 tablet (100 mg total) by mouth 2 (two) times daily. Patient not taking: Reported on 01/06/2015 12/09/14   Charlesetta Shanks, MD    ROS: History obtained from wife  General ROS: negative for - chills, fatigue, fever, night sweats, weight gain or weight loss Psychological ROS: negative for - behavioral disorder, hallucinations, memory difficulties, mood swings or suicidal ideation Ophthalmic ROS: negative for - blurry vision, double vision, eye pain or loss of vision ENT ROS: negative for - epistaxis, nasal discharge, oral lesions, sore throat, tinnitus or vertigo Allergy and Immunology ROS: negative for - hives or itchy/watery eyes Hematological and Lymphatic ROS: negative for - bleeding  problems, bruising or swollen lymph nodes Endocrine ROS: negative for - galactorrhea, hair pattern changes, polydipsia/polyuria or temperature intolerance Respiratory ROS: negative for - cough, hemoptysis, shortness of breath or wheezing Cardiovascular ROS: negative for - chest pain, dyspnea on exertion, edema or irregular heartbeat Gastrointestinal ROS: negative for - abdominal pain, diarrhea, hematemesis, nausea/vomiting or stool  incontinence Genito-Urinary ROS: incontinence Musculoskeletal ROS: negative for - joint swelling or muscular weakness Neurological ROS: as noted in HPI Dermatological ROS: negative for rash and skin lesion changes  Physical Examination: Blood pressure 147/88, pulse 65, temperature 97.7 F (36.5 C), temperature source Oral, resp. rate 22, height 6' (1.829 m), weight 61.236 kg (135 lb), SpO2 100 %.  HEENT-  Normocephalic, no lesions, without obvious abnormality.  Normal external eye and conjunctiva.  Normal TM's bilaterally.  Normal auditory canals and external ears. Normal external nose, mucus membranes and septum.  Normal pharynx. Cardiovascular- S1, S2 normal, pulses palpable throughout   Lungs- chest clear, no wheezing, rales, normal symmetric air entry Abdomen- soft, non-tender; bowel sounds normal; no masses,  no organomegaly Extremities- mild BLE edema Lymph-no adenopathy palpable Musculoskeletal-no joint tenderness, deformity or swelling Skin-warm and dry, no hyperpigmentation, vitiligo, or suspicious lesions  Neurological Examination Mental Status: Alert, oriented, thought content appropriate.  Speech fluent at times but at other times can not be understood.  Does not follow three step commands even with reinforcement.  Follows some simple commands.   Cranial Nerves: II: Discs flat bilaterally; Visual fields grossly normal.  Pupils irregular III,IV, VI: ptosis not present, extra-ocular motions intact bilaterally V,VII: smile symmetric, facial light touch sensation normal bilaterally VIII: hearing normal bilaterally IX,X: gag reflex present XI: bilateral shoulder shrug XII: midline tongue extension Motor: Right : Upper extremity   5/5    Left:     Upper extremity   5/5  Lower extremity   5/5     Lower extremity   5/5 Tone and bulk:normal tone throughout; no atrophy noted Sensory: Pinprick and light touch intact throughout, bilaterally Deep Tendon Reflexes: 2+ and symmetric  with absent AJ's bilaterally Plantars: Right: upgoing   Left: upgoing Cerebellar: Unable to perform due to ability to follow commands Gait: not tested due to safety concerns    Laboratory Studies:  Basic Metabolic Panel:  Recent Labs Lab 01/06/15 0840 01/06/15 0844  NA 140 140  K 4.6 4.6  CL 107 106  CO2 23  --   GLUCOSE 100* 92  BUN 19 25*  CREATININE 1.32* 1.40*  CALCIUM 9.4  --     Liver Function Tests:  Recent Labs Lab 01/06/15 0840  AST 20  ALT 14*  ALKPHOS 101  BILITOT 0.7  PROT 7.2  ALBUMIN 3.6   No results for input(s): LIPASE, AMYLASE in the last 168 hours. No results for input(s): AMMONIA in the last 168 hours.  CBC:  Recent Labs Lab 01/06/15 0840 01/06/15 0844  WBC 3.9*  --   NEUTROABS 2.4  --   HGB 12.4* 13.6  HCT 37.4* 40.0  MCV 93.0  --   PLT 171  --     Cardiac Enzymes: No results for input(s): CKTOTAL, CKMB, CKMBINDEX, TROPONINI in the last 168 hours.  BNP: Invalid input(s): POCBNP  CBG: No results for input(s): GLUCAP in the last 168 hours.  Microbiology: No results found for this or any previous visit.  Coagulation Studies:  Recent Labs  01/06/15 0840  LABPROT 14.5  INR 1.11    Urinalysis:  Recent Labs Lab 01/06/15 1478  COLORURINE YELLOW  LABSPEC 1.007  PHURINE 7.5  GLUCOSEU NEGATIVE  HGBUR NEGATIVE  BILIRUBINUR NEGATIVE  KETONESUR NEGATIVE  PROTEINUR NEGATIVE  UROBILINOGEN 0.2  NITRITE NEGATIVE  LEUKOCYTESUR NEGATIVE    Lipid Panel:    Component Value Date/Time   CHOL 213* 01/06/2015 1334   TRIG 67 01/06/2015 1334   HDL 61 01/06/2015 1334   CHOLHDL 3.5 01/06/2015 1334   VLDL 13 01/06/2015 1334   LDLCALC 139* 01/06/2015 1334    HgbA1C: No results found for: HGBA1C  Urine Drug Screen:  No results found for: LABOPIA, COCAINSCRNUR, LABBENZ, AMPHETMU, THCU, LABBARB  Alcohol Level: No results for input(s): ETH in the last 168 hours.  Other results: EKG: sinus rhythm at 82 bpm,   LAFB.  Imaging: Ct Head Wo Contrast  01/06/2015  CLINICAL DATA:  Slurred speech and generalize weakness. EXAM: CT HEAD WITHOUT CONTRAST TECHNIQUE: Contiguous axial images were obtained from the base of the skull through the vertex without intravenous contrast. COMPARISON:  12/09/2014 FINDINGS: Sinuses/Soft tissues: Clear paranasal sinuses and mastoid air cells. Intracranial: Normal cerebral volume for age. Moderate-to-marked low density in the periventricular white matter likely related to small vessel disease. 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 Chronic. No mass lesion, hemorrhage, hydrocephalus, acute infarct, intra-axial, or extra-axial fluid collection. IMPRESSION: 1.  No acute intracranial abnormality. 2.  Cerebral atrophy and small vessel ischemic change. Electronically Signed   By: Abigail Miyamoto M.D.   On: 01/06/2015 10:58   Mr Angiogram Head Wo Contrast  01/06/2015  CLINICAL DATA:  Intermittent slurred speech for several days and difficulty following commands. EXAM: MRI HEAD WITHOUT CONTRAST MRA HEAD WITHOUT CONTRAST TECHNIQUE: Multiplanar, multiecho pulse sequences of the brain and surrounding structures were obtained without intravenous contrast. Angiographic images of the head were obtained using MRA technique without contrast. COMPARISON:  Head CT 01/06/2015 FINDINGS: MRI HEAD FINDINGS Due to patient's altered mental status and inability to follow commands, a complete brain MRI was unable to be obtained. Axial and coronal diffusion and sagittal T1 weighted images were obtained. Axial T2* gradient echo images were also obtained but are severely motion degraded. Remaining routine sequences were not performed. There is a small acute posterior left MCA territory cortical infarct in the left parietal lobe. No midline shift or sizable extra-axial fluid collection is seen. Generalized cerebral atrophy is noted. There is a chronic left parietal  MCA infarct superior and posterior to the acute infarct, likely with some associated chronic blood products. A chronic left occipital infarct is also noted in addition to chronic small vessel ischemic disease in the cerebral white matter bilaterally. There are chronic bilateral cerebellar infarcts. Prior bilateral cataract extraction is noted. MRA HEAD FINDINGS The examination is mildly motion degraded. The visualized vertebral arteries are patent and codominant with at most mild narrowing distally. PICA and SCA origins are patent. Basilar artery is patent without evidence of significant stenosis. Posterior communicating arteries are not identified. PCAs are patent with mild irregularity bilaterally but no significant proximal stenosis. Focally abnormal appearance of the mid right P2 segment may reflect a combination of motion artifact, vessel tortuosity, and possibly underlying stenosis and would be an unusual appearance and location for aneurysm. The right internal carotid artery is patent from skullbase to carotid terminus. Slightly bulbous appearance of the proximal right supraclinoid ICA is felt to reflect motion artifact through the ophthalmic artery origin. No left ICA flow related enhancement is seen in the imaged portion of the upper neck  or intracranially, consistent with occlusion. There is reconstituted flow at the carotid terminus, and the left ACA and MCA origins are patent. The left M1 segment is patent, however there is occlusion of multiple left MCA branch vessels including one of the main M2 trunks. Right MCA is patent without evidence of significant stenosis or sizable branch vessel occlusion. There is a patent anterior communicating artery which may be fenestrated or duplicated. The left A2 segment is strongly dominant. IMPRESSION: 1. Motion degraded, incomplete examination as above. 2. Small, acute left parietal MCA territory infarct. 3. Chronic left parietal, left occipital common bilateral  cerebellar infarcts. Chronic small vessel ischemic disease in the cerebral white matter. 4. Occluded left internal carotid artery. 5. Patent left M1 segment.  Occlusion of multiple left MCA branches. Electronically Signed   By: Logan Bores M.D.   On: 01/06/2015 16:19   Mr Brain Wo Contrast  01/06/2015  CLINICAL DATA:  Intermittent slurred speech for several days and difficulty following commands. EXAM: MRI HEAD WITHOUT CONTRAST MRA HEAD WITHOUT CONTRAST TECHNIQUE: Multiplanar, multiecho pulse sequences of the brain and surrounding structures were obtained without intravenous contrast. Angiographic images of the head were obtained using MRA technique without contrast. COMPARISON:  Head CT 01/06/2015 FINDINGS: MRI HEAD FINDINGS Due to patient's altered mental status and inability to follow commands, a complete brain MRI was unable to be obtained. Axial and coronal diffusion and sagittal T1 weighted images were obtained. Axial T2* gradient echo images were also obtained but are severely motion degraded. Remaining routine sequences were not performed. There is a small acute posterior left MCA territory cortical infarct in the left parietal lobe. No midline shift or sizable extra-axial fluid collection is seen. Generalized cerebral atrophy is noted. There is a chronic left parietal MCA infarct superior and posterior to the acute infarct, likely with some associated chronic blood products. A chronic left occipital infarct is also noted in addition to chronic small vessel ischemic disease in the cerebral white matter bilaterally. There are chronic bilateral cerebellar infarcts. Prior bilateral cataract extraction is noted. MRA HEAD FINDINGS The examination is mildly motion degraded. The visualized vertebral arteries are patent and codominant with at most mild narrowing distally. PICA and SCA origins are patent. Basilar artery is patent without evidence of significant stenosis. Posterior communicating arteries are not  identified. PCAs are patent with mild irregularity bilaterally but no significant proximal stenosis. Focally abnormal appearance of the mid right P2 segment may reflect a combination of motion artifact, vessel tortuosity, and possibly underlying stenosis and would be an unusual appearance and location for aneurysm. The right internal carotid artery is patent from skullbase to carotid terminus. Slightly bulbous appearance of the proximal right supraclinoid ICA is felt to reflect motion artifact through the ophthalmic artery origin. No left ICA flow related enhancement is seen in the imaged portion of the upper neck or intracranially, consistent with occlusion. There is reconstituted flow at the carotid terminus, and the left ACA and MCA origins are patent. The left M1 segment is patent, however there is occlusion of multiple left MCA branch vessels including one of the main M2 trunks. Right MCA is patent without evidence of significant stenosis or sizable branch vessel occlusion. There is a patent anterior communicating artery which may be fenestrated or duplicated. The left A2 segment is strongly dominant. IMPRESSION: 1. Motion degraded, incomplete examination as above. 2. Small, acute left parietal MCA territory infarct. 3. Chronic left parietal, left occipital common bilateral cerebellar infarcts. Chronic small vessel ischemic disease  in the cerebral white matter. 4. Occluded left internal carotid artery. 5. Patent left M1 segment.  Occlusion of multiple left MCA branches. Electronically Signed   By: Logan Bores M.D.   On: 01/06/2015 16:19    Assessment: 81 y.o. male presenting with difficulty with speech.  MRI of the brain personally reviewed and shows a small left parietal MCA infarct.  Left ICA appears occluded as well as multiple left MCA branches.  Patient on no antiplatelet therapy at home.  Further work up recommended.    Stroke Risk Factors - hyperlipidemia and hypertension  Plan: 1. HgbA1c,  fasting lipid panel 2. Frequent neuro checks 3. PT consult, OT consult, Speech consult 4. Echocardiogram 5. Carotid dopplers 6. Prophylactic therapy-Antiplatelet med: Aspirin - dose 325mg  daily 7. NPO until RN stroke swallow screen 8. Telemetry monitoring   Alexis Goodell, MD [facility] [phone] 01/06/2015, 6:02 PM

## 2015-01-06 NOTE — ED Notes (Signed)
MD aware of BP

## 2015-01-06 NOTE — ED Notes (Signed)
MD at bedside. 

## 2015-01-06 NOTE — H&P (Signed)
[facility] History and Physical  Jc Veron RXV:[id number] DOB: [birth year] DOA: 01/06/2015  Referring physician: Dr Alberteen Spindle - [facility]  PCP: Nyoka Cowden, MD   Chief Complaint: Slurred speech  HPI: Howard Padilla is a 81 y.o. male   Level 5 caveat. With intermittent Slurred speech and aphasia and difficulty following commands.  History provided by patient, [facility] physician, wife, daughter and granddaughter. Patient lives at home with family. Patient typically able to perform ADLs up until onset of symptoms. Patient is typically alert and oriented 3. For the last several days patient has had intermittent episodes of slurred speech. He was evaluated in the ED for this with a negative workup and patient requesting to go home at that time. Most recent episode began this morning around 8 AM. Nothing makes the symptoms better. Nothing to his symptoms worse. Patient was last seen normal prior to bed last night. Denies chest pain, short of breath, palpitations, fevers, nausea, vomiting, loss of bowel or bladder function, fall, head trauma, cough, shortness of breath, dysuria.   Review of Systems:  Unable to obtain further review of systems due to patient's mental status and lack of family access for history giving. Per history of present illness with all other systems negative.   Past Medical History  Diagnosis Date  . Cancer ([place])     colon  . Hyperlipidemia   . Hypertension   . BPH (benign prostatic hypertrophy)   . DVT (deep venous thrombosis) ([place])     with pulmonary embo.   Past Surgical History  Procedure Laterality Date  . Colon surgery      partial colectomy  . Hernia repair      ingunial   Social History:  reports that he quit smoking about 31 years ago. His smoking use included Cigarettes. He has a 60 pack-year smoking history. He has never used smokeless tobacco. He reports that he does not drink alcohol or use illicit drugs.  Allergies  Allergen  Reactions  . Penicillins Rash    Has patient had a PCN reaction causing immediate rash, facial/tongue/throat swelling, SOB or lightheadedness with hypotension: No Has patient had a PCN reaction causing severe rash involving mucus membranes or skin necrosis: No Has patient had a PCN reaction that required hospitalization No Has patient had a PCN reaction occurring within the last 10 years: No If all of the above answers are "NO", then may proceed with Cephalosporin use.     Family History  Problem Relation Age of Onset  . Family history unknown: Yes     Prior to Admission medications   Medication Sig Start Date End Date Taking? Authorizing Provider  doxazosin (CARDURA) 4 MG tablet TAKE ONE TABLET BY MOUTH AT BEDTIME 08/27/14  Yes [name], MD  fosinopril (MONOPRIL) 20 MG tablet TAKE ONE TABLET BY MOUTH ONCE DAILY 11/03/14  Yes [name], MD  Multiple Vitamin (MULTIVITAMIN WITH MINERALS) TABS tablet Take 1 tablet by mouth daily.   Yes Historical Provider, MD  acetaminophen (TYLENOL) 500 MG tablet Take 2 tablets (1,000 mg total) by mouth every 6 (six) hours as needed. Patient not taking: Reported on 01/06/2015 12/09/14   Charlesetta Shanks, MD  orphenadrine (NORFLEX) 100 MG tablet Take 1 tablet (100 mg total) by mouth 2 (two) times daily. Patient not taking: Reported on 01/06/2015 12/09/14   Charlesetta Shanks, MD   Physical Exam: Filed Vitals:   01/06/15 1330 01/06/15 1345 01/06/15 1415 01/06/15 1630  BP:  171/110 160/108 196/110  Pulse: 69  88 80 84  Temp:      TempSrc:      Resp: 23 18 15 19   Height:      Weight:      SpO2: 99% 98% 98% 100%    Wt Readings from Last 3 Encounters:  01/06/15 61.236 kg (135 lb)  12/25/14 61.236 kg (135 lb)  12/09/14 61.236 kg (135 lb)     General:  Appears calm and comfortable Eyes: EOMI, normal lids,  ENT: Dry MM Neck:  no LAD, masses or thyromegaly Cardiovascular:  RRR, 2+ pedal pulses. No LE edema.  Respiratory: Nml effort, on  RA, no respiratory distress Abdomen:  soft, ntnd Skin:  no rash or induration seen on limited exam Musculoskeletal: Limited exam due to pts presentation. Moves all extremities on command. No appreciable effusions.  Psychiatric: Initially patient alert and oriented 0 after reevaluation alert and oriented 3 and follows commands intermittently.Marland Kitchen  Neurologic: Limited exam due to pt not following commands.  CN 2-12 grossly intact, moves all extremities in coordinated fashion.             Labs on Admission:  Basic Metabolic Panel:  Recent Labs Lab 01/06/15 0840 01/06/15 0844  NA 140 140  K 4.6 4.6  CL 107 106  CO2 23  --   GLUCOSE 100* 92  BUN 19 25*  CREATININE 1.32* 1.40*  CALCIUM 9.4  --    Liver Function Tests:  Recent Labs Lab 01/06/15 0840  AST 20  ALT 14*  ALKPHOS 101  BILITOT 0.7  PROT 7.2  ALBUMIN 3.6   No results for input(s): LIPASE, AMYLASE in the last 168 hours. No results for input(s): AMMONIA in the last 168 hours. CBC:  Recent Labs Lab 01/06/15 0840 01/06/15 0844  WBC 3.9*  --   NEUTROABS 2.4  --   HGB 12.4* 13.6  HCT 37.4* 40.0  MCV 93.0  --   PLT 171  --    Cardiac Enzymes: No results for input(s): CKTOTAL, CKMB, CKMBINDEX, TROPONINI in the last 168 hours.  BNP (last 3 results) No results for input(s): BNP in the last 8760 hours.  ProBNP (last 3 results) No results for input(s): PROBNP in the last 8760 hours.  CBG: No results for input(s): GLUCAP in the last 168 hours.  Radiological Exams on Admission: Ct Head Wo Contrast  01/06/2015  CLINICAL DATA:  Slurred speech and generalize weakness. EXAM: CT HEAD WITHOUT CONTRAST TECHNIQUE: Contiguous axial images were obtained from the base of the skull through the vertex without intravenous contrast. COMPARISON:  12/09/2014 FINDINGS: Sinuses/Soft tissues: Clear paranasal sinuses and mastoid air cells. Intracranial: Normal cerebral volume for age. Moderate-to-marked low density in the  periventricular white matter likely related to small vessel disease. 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 Chronic. No mass lesion, hemorrhage, hydrocephalus, acute infarct, intra-axial, or extra-axial fluid collection. IMPRESSION: 1.  No acute intracranial abnormality. 2.  Cerebral atrophy and small vessel ischemic change. Electronically Signed   By: Abigail Miyamoto M.D.   On: 01/06/2015 10:58   Mr Angiogram Head Wo Contrast  01/06/2015  CLINICAL DATA:  Intermittent slurred speech for several days and difficulty following commands. EXAM: MRI HEAD WITHOUT CONTRAST MRA HEAD WITHOUT CONTRAST TECHNIQUE: Multiplanar, multiecho pulse sequences of the brain and surrounding structures were obtained without intravenous contrast. Angiographic images of the head were obtained using MRA technique without contrast. COMPARISON:  Head CT 01/06/2015 FINDINGS: MRI HEAD FINDINGS Due to patient's altered mental  status and inability to follow commands, a complete brain MRI was unable to be obtained. Axial and coronal diffusion and sagittal T1 weighted images were obtained. Axial T2* gradient echo images were also obtained but are severely motion degraded. Remaining routine sequences were not performed. There is a small acute posterior left MCA territory cortical infarct in the left parietal lobe. No midline shift or sizable extra-axial fluid collection is seen. Generalized cerebral atrophy is noted. There is a chronic left parietal MCA infarct superior and posterior to the acute infarct, likely with some associated chronic blood products. A chronic left occipital infarct is also noted in addition to chronic small vessel ischemic disease in the cerebral white matter bilaterally. There are chronic bilateral cerebellar infarcts. Prior bilateral cataract extraction is noted. MRA HEAD FINDINGS The examination is mildly motion degraded. The visualized vertebral arteries are patent  and codominant with at most mild narrowing distally. PICA and SCA origins are patent. Basilar artery is patent without evidence of significant stenosis. Posterior communicating arteries are not identified. PCAs are patent with mild irregularity bilaterally but no significant proximal stenosis. Focally abnormal appearance of the mid right P2 segment may reflect a combination of motion artifact, vessel tortuosity, and possibly underlying stenosis and would be an unusual appearance and location for aneurysm. The right internal carotid artery is patent from skullbase to carotid terminus. Slightly bulbous appearance of the proximal right supraclinoid ICA is felt to reflect motion artifact through the ophthalmic artery origin. No left ICA flow related enhancement is seen in the imaged portion of the upper neck or intracranially, consistent with occlusion. There is reconstituted flow at the carotid terminus, and the left ACA and MCA origins are patent. The left M1 segment is patent, however there is occlusion of multiple left MCA branch vessels including one of the main M2 trunks. Right MCA is patent without evidence of significant stenosis or sizable branch vessel occlusion. There is a patent anterior communicating artery which may be fenestrated or duplicated. The left A2 segment is strongly dominant. IMPRESSION: 1. Motion degraded, incomplete examination as above. 2. Small, acute left parietal MCA territory infarct. 3. Chronic left parietal, left occipital common bilateral cerebellar infarcts. Chronic small vessel ischemic disease in the cerebral white matter. 4. Occluded left internal carotid artery. 5. Patent left M1 segment.  Occlusion of multiple left MCA branches. Electronically Signed   By: Logan Bores M.D.   On: 01/06/2015 16:19   Mr Brain Wo Contrast  01/06/2015  CLINICAL DATA:  Intermittent slurred speech for several days and difficulty following commands. EXAM: MRI HEAD WITHOUT CONTRAST MRA HEAD WITHOUT  CONTRAST TECHNIQUE: Multiplanar, multiecho pulse sequences of the brain and surrounding structures were obtained without intravenous contrast. Angiographic images of the head were obtained using MRA technique without contrast. COMPARISON:  Head CT 01/06/2015 FINDINGS: MRI HEAD FINDINGS Due to patient's altered mental status and inability to follow commands, a complete brain MRI was unable to be obtained. Axial and coronal diffusion and sagittal T1 weighted images were obtained. Axial T2* gradient echo images were also obtained but are severely motion degraded. Remaining routine sequences were not performed. There is a small acute posterior left MCA territory cortical infarct in the left parietal lobe. No midline shift or sizable extra-axial fluid collection is seen. Generalized cerebral atrophy is noted. There is a chronic left parietal MCA infarct superior and posterior to the acute infarct, likely with some associated chronic blood products. A chronic left occipital infarct is also noted in addition to chronic  small vessel ischemic disease in the cerebral white matter bilaterally. There are chronic bilateral cerebellar infarcts. Prior bilateral cataract extraction is noted. MRA HEAD FINDINGS The examination is mildly motion degraded. The visualized vertebral arteries are patent and codominant with at most mild narrowing distally. PICA and SCA origins are patent. Basilar artery is patent without evidence of significant stenosis. Posterior communicating arteries are not identified. PCAs are patent with mild irregularity bilaterally but no significant proximal stenosis. Focally abnormal appearance of the mid right P2 segment may reflect a combination of motion artifact, vessel tortuosity, and possibly underlying stenosis and would be an unusual appearance and location for aneurysm. The right internal carotid artery is patent from skullbase to carotid terminus. Slightly bulbous appearance of the proximal right  supraclinoid ICA is felt to reflect motion artifact through the ophthalmic artery origin. No left ICA flow related enhancement is seen in the imaged portion of the upper neck or intracranially, consistent with occlusion. There is reconstituted flow at the carotid terminus, and the left ACA and MCA origins are patent. The left M1 segment is patent, however there is occlusion of multiple left MCA branch vessels including one of the main M2 trunks. Right MCA is patent without evidence of significant stenosis or sizable branch vessel occlusion. There is a patent anterior communicating artery which may be fenestrated or duplicated. The left A2 segment is strongly dominant. IMPRESSION: 1. Motion degraded, incomplete examination as above. 2. Small, acute left parietal MCA territory infarct. 3. Chronic left parietal, left occipital common bilateral cerebellar infarcts. Chronic small vessel ischemic disease in the cerebral white matter. 4. Occluded left internal carotid artery. 5. Patent left M1 segment.  Occlusion of multiple left MCA branches. Electronically Signed   By: Logan Bores M.D.   On: 01/06/2015 16:19    EKG: Independently reviewed. Sinus. posible LAFB, no ACS  Assessment/Plan Active Problems:   HLD (hyperlipidemia)   Essential hypertension   BPH (benign prostatic hyperplasia)   Aphasia   Stroke [facility])  Stroke: patient with waxing and waning symptoms despite evidence of multiple chronic and acute strokes as noted above. Lengthy discussion had with patient, patient's wife, patient's daughter and granddaughter regarding inability to reverse stroke and that his treatment at this time becomes an effort to find the location of her etiology of the stroke and identify any mitigating factors. Discussed likelihood of progression of stroke symptoms and how they will likely become permanent though there is a chance that they will resolve. ED consult to neurology who will follow. Patient outside the window and  not a candidate for TPA - Telemetry - PT/OT - Echo - Lipids, A1c - ASA - Anticipate discharge to skilled nursing facility versus home with home health - Swallow study  Hypertension: Elevated. Allow permissive HTN w/ goal <220/120.  - hold ACEi - Hydralazine PRN  Hyperlipidemia: listed on Chart but no treatment - Lipid panel  BPH: - cont cardura   Code Status: FULL - readdress as admission progresses or at time of DC as pt condition likely to deteriorate  DVT Prophylaxis: Hep Family Communication: Wife, daughter, and granddaughter  Disposition Plan:  Pending Improvement    MERRELL, DAVID Lenna Sciara, MD Family Medicine [facility] [email or web address] Password [email or web address]

## 2015-01-06 NOTE — ED Notes (Signed)
Pt presents via [facility] from home with c/o slurred speech, generalized weakness, and increased urination.  Per EMS wife discovered slurred speech this morning around 0730 when he woke up, LSN last night when he went to bed at 1800.  Per EMS pt has been seen for similar symptoms a month ago at [facility].  Pt reports not feeling well.  BP-180/108 P-94 NSR, O2-95% RA, CBG-94, EKG unremarkable.  Pt alert to self, slurred speech on arrival.  Per EMS pt was ambulatory without assistance this morning with them.  Hx: HTN.  18g LFA.

## 2015-01-06 NOTE — ED Notes (Signed)
Pt attempting to urinate at this time

## 2015-01-06 NOTE — ED Notes (Signed)
Ordered diet tray 

## 2015-01-06 NOTE — ED Notes (Signed)
Family at bedside. 

## 2015-01-06 NOTE — Discharge Instructions (Signed)
You were seen today because you're speech has been off.  This comes and goes.  We did an MRI today and had you seen by internal medicine. They want you to follow up with your primary doctor.  If you develop the difficulty speaking please return to the ED, as it may be an acute stroke.

## 2015-01-06 NOTE — ED Provider Notes (Addendum)
CSN: [id number]     Arrival date & time 01/06/15  9163 History   First MD Initiated Contact with Patient 01/06/15 [phone]     Chief Complaint  Patient presents with  . Aphasia     (Consider location/radiation/quality/duration/timing/severity/associated sxs/prior Treatment) HPI   Patient is a 81 year old male. He presents today with slurred speech. His wife says it has been his last known normal was last night when they're watching left shin. His granddaughter says she's been having slurred speech for several days. Patient had multiple ED visits in the last couple months. He was most recetly seen for issues with urination retention at [facility]. He followed up with [facility] who took out his [place] since then wife reports he's been urinating very frequently.  Wife denies any fever, nausea vomiting or diarrhea.  Level V caveat dementia.  Past Medical History  Diagnosis Date  . Cancer ([place])     colon  . Hyperlipidemia   . Hypertension   . BPH (benign prostatic hypertrophy)   . DVT (deep venous thrombosis) ([place])     with pulmonary embo.   Past Surgical History  Procedure Laterality Date  . Colon surgery      partial colectomy  . Hernia repair      ingunial   No family history on file. Social History  Substance Use Topics  . Smoking status: Former Smoker -- 2.00 packs/day for 30 years    Types: Cigarettes    Quit date: 02/28/1983  . Smokeless tobacco: Never Used  . Alcohol Use: No    Review of Systems  Unable to perform ROS: Dementia  Allergic/Immunologic: Negative for immunocompromised state.  Psychiatric/Behavioral: Positive for confusion.  All other systems reviewed and are negative.     Allergies  Penicillins  Home Medications   Prior to Admission medications   Medication Sig Start Date End Date Taking? Authorizing Provider  doxazosin (CARDURA) 4 MG tablet TAKE ONE TABLET BY MOUTH AT BEDTIME 08/27/14  Yes [name], MD  fosinopril (MONOPRIL) 20 MG  tablet TAKE ONE TABLET BY MOUTH ONCE DAILY 11/03/14  Yes [name], MD  Multiple Vitamin (MULTIVITAMIN WITH MINERALS) TABS tablet Take 1 tablet by mouth daily.   Yes Historical Provider, MD  acetaminophen (TYLENOL) 500 MG tablet Take 2 tablets (1,000 mg total) by mouth every 6 (six) hours as needed. Patient not taking: Reported on 01/06/2015 12/09/14   Charlesetta Shanks, MD  orphenadrine (NORFLEX) 100 MG tablet Take 1 tablet (100 mg total) by mouth 2 (two) times daily. Patient not taking: Reported on 01/06/2015 12/09/14   Charlesetta Shanks, MD   BP 178/104 mmHg  Pulse 81  Temp(Src) 98.1 F (36.7 C) (Oral)  Resp 17  Ht 6' (1.829 m)  Wt 135 lb (61.236 kg)  BMI 18.31 kg/m2  SpO2 98% Physical Exam  Constitutional: He appears well-nourished.  Frail African-American male.  HENT:  Head: Normocephalic.  Mouth/Throat: Oropharynx is clear and moist.  Dry mucous membranes  Eyes: Conjunctivae are normal.  Neck: No tracheal deviation present.  Cardiovascular: Normal rate.   Pulmonary/Chest: Effort normal. No stridor. No respiratory distress.  Abdominal: Soft. There is no tenderness. There is no guarding.  Musculoskeletal: Normal range of motion. He exhibits no edema.  Neurological: No cranial nerve deficit.  Patient able to follow simple commands if repeated multiple times.\  No pronator drift. Able to move all 4 extremities. Cranial nerves appear intact.  Patient has halting repetitive speech. Mildly slurred could be secondary to edentulous  state.  Skin: Skin is dry. No rash noted. He is not diaphoretic.  Psychiatric: He has a normal mood and affect.  Nursing note and vitals reviewed.   ED Course  Procedures (including critical care time) Labs Review Labs Reviewed  CBC - Abnormal; Notable for the following:    WBC 3.9 (*)    RBC 4.02 (*)    Hemoglobin 12.4 (*)    HCT 37.4 (*)    All other components within normal limits  COMPREHENSIVE METABOLIC PANEL - Abnormal; Notable for the  following:    Glucose, Bld 100 (*)    Creatinine, Ser 1.32 (*)    ALT 14 (*)    GFR calc non Af Amer 43 (*)    GFR calc Af Amer 50 (*)    All other components within normal limits  I-STAT CHEM 8, ED - Abnormal; Notable for the following:    BUN 25 (*)    Creatinine, Ser 1.40 (*)    All other components within normal limits  PROTIME-INR  APTT  DIFFERENTIAL  URINALYSIS, ROUTINE W REFLEX MICROSCOPIC (NOT AT [facility])  I-STAT TROPOININ, ED    Imaging Review No results found. I have personally reviewed and evaluated these images and lab results as part of my medical decision-making.   EKG Interpretation   Date/Time:  Wednesday January 06 2015 08:11:01 EST Ventricular Rate:  82 PR Interval:  190 QRS Duration: 105 QT Interval:  390 QTC Calculation: 455 R Axis:   -68 Text Interpretation:  Sinus rhythm Left anterior fascicular block Abnormal  R-wave progression, early transition Probable anteroseptal infarct, old no  acute ischemia No significant change since last tracing Confirmed by  Gerald Leitz ([id number]) on 01/06/2015 9:30:19 AM      MDM   Final diagnoses:  None    Patient is a 81 year old male presenting with increasing number of visits the emergency department and questionable of slurred speech. Granddaughter reports that he's been having 3 days of increasingly difficult to understand speech. Wife reports that he was normal last night when they're watching the election. She reports that when she went up to see him this morning he was in the bathroom and he had wet himself. She reports that then directly prior to arrival she noticed that he had some slurred speech and so called EMS. Patient was seen recently by primary care physician who increased the home care offerings  Given the fact this is been going on at least since last night, he does not meet code stroke protocol. Concern could be metabolic cause for this given his mild decrease in functionality. We will get  electrolytes, UA, chest x-ray. In addition we will continue to workup for stroke including CT, EKG.  2:31 PM   Patient is at baseline. It appears that this "trouble speaking" is coming and going.Discussd with neurology and medicine. Please see medicines note. Medicine came to see patient and family and discussed that even if patient had MRI that was positive for stroke there would not be many additional changes we would make. Medicine ordered hemoglobin A1c and lipids. Patient's primary care we'll follow this up. Patient became tearful in the room asking to go home. Family feels they can take care of him at home.  Discussed with case management. They have come to see patient already and have set him up with home care services.  We will await MRI and have patietn follow up with PCP.   Jaxtin Raimondo Julio Alm, MD 01/06/15 1434  4:47 PM  Patient had stroke.  Will consult neurology.  Given the acute stroke, dicsussed with family inpatient vs outpatient. We came to agreement that observation admission would be reasonable to make sure he does not progress while respecting patietn's wishes to be at home. By tomorrow home health will be set up and wife (and primary caretaker herself) will feel more comofrtabel to take patietn home.   Kateryna Grantham Julio Alm, MD 01/06/15 [place], MD 01/06/15 [id number]

## 2015-01-07 ENCOUNTER — Ambulatory Visit (HOSPITAL_BASED_OUTPATIENT_CLINIC_OR_DEPARTMENT_OTHER): Payer: Medicare Other

## 2015-01-07 ENCOUNTER — Ambulatory Visit (HOSPITAL_COMMUNITY): Payer: Medicare Other

## 2015-01-07 DIAGNOSIS — I63519 Cerebral infarction due to unspecified occlusion or stenosis of unspecified middle cerebral artery: Secondary | ICD-10-CM | POA: Diagnosis present

## 2015-01-07 DIAGNOSIS — Z85038 Personal history of other malignant neoplasm of large intestine: Secondary | ICD-10-CM | POA: Diagnosis not present

## 2015-01-07 DIAGNOSIS — I10 Essential (primary) hypertension: Secondary | ICD-10-CM | POA: Diagnosis not present

## 2015-01-07 DIAGNOSIS — I639 Cerebral infarction, unspecified: Secondary | ICD-10-CM | POA: Diagnosis not present

## 2015-01-07 DIAGNOSIS — Z87891 Personal history of nicotine dependence: Secondary | ICD-10-CM | POA: Diagnosis not present

## 2015-01-07 DIAGNOSIS — R4781 Slurred speech: Secondary | ICD-10-CM | POA: Diagnosis not present

## 2015-01-07 DIAGNOSIS — Z86711 Personal history of pulmonary embolism: Secondary | ICD-10-CM | POA: Diagnosis not present

## 2015-01-07 DIAGNOSIS — Z79899 Other long term (current) drug therapy: Secondary | ICD-10-CM | POA: Diagnosis not present

## 2015-01-07 DIAGNOSIS — N400 Enlarged prostate without lower urinary tract symptoms: Secondary | ICD-10-CM | POA: Diagnosis present

## 2015-01-07 DIAGNOSIS — I63132 Cerebral infarction due to embolism of left carotid artery: Secondary | ICD-10-CM | POA: Diagnosis not present

## 2015-01-07 DIAGNOSIS — I6789 Other cerebrovascular disease: Secondary | ICD-10-CM | POA: Diagnosis not present

## 2015-01-07 DIAGNOSIS — R4701 Aphasia: Secondary | ICD-10-CM

## 2015-01-07 DIAGNOSIS — E785 Hyperlipidemia, unspecified: Secondary | ICD-10-CM | POA: Diagnosis not present

## 2015-01-07 DIAGNOSIS — R471 Dysarthria and anarthria: Secondary | ICD-10-CM | POA: Diagnosis present

## 2015-01-07 DIAGNOSIS — I638 Other cerebral infarction: Secondary | ICD-10-CM | POA: Diagnosis not present

## 2015-01-07 DIAGNOSIS — Z86718 Personal history of other venous thrombosis and embolism: Secondary | ICD-10-CM | POA: Diagnosis not present

## 2015-01-07 DIAGNOSIS — I6522 Occlusion and stenosis of left carotid artery: Secondary | ICD-10-CM | POA: Diagnosis present

## 2015-01-07 LAB — CBC
HCT: 36.9 % — ABNORMAL LOW (ref 39.0–52.0)
Hemoglobin: 12.3 g/dL — ABNORMAL LOW (ref 13.0–17.0)
MCH: 30.7 pg (ref 26.0–34.0)
MCHC: 33.3 g/dL (ref 30.0–36.0)
MCV: 92 fL (ref 78.0–100.0)
PLATELETS: 172 10*3/uL (ref 150–400)
RBC: 4.01 MIL/uL — ABNORMAL LOW (ref 4.22–5.81)
RDW: 14.8 % (ref 11.5–15.5)
WBC: 3.6 10*3/uL — AB (ref 4.0–10.5)

## 2015-01-07 LAB — BASIC METABOLIC PANEL
ANION GAP: 9 (ref 5–15)
BUN: 20 mg/dL (ref 6–20)
CALCIUM: 9 mg/dL (ref 8.9–10.3)
CO2: 23 mmol/L (ref 22–32)
CREATININE: 1.29 mg/dL — AB (ref 0.61–1.24)
Chloride: 104 mmol/L (ref 101–111)
GFR, EST AFRICAN AMERICAN: 52 mL/min — AB (ref >60)
GFR, EST NON AFRICAN AMERICAN: 45 mL/min — AB (ref >60)
Glucose, Bld: 89 mg/dL (ref 65–99)
Potassium: 4 mmol/L (ref 3.5–5.1)
SODIUM: 136 mmol/L (ref 135–145)

## 2015-01-07 LAB — HEMOGLOBIN A1C
Hgb A1c MFr Bld: 6 % — ABNORMAL HIGH (ref 4.8–5.6)
Mean Plasma Glucose: 126 mg/dL

## 2015-01-07 MED ORDER — PRAVASTATIN SODIUM 20 MG PO TABS
20.0000 mg | ORAL_TABLET | Freq: Every day | ORAL | Status: DC
  Administered 2015-01-07: 20 mg via ORAL
  Filled 2015-01-07: qty 1

## 2015-01-07 NOTE — Evaluation (Signed)
Physical Therapy Evaluation Patient Details Name: Howard Padilla MRN: [id number] DOB: [birth year] Today's Date: 01/07/2015   History of Present Illness  Patient is a 81 y/o male presents with slurred speech. MRI/MRA brain-small left parietal MCA infarct.Left ICA appears occluded as well as multiple left MCA branches. PMH includes HTN, HLD, DVT, Colon ca.   Clinical Impression  Patient presents with balance deficits and speech impairments s/p CVA. Tolerated standing and ambulation with use of RW and Min A for balance/safety. Pt with poor safety awareness. Family reports they can provide 24/7 S/assist at home. Recommend use of RW at home for balance even though family asked about a cane. Will follow acutely to maximize independence and mobility prior to return home.     Follow Up Recommendations Home health PT;Supervision/Assistance - 24 hour    Equipment Recommendations  Rolling walker with 5" wheels    Recommendations for Other Services OT consult     Precautions / Restrictions Precautions Precautions: Fall Restrictions Weight Bearing Restrictions: No      Mobility  Bed Mobility               General bed mobility comments: Sitting in chair upon PT arrival.   Transfers Overall transfer level: Needs assistance Equipment used: None Transfers: Sit to/from Stand Sit to Stand: Min guard         General transfer comment: Min guard assist for safety. Holding onto wall upon standing.   Ambulation/Gait Ambulation/Gait assistance: Min assist Ambulation Distance (Feet): 150 Feet Assistive device: Rolling walker (2 wheeled) Gait Pattern/deviations: Step-through pattern;Decreased stride length;Shuffle;Drifts right/left;Staggering right;Staggering left;Trunk flexed Gait velocity: decreased   General Gait Details: Short shuffling steps initially when navigating small spaces. Cues for RW management. Right lateral lean noted at times requiring assist to prevent  LOB.  Stairs            Wheelchair Mobility    Modified Rankin (Stroke Patients Only) Modified Rankin (Stroke Patients Only) Pre-Morbid Rankin Score: Moderate disability Modified Rankin: Moderately severe disability     Balance Overall balance assessment: Needs assistance Sitting-balance support: Feet supported;No upper extremity supported Sitting balance-Leahy Scale: Fair     Standing balance support: During functional activity Standing balance-Leahy Scale: Poor Standing balance comment: Relient on external support for balance. Right lateral lean noted at times duringstatic and dynamic standing.                              Pertinent Vitals/Pain Pain Assessment: No/denies pain    Home Living Family/patient expects to be discharged to:: Private residence Living Arrangements: Spouse/significant other;Children Available Help at Discharge: Family;Available 24 hours/day Type of Home: House Home Access: Level entry     Home Layout: One level Home Equipment: None      Prior Function Level of Independence: Independent         Comments: Per spouse, pt independent for ADls and ambulation at baseline. Furniture walker.      Hand Dominance   Dominant Hand: Right (pt eating with right hand)    Extremity/Trunk Assessment   Upper Extremity Assessment: Defer to OT evaluation           Lower Extremity Assessment: Generalized weakness         Communication   Communication: Expressive difficulties (Dysarthria noted. Difficult to understand at times. )  Cognition Arousal/Alertness: Awake/alert Behavior During Therapy: WFL for tasks assessed/performed Overall Cognitive Status: No family/caregiver present to determine baseline cognitive functioning Area of  Impairment: Following commands;Safety/judgement;Orientation Orientation Level:  (Able to state he was in hospital when given options. )     Following Commands: Follows one step commands  inconsistently;Follows one step commands with increased time Safety/Judgement: Decreased awareness of safety     General Comments: Able to state who wife was with some difficulty and time. Some aphasia noted at times with word finding difficulties.     General Comments General comments (skin integrity, edema, etc.): Wife and son arrived at end of session.    Exercises        Assessment/Plan    PT Assessment Patient needs continued PT services  PT Diagnosis Difficulty walking   PT Problem List Decreased strength;Decreased cognition;Decreased balance;Decreased mobility;Decreased knowledge of use of DME;Decreased activity tolerance;Decreased safety awareness  PT Treatment Interventions Balance training;Gait training;Functional mobility training;Therapeutic activities;Therapeutic exercise;Patient/family education;Neuromuscular re-education;DME instruction   PT Goals (Current goals can be found in the Care Plan section) Acute Rehab PT Goals Patient Stated Goal: to go home PT Goal Formulation: With patient Time For Goal Achievement: 01/21/15 Potential to Achieve Goals: Fair    Frequency Min 4X/week   Barriers to discharge        Co-evaluation               End of Session Equipment Utilized During Treatment: Gait belt Activity Tolerance: Patient tolerated treatment well Patient left: in chair;with call bell/phone within reach;with chair alarm set;with family/visitor present Nurse Communication: Mobility status    Functional Assessment Tool Used: Clinical judgment Functional Limitation: Mobility: Walking and moving around Mobility: Walking and Moving Around Current Status [phone]): At least 20 percent but less than 40 percent impaired, limited or restricted Mobility: Walking and Moving Around Goal Status [phone]): At least 1 percent but less than 20 percent impaired, limited or restricted    Time: 0956-1015 PT Time Calculation (min) (ACUTE ONLY): 19 min   Charges:   PT  Evaluation $Initial PT Evaluation Tier I: 1 Procedure     PT G Codes:   PT G-Codes **NOT FOR INPATIENT CLASS** Functional Assessment Tool Used: Clinical judgment Functional Limitation: Mobility: Walking and moving around Mobility: Walking and Moving Around Current Status [id number]): At least 20 percent but less than 40 percent impaired, limited or restricted Mobility: Walking and Moving Around Goal Status [phone]): At least 1 percent but less than 20 percent impaired, limited or restricted    [place] 01/07/2015, 11:44 AM Wray Kearns, PT, DPT [phone]

## 2015-01-07 NOTE — Evaluation (Addendum)
Speech Language Pathology Evaluation Patient Details Name: Howard Padilla MRN: [id number] DOB: 1915-03-15 Today's Date: 01/07/2015 Time: [id number] SLP Time Calculation (min) (ACUTE ONLY): 23 min  Problem List:  Patient Active Problem List   Diagnosis Date Noted  . Aphasia 01/06/2015  . Stroke ([place]) 01/06/2015  . Slurred speech   . TIA (transient ischemic attack)   . Urinary retention 12/25/2014  . Lung nodule 06/24/2013  . CAP (community acquired pneumonia) 06/23/2013  . 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 Lung abscess ([place]) 06/22/2013  . CKD (chronic kidney disease) stage 3, GFR 30-59 ml/min 06/22/2013  . OTHER ABNORMAL BLOOD CHEMISTRY 02/03/2010  . PERSONAL HISTORY OF THROMBOPHLEBITIS 02/03/2010  . WEIGHT LOSS 10/29/2008  . DVT 06/24/2007  . LEG PAIN, LEFT 05/28/2007  . SYNCOPE 05/28/2007  . ACUTE BRONCHITIS 05/25/2007  . Anemia of renal disease 11/05/2006  . HLD (hyperlipidemia) 07/30/2006  . Essential hypertension 07/30/2006  . BPH (benign prostatic hyperplasia) 07/30/2006  . History of malignant neoplasm of large intestine 07/30/2006   Past Medical History:  Past Medical History  Diagnosis Date  . Cancer ([place])     colon  . Hyperlipidemia   . Hypertension   . BPH (benign prostatic hypertrophy)   . DVT (deep venous thrombosis) ([place])     with pulmonary embo.   Past Surgical History:  Past Surgical History  Procedure Laterality Date  . Colon surgery      partial colectomy  . Hernia repair      ingunial   HPI:  81 yo male adm to [facility] with intermittent dysphasia, MRI showed left parietal MCA CVA.  Pt with PMH + for colon cancer, bronchitis, CAP, lung nodule,  BPH, DVT with pulmonary embolism,. Per review of chart, pt feeds himself, dresses himself at home and spends the day watching tv.   No family present at this time.  Speech evaluation ordered.    Assessment / Plan / Recommendation Clinical Impression  Pt presens with significant cognitive linguistic  deficits and mild dysarthria.  He was oriented to self only and able to intermittently follow one step commands.   Pt did not name items confrontationally indepedently but was able to name 75% with sentence completion cues.  ? hearing loss and/or ? if pt was distracted by getting set up to eat.    Pt benefited from moderate verbal  to identiy need to use call bell due to fall risk and was able to point to call bell at end of session within 30 seconds of review.  Concern for generalization of task present.  Skilled intervention included use of compensations for cognitive linguistic deficits.    He did not recall that SLP left to obtain juice and straw for pt after cued to recall- stating I was to bring him coffee - indicating memory deficits.    Pt able to verbalize basic functional needs but beyond this level demonstrates difficulties.    Unfortunately, family not present to establish baseline.  Pt will benefit from skilled SLP to maximize his cognitive linguistic skills to decrease caregiver burden.        SLP Assessment  Patient needs continued Speech Lanaguage Pathology Services    Follow Up Recommendations    TBD   Frequency and Duration min 2x/week  2 weeks      SLP Evaluation Prior Functioning  Cognitive/Linguistic Baseline: Information not available (no family present at this time)   Cognition  Overall Cognitive Status: No family/caregiver present to determine baseline cognitive functioning Arousal/Alertness: Awake/alert  Orientation Level: Oriented to person;Disoriented to time;Disoriented to situation;Disoriented to place Attention: Focused;Sustained Focused Attention: Appears intact Sustained Attention: Impaired Sustained Attention Impairment: Functional basic Memory: Impaired Memory Impairment: Storage deficit;Retrieval deficit;Decreased recall of new information (pt did not recall slp obtaining juice and straws for him, needed multiple cues to recall to use call bell for  assist) Awareness: Impaired (pt states he can get up on his own) Awareness Impairment: Intellectual impairment Problem Solving: Impaired Problem Solving Impairment: Functional basic Behaviors: Impulsive Safety/Judgment: Impaired    Comprehension  Auditory Comprehension Overall Auditory Comprehension: Impaired Yes/No Questions: Not tested Commands: Impaired One Step Basic Commands: 50-74% accurate Conversation: Simple Interfering Components: Attention;Motor planning;Processing speed;Working memory EffectiveTechniques: [employer];Extra processing time Visual Recognition/Discrimination Discrimination: Not tested Reading Comprehension Reading Status: Impaired (? visual acuity deficit - pt did not read word on creamer- given he watches tv most of the day - reading may not be important to him) Word level: Impaired Sentence Level: Not tested Paragraph Level: Not tested Functional Environmental (signs, name badge): Not tested    Expression Expression Primary Mode of Expression: Verbal Verbal Expression Overall Verbal Expression: Impaired Initiation: Impaired Level of Generative/Spontaneous Verbalization: Sentence Repetition: Impaired (pt will repeat single words inconsistently) Level of Impairment: Word level Naming: Impairment Responsive: 76-100% accurate Confrontation: Impaired (naming items - approximately 80% accuracy with phrase completion cues - ) Pragmatics:  (uncertain, turn taking impaired) Effective Techniques: Sentence completion;Phonemic cues Non-Verbal Means of Communication: Not applicable Written Expression Dominant Hand: Right (pt eating with right hand) Written Expression: Not tested   Oral / Motor Oral Motor/Sensory Function Overall Oral Motor/Sensory Function: Within functional limits Motor Speech Overall Motor Speech: Appears within functional limits for tasks assessed Respiration: Within functional limits Phonation: Normal Resonance:  Within functional limits Articulation: Impaired Level of Impairment: Phrase Intelligibility: Intelligibility reduced Word: 75-100% accurate Phrase: 75-100% accurate Sentence: 50-74% accurate Conversation: Not tested Motor Planning:  (could not determine) Effective Techniques: Slow rate;Increased vocal intensity    Janett Labella [place], [place] [facility] SLP [phone]

## 2015-01-07 NOTE — Progress Notes (Signed)
[facility]  Patient Status: New  [facility] is providing the following services: RN, PT and HHA  If patient discharges after hours, please call [phone].   Howard Padilla 01/07/2015, 9:42 AM

## 2015-01-07 NOTE — Progress Notes (Signed)
   01/07/15 0958  SLP G-Codes **NOT FOR INPATIENT CLASS**  Functional Assessment Tool Used clinical judgement  Functional Limitations Motor speech  Motor Speech Current Status [id number]) CL  Motor Speech Goal Status [id number]) CK  SLP Evaluations  $ SLP Speech Visit 1 Procedure  SLP Evaluations  $ SLP EVAL LANGUAGE/SOUND PRODUCTION 1 Procedure  Luanna Salk, [facility] SLP [phone]

## 2015-01-07 NOTE — Progress Notes (Signed)
VASCULAR LAB PRELIMINARY  PRELIMINARY  PRELIMINARY  PRELIMINARY  Carotid duplex completed.    Preliminary report:  Right - 1% to 39% ICA stenosis. Vertebral artery flow is antegrade. Left - Occluded ICA. Vertebral artery flow is antegrade.  Anna Livers, RVS 01/07/2015, 3:11 PM

## 2015-01-07 NOTE — Progress Notes (Signed)
STROKE TEAM PROGRESS NOTE   HISTORY Howard Padilla is an 81 y.o. male who was in his usual state of health until sometime yesterday when he was noted by some to have some intermittent difficulty with speech. He did his usual activities and went to bed ut this morning was noted to have urinated in the bed and speech was much worse. His difficulty with speech has been intermittent since that time but mental status is not at baseline. At baseline ambulates intermittently. Dresses himself and feeds himself. Spends most of his day watching television.Date last known well: Date: 01/05/2015.  Patient was not administered TPA secondary to inability to determine last known well. He was admitted for further evaluation and treatment.   SUBJECTIVE (INTERVAL HISTORY) His wife is at the bedside.  She provides care for him at home along with [facility] Wife stated that the slurry speech is better but not baseline yet. He had one time DVT and PE on coumadin for one year. Colon cancer 30 years ago. No recurrence.    OBJECTIVE Temp:  [97.7 F (36.5 C)-98.4 F (36.9 C)] 98 F (36.7 C) (11/10 0500) Pulse Rate:  [65-119] 91 (11/10 0500) Cardiac Rhythm:  [-] Normal sinus rhythm;Bundle branch block (11/10 0859) Resp:  [15-23] 19 (11/10 0500) BP: (113-202)/(77-110) 158/95 mmHg (11/10 0500) SpO2:  [97 %-100 %] 100 % (11/10 0500)  CBC:  Recent Labs Lab 01/06/15 0840 01/06/15 0844 01/07/15 0423  WBC 3.9*  --  3.6*  NEUTROABS 2.4  --   --   HGB 12.4* 13.6 12.3*  HCT 37.4* 40.0 36.9*  MCV 93.0  --  92.0  PLT 171  --  [id number]    Basic Metabolic Panel:  Recent Labs Lab 01/06/15 0840 01/06/15 0844 01/07/15 0423  NA 140 140 136  K 4.6 4.6 4.0  CL 107 106 104  CO2 23  --  23  GLUCOSE 100* 92 89  BUN 19 25* 20  CREATININE 1.32* 1.40* 1.29*  CALCIUM 9.4  --  9.0    Lipid Panel:    Component Value Date/Time   CHOL 213* 01/06/2015 1334   TRIG 67 01/06/2015 1334   HDL 61 01/06/2015 1334   CHOLHDL 3.5  01/06/2015 1334   VLDL 13 01/06/2015 1334   LDLCALC 139* 01/06/2015 1334   HgbA1c:  Lab Results  Component Value Date   HGBA1C 6.0* 01/06/2015   Urine Drug Screen: No results found for: LABOPIA, COCAINSCRNUR, LABBENZ, AMPHETMU, THCU, LABBARB    IMAGING I have personally reviewed the radiological images below and agree with the radiology interpretations.  Ct Head Wo Contrast 01/06/2015   1.  No acute intracranial abnormality. 2.  Cerebral atrophy and small vessel ischemic change.   Mri and Mra Brain Wo Contrast 01/06/2015  1. Motion degraded, incomplete examination as above. 2. Small, acute left parietal MCA territory infarct. 3. Chronic left parietal, left occipital common bilateral cerebellar infarcts. Chronic small vessel ischemic disease in the cerebral white matter. 4. Occluded left internal carotid artery. 5. Patent left M1 segment.  Occlusion of multiple left MCA branches.   Carotid Doppler   Right - 1% to 39% ICA stenosis. Vertebral artery flow is antegrade. Left - Occluded ICA. Vertebral artery flow is antegrade.  TTE - - Left ventricle: The cavity size was normal. There was moderate concentric hypertrophy. Systolic function was normal. The estimated ejection fraction was in the range of 55% to 60%. Although no diagnostic regional wall motion abnormality was identified, this possibility cannot be  completely excluded on the basis of this study. Doppler parameters are consistent with abnormal left ventricular relaxation (grade 1 diastolic dysfunction). Doppler parameters are consistent with elevated mean left atrial filling pressure. - Aortic valve: There was trivial regurgitation. Valve area (VTI): 2.28 cm^2. Valve area (Vmax): 2.25 cm^2. - Mitral valve: Valve area by continuity equation (using LVOT flow): 1.27 cm^2.   PHYSICAL EXAM  Temp:  [98 F (36.7 C)-98.9 F (37.2 C)] 98.7 F (37.1 C) (11/10 1838) Pulse Rate:  [80-98] 80 (11/10 1838) Resp:   [17-20] 20 (11/10 1838) BP: (113-158)/(76-95) 128/80 mmHg (11/10 1838) SpO2:  [97 %-100 %] 100 % (11/10 1838)  General - Well nourished, well developed, in no apparent distress.  Ophthalmologic - Fundi not visualized due to noncooperation.  Cardiovascular - Regular rate and rhythm.  Mental Status -  Awake alert but disorientated to time and age and place, but know his wife and his name  Cranial Nerves II - XII - II - Visual field intact OU. III, IV, VI - Extraocular movements intact. V - Facial sensation intact bilaterally. VII - Facial movement intact bilaterally. VIII - Hearing & vestibular intact bilaterally. X - Palate elevates symmetrically, moderate dysarthria. XI - Chin turning & shoulder shrug intact bilaterally. XII - Tongue protrusion intact.  Motor Strength - The patient's strength was 4+/5 in all extremities and pronator drift was absent.  Bulk was normal and fasciculations were absent.   Motor Tone - Muscle tone was assessed at the neck and appendages and was normal.  Reflexes - The patient's reflexes were 1+ in all extremities and he had no pathological reflexes.  Sensory - Light touch, temperature/pinprick were assessed and were symmetrical.    Coordination - The patient had normal movements in the hands with no ataxia or dysmetria.  Tremor was absent.  Gait and Station - not tested.   ASSESSMENT/PLAN [name] is a 81 y.o. male with history of HTN, HLD, BPH, VTE and colon cancer presenting with difficulty with speech. He did not receive IV t-PA due to unknown last known well.   Stroke:  left parietal MCA infarct thromboembolic secondary to left ICA occlusion  MRI  Small L parietal infarct  MRA  Occluded L ICA, mult L MCA branch occlusions (old)  Carotid Doppler  L ICA occlusion  2D Echo  EF 55-60%  LDL 139  HgbA1c 6.0  Heparin 5000 units sq tid for VTE prophylaxis  Diet Heart Room service appropriate?: Yes; Fluid consistency:: Thin  No  antithrombotic prior to admission, started on aspirin 325 mg daily. Continue at discharge  Ongoing aggressive stroke risk factor management  Therapy recommendations:  HH PT  Disposition:  Anticipate return home with wife (lives with wife, supportive family, [facility] RN, PT and HHA prior to admission)  Hypertension  Stable Permissive hypertension (OK if < 220/120) but gradually normalize in 5-7 days  Hyperlipidemia  Home meds:  No statin   LDL 139, goal < 70  Added low dose statin - pravachol 20  Continue statin at discharge  Other Stroke Risk Factors  Advanced age  Former Cigarette smoker, quit smoking 31 years ago   Hx DVT and pulm emboli - on coumadin for one year  Other Active Problems  BPH  Hospital day # 1  Neurology will sign off. Please call with questions. Pt will follow up with Dr. Erlinda Hong at [facility] in about 2 months. Thanks for the consult.  Rosalin Hawking, MD PhD Stroke Neurology 01/07/2015 9:28 PM  To contact Stroke Continuity provider, please refer to [email or web address] After hours, contact General Neurology

## 2015-01-07 NOTE — Progress Notes (Signed)
  Echocardiogram 2D Echocardiogram has been performed.  Howard Padilla 01/07/2015, 11:55 AM

## 2015-01-07 NOTE — Progress Notes (Signed)
[facility] PROGRESS NOTE  Howard Padilla [id number] DOB: February 14, 1916 DOA: 01/06/2015 PCP: Nyoka Cowden, MD  Assessment/Plan: 1. Acute CVA. -Howard Padilla is a pleasant 81 year old gentleman presenting with complaints of dysarthria with imaging studies showing an acute left parietal MCA territory infarct. -Neurologic symptoms improved on my exam. -Case was discussed with Dr. Erlinda Hong of neurology, patient was started on antiplatelet therapy with aspirin 325 mg by mouth daily. -Transthoracic echocardiogram revealed an ejection fraction of 55-60% with grade 1 diastolic dysfunction. -Carotid Dopplers showed 1-39 percent ICA stenosis, left occluded ICA. -Physical therapy/occupational therapy consulted.  2.  Hypertension. -Patient's antihypertensive agents were held to allow for permissive hypertension. -Systolic blood pressures stable  3.  Dyslipidemia. -Lab showing LDL of 139, question mortality benefit with initiating statin therapy in this 81 year old.    Code Status: Full code Family Communication: I spoke to his wife was present at bedside Disposition Plan: Despite discharge home when medically stable   Consultants:  Neurology  HPI/Subjective: Howard. Padilla is a pleasant 81 year old gentleman who currently resides in the community with his wife presented to the emergency department with complaints of dysarthria. Initial workup included a CT scan of brain which was negative for acute intracranial abnormality. He had an MRI of the brain that revealed an acute left parietal MCA territory infarct. Prior to this he had not been on antiplatelet therapy. He was seen and evaluated by neurology during this hospitalization where he was started on aspirin.  Objective: Filed Vitals:   01/07/15 0500  BP: 158/95  Pulse: 91  Temp: 98 F (36.7 C)  Resp: 19   No intake or output data in the 24 hours ending 01/07/15 1733 Filed Weights   01/06/15 0815  Weight: 61.236 kg (135 lb)     Exam:   General:  Patient is awake and alert oriented 3, he is sitting up having his lunch  Cardiovascular: Regular rate and rhythm normal S1-S2 no murmurs rubs or gallops  Respiratory: Normal respiratory effort, lungs are clear to auscultation bilaterally  Abdomen: Soft nontender nondistended  Musculoskeletal: No edema  Neurological: He had a nonfocal neurologic examination for me, did not appear to have slurred speech or facial droop.  Data Reviewed: Basic Metabolic Panel:  Recent Labs Lab 01/06/15 0840 01/06/15 0844 01/07/15 0423  NA 140 140 136  K 4.6 4.6 4.0  CL 107 106 104  CO2 23  --  23  GLUCOSE 100* 92 89  BUN 19 25* 20  CREATININE 1.32* 1.40* 1.29*  CALCIUM 9.4  --  9.0   Liver Function Tests:  Recent Labs Lab 01/06/15 0840  AST 20  ALT 14*  ALKPHOS 101  BILITOT 0.7  PROT 7.2  ALBUMIN 3.6   No results for input(s): LIPASE, AMYLASE in the last 168 hours. No results for input(s): AMMONIA in the last 168 hours. CBC:  Recent Labs Lab 01/06/15 0840 01/06/15 0844 01/07/15 0423  WBC 3.9*  --  3.6*  NEUTROABS 2.4  --   --   HGB 12.4* 13.6 12.3*  HCT 37.4* 40.0 36.9*  MCV 93.0  --  92.0  PLT 171  --  172   Cardiac Enzymes: No results for input(s): CKTOTAL, CKMB, CKMBINDEX, TROPONINI in the last 168 hours. BNP (last 3 results) No results for input(s): BNP in the last 8760 hours.  ProBNP (last 3 results) No results for input(s): PROBNP in the last 8760 hours.  CBG: No results for input(s): GLUCAP in the last 168 hours.  No  results found for this or any previous visit (from the past 240 hour(s)).   Studies: Ct Head Wo Contrast  01/06/2015  CLINICAL DATA:  Slurred speech and generalize weakness. EXAM: CT HEAD WITHOUT CONTRAST TECHNIQUE: Contiguous axial images were obtained from the base of the skull through the vertex without intravenous contrast. COMPARISON:  12/09/2014 FINDINGS: Sinuses/Soft tissues: Clear paranasal sinuses and mastoid  air cells. Intracranial: Normal cerebral volume for age. Moderate-to-marked low density in the periventricular white matter likely related to small vessel disease. 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 Chronic. No mass lesion, hemorrhage, hydrocephalus, acute infarct, intra-axial, or extra-axial fluid collection. IMPRESSION: 1.  No acute intracranial abnormality. 2.  Cerebral atrophy and small vessel ischemic change. Electronically Signed   By: Abigail Miyamoto M.D.   On: 01/06/2015 10:58   Howard Padilla Head Wo Contrast  01/06/2015  CLINICAL DATA:  Intermittent slurred speech for several days and difficulty following commands. EXAM: MRI HEAD WITHOUT CONTRAST MRA HEAD WITHOUT CONTRAST TECHNIQUE: Multiplanar, multiecho pulse sequences of the brain and surrounding structures were obtained without intravenous contrast. Angiographic images of the head were obtained using MRA technique without contrast. COMPARISON:  Head CT 01/06/2015 FINDINGS: MRI HEAD FINDINGS Due to patient's altered mental status and inability to follow commands, a complete brain MRI was unable to be obtained. Axial and coronal diffusion and sagittal T1 weighted images were obtained. Axial T2* gradient echo images were also obtained but are severely motion degraded. Remaining routine sequences were not performed. There is a small acute posterior left MCA territory cortical infarct in the left parietal lobe. No midline shift or sizable extra-axial fluid collection is seen. Generalized cerebral atrophy is noted. There is a chronic left parietal MCA infarct superior and posterior to the acute infarct, likely with some associated chronic blood products. A chronic left occipital infarct is also noted in addition to chronic small vessel ischemic disease in the cerebral white matter bilaterally. There are chronic bilateral cerebellar infarcts. Prior bilateral cataract extraction is noted. MRA HEAD  FINDINGS The examination is mildly motion degraded. The visualized vertebral arteries are patent and codominant with at most mild narrowing distally. PICA and SCA origins are patent. Basilar artery is patent without evidence of significant stenosis. Posterior communicating arteries are not identified. PCAs are patent with mild irregularity bilaterally but no significant proximal stenosis. Focally abnormal appearance of the mid right P2 segment may reflect a combination of motion artifact, vessel tortuosity, and possibly underlying stenosis and would be an unusual appearance and location for aneurysm. The right internal carotid artery is patent from skullbase to carotid terminus. Slightly bulbous appearance of the proximal right supraclinoid ICA is felt to reflect motion artifact through the ophthalmic artery origin. No left ICA flow related enhancement is seen in the imaged portion of the upper neck or intracranially, consistent with occlusion. There is reconstituted flow at the carotid terminus, and the left ACA and MCA origins are patent. The left M1 segment is patent, however there is occlusion of multiple left MCA branch vessels including one of the main M2 trunks. Right MCA is patent without evidence of significant stenosis or sizable branch vessel occlusion. There is a patent anterior communicating artery which may be fenestrated or duplicated. The left A2 segment is strongly dominant. IMPRESSION: 1. Motion degraded, incomplete examination as above. 2. Small, acute left parietal MCA territory infarct. 3. Chronic left parietal, left occipital common bilateral cerebellar infarcts. Chronic small vessel ischemic disease in the cerebral  white matter. 4. Occluded left internal carotid artery. 5. Patent left M1 segment.  Occlusion of multiple left MCA branches. Electronically Signed   By: Logan Bores M.D.   On: 01/06/2015 16:19   Howard Brain Wo Contrast  01/06/2015  CLINICAL DATA:  Intermittent slurred speech for  several days and difficulty following commands. EXAM: MRI HEAD WITHOUT CONTRAST MRA HEAD WITHOUT CONTRAST TECHNIQUE: Multiplanar, multiecho pulse sequences of the brain and surrounding structures were obtained without intravenous contrast. Angiographic images of the head were obtained using MRA technique without contrast. COMPARISON:  Head CT 01/06/2015 FINDINGS: MRI HEAD FINDINGS Due to patient's altered mental status and inability to follow commands, a complete brain MRI was unable to be obtained. Axial and coronal diffusion and sagittal T1 weighted images were obtained. Axial T2* gradient echo images were also obtained but are severely motion degraded. Remaining routine sequences were not performed. There is a small acute posterior left MCA territory cortical infarct in the left parietal lobe. No midline shift or sizable extra-axial fluid collection is seen. Generalized cerebral atrophy is noted. There is a chronic left parietal MCA infarct superior and posterior to the acute infarct, likely with some associated chronic blood products. A chronic left occipital infarct is also noted in addition to chronic small vessel ischemic disease in the cerebral white matter bilaterally. There are chronic bilateral cerebellar infarcts. Prior bilateral cataract extraction is noted. MRA HEAD FINDINGS The examination is mildly motion degraded. The visualized vertebral arteries are patent and codominant with at most mild narrowing distally. PICA and SCA origins are patent. Basilar artery is patent without evidence of significant stenosis. Posterior communicating arteries are not identified. PCAs are patent with mild irregularity bilaterally but no significant proximal stenosis. Focally abnormal appearance of the mid right P2 segment may reflect a combination of motion artifact, vessel tortuosity, and possibly underlying stenosis and would be an unusual appearance and location for aneurysm. The right internal carotid artery is  patent from skullbase to carotid terminus. Slightly bulbous appearance of the proximal right supraclinoid ICA is felt to reflect motion artifact through the ophthalmic artery origin. No left ICA flow related enhancement is seen in the imaged portion of the upper neck or intracranially, consistent with occlusion. There is reconstituted flow at the carotid terminus, and the left ACA and MCA origins are patent. The left M1 segment is patent, however there is occlusion of multiple left MCA branch vessels including one of the main M2 trunks. Right MCA is patent without evidence of significant stenosis or sizable branch vessel occlusion. There is a patent anterior communicating artery which may be fenestrated or duplicated. The left A2 segment is strongly dominant. IMPRESSION: 1. Motion degraded, incomplete examination as above. 2. Small, acute left parietal MCA territory infarct. 3. Chronic left parietal, left occipital common bilateral cerebellar infarcts. Chronic small vessel ischemic disease in the cerebral white matter. 4. Occluded left internal carotid artery. 5. Patent left M1 segment.  Occlusion of multiple left MCA branches. Electronically Signed   By: Logan Bores M.D.   On: 01/06/2015 16:19    Scheduled Meds: .  stroke: mapping our early stages of recovery book   Does not apply Once  . aspirin  300 mg Rectal Daily   Or  . aspirin  325 mg Oral Daily  . doxazosin  4 mg Oral QHS  . heparin  5,000 Units Subcutaneous 3 times per day  . pravastatin  20 mg Oral q1800  . sodium chloride  3 mL Intravenous Q12H  Continuous Infusions: . sodium chloride 50 mL/hr at 01/06/15 2110    Active Problems:   HLD (hyperlipidemia)   Essential hypertension   BPH (benign prostatic hyperplasia)   Aphasia   Stroke [facility])    Time spent: 30 min    Kelvin Cellar  [facility] Pager [phone]. If 7PM-7AM, please contact night-coverage at [email or web address], password [facility] 01/07/2015, 5:33 PM  LOS: 0 days

## 2015-01-08 LAB — BASIC METABOLIC PANEL
Anion gap: 9 (ref 5–15)
BUN: 22 mg/dL — AB (ref 6–20)
CHLORIDE: 108 mmol/L (ref 101–111)
CO2: 24 mmol/L (ref 22–32)
Calcium: 8.9 mg/dL (ref 8.9–10.3)
Creatinine, Ser: 1.42 mg/dL — ABNORMAL HIGH (ref 0.61–1.24)
GFR calc Af Amer: 46 mL/min — ABNORMAL LOW (ref >60)
GFR calc non Af Amer: 40 mL/min — ABNORMAL LOW (ref >60)
Glucose, Bld: 96 mg/dL (ref 65–99)
POTASSIUM: 4 mmol/L (ref 3.5–5.1)
SODIUM: 141 mmol/L (ref 135–145)

## 2015-01-08 LAB — CBC
HEMATOCRIT: 36.9 % — AB (ref 39.0–52.0)
Hemoglobin: 12.2 g/dL — ABNORMAL LOW (ref 13.0–17.0)
MCH: 30.7 pg (ref 26.0–34.0)
MCHC: 33.1 g/dL (ref 30.0–36.0)
MCV: 92.7 fL (ref 78.0–100.0)
PLATELETS: 185 10*3/uL (ref 150–400)
RBC: 3.98 MIL/uL — AB (ref 4.22–5.81)
RDW: 15 % (ref 11.5–15.5)
WBC: 4.3 10*3/uL (ref 4.0–10.5)

## 2015-01-08 MED ORDER — ASPIRIN 325 MG PO TABS: 325.0000 mg | ORAL_TABLET | Freq: Every day | ORAL | Status: DC

## 2015-01-08 NOTE — Care Management Note (Signed)
Case Management Note  Patient Details  Name: Shyam Heglund MRN: [id number] Date of Birth: 13-Apr-1915  Subjective/Objective:                    Action/Plan: Patient was set up with [facility] in the ED prior to admission to the floor.  CM spoke with Miranda with [facility] to notify of new orders and expected discharge today.  Expected Discharge Date:                  Expected Discharge Plan:  [place]  In-House Referral:  NA  Discharge planning Services  CM Consult  Post Acute Care Choice:  Home Health Choice offered to:  Patient  DME Arranged:  N/A DME Agency:  [place] Arranged:  RN, Disease Management, PT, Nurse's Aide [place] Agency:  [place]  Status of Service:  Completed, signed off  Medicare Important Message Given:    Date Medicare IM Given:    Medicare IM give by:    Date Additional Medicare IM Given:    Additional Medicare Important Message give by:     If discussed at [place] of Stay Meetings, dates discussed:    Additional Comments:  Rolm Baptise, RN 01/08/2015, 10:27 AM

## 2015-01-08 NOTE — Progress Notes (Signed)
Discharge orders received, Pt for discharge home today with home health. IV d/c'd. D/c instructions and RX given with verbalized understanding. Family at bedside to assist patient with discharge. Staff bought pt downstairs via wheelchair. 01/08/15 1632

## 2015-01-08 NOTE — Progress Notes (Signed)
Met with patient and wife to discuss DME needs. Patient's wife states that his walker (purchased one year ago) was damaged beyond repair. They are aware that Medicare will not replace a walker at this time. They have chosen to obtain their own walker independently after discharge. Bedside RN aware.

## 2015-01-08 NOTE — Progress Notes (Signed)
OT Cancellation Note and Discharge  Patient Details Name: Howard Padilla MRN: [id number] DOB: May 17, 1915   Cancelled Treatment:    Reason Eval/Treat Not Completed: Other (comment). Pt to be D/C'd today. Spoke with pt and wife briefly and wife states she feel she can manage pt at home from a basic ADL standpoint. She does feel that pt would benefit from a seat to sit on in the tub after I brought it up and she will have son who works for a [employer] about such a seat although she also states she thinks they have one but not sure it fits tub. Per MD D/C note, pt is to get HHPT--if they feel he would benefit from [facility] they can make the referral. Acute OT will sign off due to pt being D/C'd today.  Almon Register [id number] 01/08/2015, 3:51 PM

## 2015-01-08 NOTE — Discharge Summary (Signed)
Physician Discharge Summary  Nizar Friedrich [id number] DOB: Sep 22, 1915 DOA: 01/06/2015  PCP: Nyoka Cowden, MD  Admit date: 01/06/2015 Discharge date: 01/08/2015  Time spent: 35 minutes  Recommendations for Outpatient Follow-up:  1. Please follow up on blood pressures 2. Patient was set up with home health services for home PT and RN   Discharge Diagnoses:  Active Problems:   HLD (hyperlipidemia)   Essential hypertension   BPH (benign prostatic hyperplasia)   Aphasia   Stroke [facility])   Discharge Condition: Stable  Diet recommendation: Heart Healthy  Filed Weights   01/06/15 0815  Weight: 61.236 kg (135 lb)    History of present illness:  History provided by patient, [facility] physician, wife, daughter and granddaughter. Patient lives at home with family. Patient typically able to perform ADLs up until onset of symptoms. Patient is typically alert and oriented 3. For the last several days patient has had intermittent episodes of slurred speech. He was evaluated in the ED for this with a negative workup and patient requesting to go home at that time. Most recent episode began this morning around 8 AM. Nothing makes the symptoms better. Nothing to his symptoms worse. Patient was last seen normal prior to bed last night. Denies chest pain, short of breath, palpitations, fevers, nausea, vomiting, loss of bowel or bladder function, fall, head trauma, cough, shortness of breath, dysuria.  Hospital Course:  Mr. Mourning is a pleasant 81 year old gentleman who currently resides in the community with his wife presented to the emergency department with complaints of dysarthria. Initial workup included a CT scan of brain which was negative for acute intracranial abnormality. He had an MRI of the brain that revealed an acute left parietal MCA territory infarct. Prior to this he had not been on antiplatelet therapy. He was seen and evaluated by neurology during this hospitalization where he  was started on aspirin.   Acute CVA. -Mr. Doda is a pleasant 81 year old gentleman presenting with complaints of dysarthria with imaging studies showing an acute left parietal MCA territory infarct. -Neurologic symptoms improved on my exam. -Case was discussed with Dr. Erlinda Hong of neurology, patient was started on antiplatelet therapy with aspirin 325 mg by mouth daily. -Transthoracic echocardiogram revealed an ejection fraction of 55-60% with grade 1 diastolic dysfunction. -Carotid Dopplers showed 1-39 percent ICA stenosis, left occluded ICA. -He was discharged home with home health services PT and RN  2. Hypertension. -Patient's antihypertensive agents were held to allow for permissive hypertension. -He was put back on his fosinopril on discharge  3. Dyslipidemia. -Lab showing LDL of 139, I don't believe he would gain therapeutic benefit from the initiation of a statin, risks likely to outweigh benefits.    Consultations:  Neurology  Discharge Exam: Filed Vitals:   01/08/15 1322  BP: 184/92  Pulse: 90  Temp: 98.6 F (37 C)  Resp: 20     General: Patient is awake and alert oriented 3, he ambulated down the hallway with me  Cardiovascular: Regular rate and rhythm normal S1-S2 no murmurs rubs or gallops  Respiratory: Normal respiratory effort, lungs are clear to auscultation bilaterally  Abdomen: Soft nontender nondistended  Musculoskeletal: No edema  Neurological: Dysarthria resolved. Has mild confusion. Nonfocal neurologic exam.   Discharge Instructions   Discharge Instructions    Call MD for:  difficulty breathing, headache or visual disturbances    Complete by:  As directed      Call MD for:  extreme fatigue    Complete by:  As directed  Call MD for:  hives    Complete by:  As directed      Call MD for:  persistant dizziness or light-headedness    Complete by:  As directed      Call MD for:  persistant nausea and vomiting    Complete by:  As directed       Call MD for:  redness, tenderness, or signs of infection (pain, swelling, redness, odor or green/yellow discharge around incision site)    Complete by:  As directed      Call MD for:  severe uncontrolled pain    Complete by:  As directed      Call MD for:  temperature >100.4    Complete by:  As directed      Call MD for:    Complete by:  As directed      Diet - low sodium heart healthy    Complete by:  As directed      Increase activity slowly    Complete by:  As directed           Current Discharge Medication List    START taking these medications   Details  aspirin 325 MG tablet Take 1 tablet (325 mg total) by mouth daily. Qty: 30 tablet, Refills: 1      CONTINUE these medications which have NOT CHANGED   Details  doxazosin (CARDURA) 4 MG tablet TAKE ONE TABLET BY MOUTH AT BEDTIME Qty: 90 tablet, Refills: 0    fosinopril (MONOPRIL) 20 MG tablet TAKE ONE TABLET BY MOUTH ONCE DAILY Qty: 90 tablet, Refills: 0    Multiple Vitamin (MULTIVITAMIN WITH MINERALS) TABS tablet Take 1 tablet by mouth daily.    acetaminophen (TYLENOL) 500 MG tablet Take 2 tablets (1,000 mg total) by mouth every 6 (six) hours as needed. Qty: 30 tablet, Refills: 0    orphenadrine (NORFLEX) 100 MG tablet Take 1 tablet (100 mg total) by mouth 2 (two) times daily. Qty: 30 tablet, Refills: 0       Allergies  Allergen Reactions  . Penicillins Rash    Has patient had a PCN reaction causing immediate rash, facial/tongue/throat swelling, SOB or lightheadedness with hypotension: No Has patient had a PCN reaction causing severe rash involving mucus membranes or skin necrosis: No Has patient had a PCN reaction that required hospitalization No Has patient had a PCN reaction occurring within the last 10 years: No If all of the above answers are "NO", then may proceed with Cephalosporin use.    Follow-up Information    Follow up with [facility].   Contact information:   [address] [place] [address] [phone]       Follow up with Xu,Jindong, MD. Schedule an appointment as soon as possible for a visit in 2 months.   Specialty:  Neurology   Why:  stroke clinic   Contact information:   [address] [place] [address] [phone]       Follow up with Nyoka Cowden, MD In 1 week.   Specialty:  Internal Medicine   Contact information:   [place] [address] [phone]        The results of significant diagnostics from this hospitalization (including imaging, microbiology, ancillary and laboratory) are listed below for reference.    Significant Diagnostic Studies: Ct Head Wo Contrast  01/06/2015  CLINICAL DATA:  Slurred speech and generalize weakness. EXAM: CT HEAD WITHOUT CONTRAST TECHNIQUE: Contiguous axial  images were obtained from the base of the skull through the vertex without intravenous contrast. COMPARISON:  12/09/2014 FINDINGS: Sinuses/Soft tissues: Clear paranasal sinuses and mastoid air cells. Intracranial: Normal cerebral volume for age. Moderate-to-marked low density in the periventricular white matter likely related to small vessel disease. 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 Chronic. No mass lesion, hemorrhage, hydrocephalus, acute infarct, intra-axial, or extra-axial fluid collection. IMPRESSION: 1.  No acute intracranial abnormality. 2.  Cerebral atrophy and small vessel ischemic change. Electronically Signed   By: Abigail Miyamoto M.D.   On: 01/06/2015 10:58   Ct Head Wo Contrast  12/09/2014  CLINICAL DATA:  Patient states that he started having frequent urination overnight. He reports he was having to go the bathroom at least every 30 minutes. He denies that there is any pain associated. There has not been any fever. No nausea EXAM: CT HEAD WITHOUT CONTRAST TECHNIQUE: Contiguous axial images were obtained from the  base of the skull through the vertex without intravenous contrast. COMPARISON:  06/15/2006 FINDINGS: Atherosclerotic and physiologic intracranial calcifications. Diffuse parenchymal atrophy. Progressive moderate patchy areas of hypoattenuation in deep and periventricular white matter bilaterally. Negative for acute intracranial hemorrhage, mass lesion, acute infarction, midline shift, or mass-effect. Acute infarct may be inapparent on noncontrast CT. Ventricles and sulci symmetric. Bone windows demonstrate no focal lesion. IMPRESSION: 1. Negative for bleed or other acute intracranial process. 2. Atrophy and nonspecific white matter changes. Electronically Signed   By: Lucrezia Europe M.D.   On: 12/09/2014 16:30   Mr Angiogram Head Wo Contrast  01/06/2015  CLINICAL DATA:  Intermittent slurred speech for several days and difficulty following commands. EXAM: MRI HEAD WITHOUT CONTRAST MRA HEAD WITHOUT CONTRAST TECHNIQUE: Multiplanar, multiecho pulse sequences of the brain and surrounding structures were obtained without intravenous contrast. Angiographic images of the head were obtained using MRA technique without contrast. COMPARISON:  Head CT 01/06/2015 FINDINGS: MRI HEAD FINDINGS Due to patient's altered mental status and inability to follow commands, a complete brain MRI was unable to be obtained. Axial and coronal diffusion and sagittal T1 weighted images were obtained. Axial T2* gradient echo images were also obtained but are severely motion degraded. Remaining routine sequences were not performed. There is a small acute posterior left MCA territory cortical infarct in the left parietal lobe. No midline shift or sizable extra-axial fluid collection is seen. Generalized cerebral atrophy is noted. There is a chronic left parietal MCA infarct superior and posterior to the acute infarct, likely with some associated chronic blood products. A chronic left occipital infarct is also noted in addition to chronic small vessel  ischemic disease in the cerebral white matter bilaterally. There are chronic bilateral cerebellar infarcts. Prior bilateral cataract extraction is noted. MRA HEAD FINDINGS The examination is mildly motion degraded. The visualized vertebral arteries are patent and codominant with at most mild narrowing distally. PICA and SCA origins are patent. Basilar artery is patent without evidence of significant stenosis. Posterior communicating arteries are not identified. PCAs are patent with mild irregularity bilaterally but no significant proximal stenosis. Focally abnormal appearance of the mid right P2 segment may reflect a combination of motion artifact, vessel tortuosity, and possibly underlying stenosis and would be an unusual appearance and location for aneurysm. The right internal carotid artery is patent from skullbase to carotid terminus. Slightly bulbous appearance of the proximal right supraclinoid ICA is felt to reflect motion artifact through the ophthalmic artery origin. No left ICA flow related enhancement is  seen in the imaged portion of the upper neck or intracranially, consistent with occlusion. There is reconstituted flow at the carotid terminus, and the left ACA and MCA origins are patent. The left M1 segment is patent, however there is occlusion of multiple left MCA branch vessels including one of the main M2 trunks. Right MCA is patent without evidence of significant stenosis or sizable branch vessel occlusion. There is a patent anterior communicating artery which may be fenestrated or duplicated. The left A2 segment is strongly dominant. IMPRESSION: 1. Motion degraded, incomplete examination as above. 2. Small, acute left parietal MCA territory infarct. 3. Chronic left parietal, left occipital common bilateral cerebellar infarcts. Chronic small vessel ischemic disease in the cerebral white matter. 4. Occluded left internal carotid artery. 5. Patent left M1 segment.  Occlusion of multiple left MCA  branches. Electronically Signed   By: Logan Bores M.D.   On: 01/06/2015 16:19   Mr Brain Wo Contrast  01/06/2015  CLINICAL DATA:  Intermittent slurred speech for several days and difficulty following commands. EXAM: MRI HEAD WITHOUT CONTRAST MRA HEAD WITHOUT CONTRAST TECHNIQUE: Multiplanar, multiecho pulse sequences of the brain and surrounding structures were obtained without intravenous contrast. Angiographic images of the head were obtained using MRA technique without contrast. COMPARISON:  Head CT 01/06/2015 FINDINGS: MRI HEAD FINDINGS Due to patient's altered mental status and inability to follow commands, a complete brain MRI was unable to be obtained. Axial and coronal diffusion and sagittal T1 weighted images were obtained. Axial T2* gradient echo images were also obtained but are severely motion degraded. Remaining routine sequences were not performed. There is a small acute posterior left MCA territory cortical infarct in the left parietal lobe. No midline shift or sizable extra-axial fluid collection is seen. Generalized cerebral atrophy is noted. There is a chronic left parietal MCA infarct superior and posterior to the acute infarct, likely with some associated chronic blood products. A chronic left occipital infarct is also noted in addition to chronic small vessel ischemic disease in the cerebral white matter bilaterally. There are chronic bilateral cerebellar infarcts. Prior bilateral cataract extraction is noted. MRA HEAD FINDINGS The examination is mildly motion degraded. The visualized vertebral arteries are patent and codominant with at most mild narrowing distally. PICA and SCA origins are patent. Basilar artery is patent without evidence of significant stenosis. Posterior communicating arteries are not identified. PCAs are patent with mild irregularity bilaterally but no significant proximal stenosis. Focally abnormal appearance of the mid right P2 segment may reflect a combination of  motion artifact, vessel tortuosity, and possibly underlying stenosis and would be an unusual appearance and location for aneurysm. The right internal carotid artery is patent from skullbase to carotid terminus. Slightly bulbous appearance of the proximal right supraclinoid ICA is felt to reflect motion artifact through the ophthalmic artery origin. No left ICA flow related enhancement is seen in the imaged portion of the upper neck or intracranially, consistent with occlusion. There is reconstituted flow at the carotid terminus, and the left ACA and MCA origins are patent. The left M1 segment is patent, however there is occlusion of multiple left MCA branch vessels including one of the main M2 trunks. Right MCA is patent without evidence of significant stenosis or sizable branch vessel occlusion. There is a patent anterior communicating artery which may be fenestrated or duplicated. The left A2 segment is strongly dominant. IMPRESSION: 1. Motion degraded, incomplete examination as above. 2. Small, acute left parietal MCA territory infarct. 3. Chronic left parietal, left occipital  common bilateral cerebellar infarcts. Chronic small vessel ischemic disease in the cerebral white matter. 4. Occluded left internal carotid artery. 5. Patent left M1 segment.  Occlusion of multiple left MCA branches. Electronically Signed   By: Logan Bores M.D.   On: 01/06/2015 16:19   Dg Abd Acute W/chest  12/09/2014  CLINICAL DATA:  Cough, nausea, vomiting. EXAM: DG ABDOMEN ACUTE W/ 1V CHEST COMPARISON:  07/18/2013 FINDINGS: Resolution of previously seen cavitary process in the left upper lung. Mild hyperinflation compatible with COPD. Heart is normal size. No confluent airspace opacities or effusions. Moderate stool burden throughout the colon. Nonobstructive bowel gas pattern. No free air organomegaly. No suspicious calcification. No acute bony abnormality. IMPRESSION: No evidence of bowel obstruction or free air. Moderate stool  burden. Mild hyperinflation/COPD.  No active cardiopulmonary disease. Electronically Signed   By: Rolm Baptise M.D.   On: 12/09/2014 16:18    Microbiology: No results found for this or any previous visit (from the past 240 hour(s)).   Labs: Basic Metabolic Panel:  Recent Labs Lab 01/06/15 0840 01/06/15 0844 01/07/15 0423 01/08/15 0554  NA 140 140 136 141  K 4.6 4.6 4.0 4.0  CL 107 106 104 108  CO2 23  --  23 24  GLUCOSE 100* 92 89 96  BUN 19 25* 20 22*  CREATININE 1.32* 1.40* 1.29* 1.42*  CALCIUM 9.4  --  9.0 8.9   Liver Function Tests:  Recent Labs Lab 01/06/15 0840  AST 20  ALT 14*  ALKPHOS 101  BILITOT 0.7  PROT 7.2  ALBUMIN 3.6   No results for input(s): LIPASE, AMYLASE in the last 168 hours. No results for input(s): AMMONIA in the last 168 hours. CBC:  Recent Labs Lab 01/06/15 0840 01/06/15 0844 01/07/15 0423 01/08/15 0554  WBC 3.9*  --  3.6* 4.3  NEUTROABS 2.4  --   --   --   HGB 12.4* 13.6 12.3* 12.2*  HCT 37.4* 40.0 36.9* 36.9*  MCV 93.0  --  92.0 92.7  PLT 171  --  172 185   Cardiac Enzymes: No results for input(s): CKTOTAL, CKMB, CKMBINDEX, TROPONINI in the last 168 hours. BNP: BNP (last 3 results) No results for input(s): BNP in the last 8760 hours.  ProBNP (last 3 results) No results for input(s): PROBNP in the last 8760 hours.  CBG: No results for input(s): GLUCAP in the last 168 hours.     SignedKelvin Cellar  [facility] 01/08/2015, 2:49 PM

## 2015-01-11 ENCOUNTER — Telehealth: Payer: Self-pay

## 2015-01-11 DIAGNOSIS — I69328 Other speech and language deficits following cerebral infarction: Secondary | ICD-10-CM | POA: Diagnosis not present

## 2015-01-11 DIAGNOSIS — E785 Hyperlipidemia, unspecified: Secondary | ICD-10-CM | POA: Diagnosis not present

## 2015-01-11 DIAGNOSIS — Z86718 Personal history of other venous thrombosis and embolism: Secondary | ICD-10-CM | POA: Diagnosis not present

## 2015-01-11 DIAGNOSIS — Z7982 Long term (current) use of aspirin: Secondary | ICD-10-CM | POA: Diagnosis not present

## 2015-01-11 DIAGNOSIS — Z9181 History of falling: Secondary | ICD-10-CM | POA: Diagnosis not present

## 2015-01-11 DIAGNOSIS — N401 Enlarged prostate with lower urinary tract symptoms: Secondary | ICD-10-CM | POA: Diagnosis not present

## 2015-01-11 DIAGNOSIS — I69398 Other sequelae of cerebral infarction: Secondary | ICD-10-CM | POA: Diagnosis not present

## 2015-01-11 DIAGNOSIS — N39498 Other specified urinary incontinence: Secondary | ICD-10-CM | POA: Diagnosis not present

## 2015-01-11 DIAGNOSIS — I69318 Other symptoms and signs involving cognitive functions following cerebral infarction: Secondary | ICD-10-CM | POA: Diagnosis not present

## 2015-01-11 DIAGNOSIS — Z85038 Personal history of other malignant neoplasm of large intestine: Secondary | ICD-10-CM | POA: Diagnosis not present

## 2015-01-11 DIAGNOSIS — Z87891 Personal history of nicotine dependence: Secondary | ICD-10-CM | POA: Diagnosis not present

## 2015-01-11 DIAGNOSIS — Z86711 Personal history of pulmonary embolism: Secondary | ICD-10-CM | POA: Diagnosis not present

## 2015-01-11 DIAGNOSIS — I69320 Aphasia following cerebral infarction: Secondary | ICD-10-CM | POA: Diagnosis not present

## 2015-01-11 DIAGNOSIS — R262 Difficulty in walking, not elsewhere classified: Secondary | ICD-10-CM | POA: Diagnosis not present

## 2015-01-11 DIAGNOSIS — I119 Hypertensive heart disease without heart failure: Secondary | ICD-10-CM | POA: Diagnosis not present

## 2015-01-11 DIAGNOSIS — M6281 Muscle weakness (generalized): Secondary | ICD-10-CM | POA: Diagnosis not present

## 2015-01-11 NOTE — Telephone Encounter (Signed)
Spoke to pt wife and she states that pt doesn't want to see any other doctor but Dr [name]

## 2015-01-13 DIAGNOSIS — I69398 Other sequelae of cerebral infarction: Secondary | ICD-10-CM | POA: Diagnosis not present

## 2015-01-13 DIAGNOSIS — I69320 Aphasia following cerebral infarction: Secondary | ICD-10-CM | POA: Diagnosis not present

## 2015-01-13 DIAGNOSIS — M6281 Muscle weakness (generalized): Secondary | ICD-10-CM | POA: Diagnosis not present

## 2015-01-13 DIAGNOSIS — R262 Difficulty in walking, not elsewhere classified: Secondary | ICD-10-CM | POA: Diagnosis not present

## 2015-01-13 DIAGNOSIS — I69318 Other symptoms and signs involving cognitive functions following cerebral infarction: Secondary | ICD-10-CM | POA: Diagnosis not present

## 2015-01-13 DIAGNOSIS — I69328 Other speech and language deficits following cerebral infarction: Secondary | ICD-10-CM | POA: Diagnosis not present

## 2015-01-15 ENCOUNTER — Telehealth: Payer: Self-pay | Admitting: *Deleted

## 2015-01-15 DIAGNOSIS — I69398 Other sequelae of cerebral infarction: Secondary | ICD-10-CM | POA: Diagnosis not present

## 2015-01-15 DIAGNOSIS — R262 Difficulty in walking, not elsewhere classified: Secondary | ICD-10-CM | POA: Diagnosis not present

## 2015-01-15 DIAGNOSIS — I69318 Other symptoms and signs involving cognitive functions following cerebral infarction: Secondary | ICD-10-CM | POA: Diagnosis not present

## 2015-01-15 DIAGNOSIS — M6281 Muscle weakness (generalized): Secondary | ICD-10-CM | POA: Diagnosis not present

## 2015-01-15 DIAGNOSIS — I69320 Aphasia following cerebral infarction: Secondary | ICD-10-CM | POA: Diagnosis not present

## 2015-01-15 DIAGNOSIS — I69328 Other speech and language deficits following cerebral infarction: Secondary | ICD-10-CM | POA: Diagnosis not present

## 2015-01-15 NOTE — Telephone Encounter (Signed)
Received phone call from Physical Therapist Josph Macho asking for orders for PT and Speech Evaluation for pt. Told him that is fine per Dr.K. Shanon Brow also said pt's wife is getting a little hostile with everyone coming to the house and she does not want Drake to come anymore. Told him okay.

## 2015-01-18 DIAGNOSIS — I69328 Other speech and language deficits following cerebral infarction: Secondary | ICD-10-CM | POA: Diagnosis not present

## 2015-01-18 DIAGNOSIS — I69318 Other symptoms and signs involving cognitive functions following cerebral infarction: Secondary | ICD-10-CM | POA: Diagnosis not present

## 2015-01-18 DIAGNOSIS — I69398 Other sequelae of cerebral infarction: Secondary | ICD-10-CM | POA: Diagnosis not present

## 2015-01-18 DIAGNOSIS — M6281 Muscle weakness (generalized): Secondary | ICD-10-CM | POA: Diagnosis not present

## 2015-01-18 DIAGNOSIS — R262 Difficulty in walking, not elsewhere classified: Secondary | ICD-10-CM | POA: Diagnosis not present

## 2015-01-18 DIAGNOSIS — I69320 Aphasia following cerebral infarction: Secondary | ICD-10-CM | POA: Diagnosis not present

## 2015-01-20 DIAGNOSIS — I69398 Other sequelae of cerebral infarction: Secondary | ICD-10-CM | POA: Diagnosis not present

## 2015-01-20 DIAGNOSIS — R262 Difficulty in walking, not elsewhere classified: Secondary | ICD-10-CM | POA: Diagnosis not present

## 2015-01-20 DIAGNOSIS — I69328 Other speech and language deficits following cerebral infarction: Secondary | ICD-10-CM | POA: Diagnosis not present

## 2015-01-20 DIAGNOSIS — I69318 Other symptoms and signs involving cognitive functions following cerebral infarction: Secondary | ICD-10-CM | POA: Diagnosis not present

## 2015-01-20 DIAGNOSIS — M6281 Muscle weakness (generalized): Secondary | ICD-10-CM | POA: Diagnosis not present

## 2015-01-20 DIAGNOSIS — I69320 Aphasia following cerebral infarction: Secondary | ICD-10-CM | POA: Diagnosis not present

## 2015-01-28 DIAGNOSIS — R262 Difficulty in walking, not elsewhere classified: Secondary | ICD-10-CM | POA: Diagnosis not present

## 2015-01-28 DIAGNOSIS — I69398 Other sequelae of cerebral infarction: Secondary | ICD-10-CM | POA: Diagnosis not present

## 2015-01-28 DIAGNOSIS — M6281 Muscle weakness (generalized): Secondary | ICD-10-CM | POA: Diagnosis not present

## 2015-01-28 DIAGNOSIS — I69318 Other symptoms and signs involving cognitive functions following cerebral infarction: Secondary | ICD-10-CM | POA: Diagnosis not present

## 2015-01-28 DIAGNOSIS — I69320 Aphasia following cerebral infarction: Secondary | ICD-10-CM | POA: Diagnosis not present

## 2015-01-28 DIAGNOSIS — I69328 Other speech and language deficits following cerebral infarction: Secondary | ICD-10-CM | POA: Diagnosis not present

## 2015-01-29 DIAGNOSIS — M6281 Muscle weakness (generalized): Secondary | ICD-10-CM | POA: Diagnosis not present

## 2015-01-29 DIAGNOSIS — I69318 Other symptoms and signs involving cognitive functions following cerebral infarction: Secondary | ICD-10-CM | POA: Diagnosis not present

## 2015-01-29 DIAGNOSIS — I69320 Aphasia following cerebral infarction: Secondary | ICD-10-CM | POA: Diagnosis not present

## 2015-01-29 DIAGNOSIS — I69328 Other speech and language deficits following cerebral infarction: Secondary | ICD-10-CM | POA: Diagnosis not present

## 2015-01-29 DIAGNOSIS — I69398 Other sequelae of cerebral infarction: Secondary | ICD-10-CM | POA: Diagnosis not present

## 2015-01-29 DIAGNOSIS — R262 Difficulty in walking, not elsewhere classified: Secondary | ICD-10-CM | POA: Diagnosis not present

## 2015-02-02 ENCOUNTER — Other Ambulatory Visit: Payer: Self-pay | Admitting: Internal Medicine

## 2015-02-03 ENCOUNTER — Other Ambulatory Visit: Payer: Self-pay | Admitting: Internal Medicine

## 2015-02-03 DIAGNOSIS — R262 Difficulty in walking, not elsewhere classified: Secondary | ICD-10-CM | POA: Diagnosis not present

## 2015-02-03 DIAGNOSIS — M6281 Muscle weakness (generalized): Secondary | ICD-10-CM | POA: Diagnosis not present

## 2015-02-03 DIAGNOSIS — I69318 Other symptoms and signs involving cognitive functions following cerebral infarction: Secondary | ICD-10-CM | POA: Diagnosis not present

## 2015-02-03 DIAGNOSIS — I69320 Aphasia following cerebral infarction: Secondary | ICD-10-CM | POA: Diagnosis not present

## 2015-02-03 DIAGNOSIS — I69398 Other sequelae of cerebral infarction: Secondary | ICD-10-CM | POA: Diagnosis not present

## 2015-02-03 DIAGNOSIS — I69328 Other speech and language deficits following cerebral infarction: Secondary | ICD-10-CM | POA: Diagnosis not present

## 2015-02-04 DIAGNOSIS — I69398 Other sequelae of cerebral infarction: Secondary | ICD-10-CM | POA: Diagnosis not present

## 2015-02-04 DIAGNOSIS — R262 Difficulty in walking, not elsewhere classified: Secondary | ICD-10-CM | POA: Diagnosis not present

## 2015-02-04 DIAGNOSIS — I69318 Other symptoms and signs involving cognitive functions following cerebral infarction: Secondary | ICD-10-CM | POA: Diagnosis not present

## 2015-02-04 DIAGNOSIS — M6281 Muscle weakness (generalized): Secondary | ICD-10-CM | POA: Diagnosis not present

## 2015-02-08 DIAGNOSIS — I69320 Aphasia following cerebral infarction: Secondary | ICD-10-CM | POA: Diagnosis not present

## 2015-02-08 DIAGNOSIS — M6281 Muscle weakness (generalized): Secondary | ICD-10-CM | POA: Diagnosis not present

## 2015-02-08 DIAGNOSIS — I69398 Other sequelae of cerebral infarction: Secondary | ICD-10-CM | POA: Diagnosis not present

## 2015-02-08 DIAGNOSIS — R262 Difficulty in walking, not elsewhere classified: Secondary | ICD-10-CM | POA: Diagnosis not present

## 2015-02-08 DIAGNOSIS — I69328 Other speech and language deficits following cerebral infarction: Secondary | ICD-10-CM | POA: Diagnosis not present

## 2015-02-08 DIAGNOSIS — I69318 Other symptoms and signs involving cognitive functions following cerebral infarction: Secondary | ICD-10-CM | POA: Diagnosis not present

## 2015-02-10 DIAGNOSIS — I69318 Other symptoms and signs involving cognitive functions following cerebral infarction: Secondary | ICD-10-CM | POA: Diagnosis not present

## 2015-02-10 DIAGNOSIS — I69320 Aphasia following cerebral infarction: Secondary | ICD-10-CM | POA: Diagnosis not present

## 2015-02-10 DIAGNOSIS — I69398 Other sequelae of cerebral infarction: Secondary | ICD-10-CM | POA: Diagnosis not present

## 2015-02-10 DIAGNOSIS — M6281 Muscle weakness (generalized): Secondary | ICD-10-CM | POA: Diagnosis not present

## 2015-02-10 DIAGNOSIS — I69328 Other speech and language deficits following cerebral infarction: Secondary | ICD-10-CM | POA: Diagnosis not present

## 2015-02-10 DIAGNOSIS — R262 Difficulty in walking, not elsewhere classified: Secondary | ICD-10-CM | POA: Diagnosis not present

## 2015-03-08 ENCOUNTER — Other Ambulatory Visit: Payer: Self-pay | Admitting: Internal Medicine

## 2015-03-09 ENCOUNTER — Other Ambulatory Visit: Payer: Self-pay | Admitting: Internal Medicine

## 2015-06-21 ENCOUNTER — Encounter: Payer: Self-pay | Admitting: Internal Medicine

## 2015-06-21 ENCOUNTER — Ambulatory Visit (INDEPENDENT_AMBULATORY_CARE_PROVIDER_SITE_OTHER): Payer: Medicare Other | Admitting: Internal Medicine

## 2015-06-21 VITALS — BP 138/80 | HR 80 | Temp 97.9°F | Resp 20 | Ht 72.0 in | Wt 139.0 lb

## 2015-06-21 DIAGNOSIS — F03918 Unspecified dementia, unspecified severity, with other behavioral disturbance: Secondary | ICD-10-CM

## 2015-06-21 DIAGNOSIS — N183 Chronic kidney disease, stage 3 unspecified: Secondary | ICD-10-CM

## 2015-06-21 DIAGNOSIS — I10 Essential (primary) hypertension: Secondary | ICD-10-CM | POA: Diagnosis not present

## 2015-06-21 DIAGNOSIS — Z85038 Personal history of other malignant neoplasm of large intestine: Secondary | ICD-10-CM | POA: Diagnosis not present

## 2015-06-21 DIAGNOSIS — I6789 Other cerebrovascular disease: Secondary | ICD-10-CM | POA: Diagnosis not present

## 2015-06-21 DIAGNOSIS — F0391 Unspecified dementia with behavioral disturbance: Secondary | ICD-10-CM

## 2015-06-21 NOTE — Progress Notes (Signed)
Subjective:    Patient ID: Howard Padilla, male    DOB: February 14, 1916, 82 y.o.   MRN: [id number]  HPI   Admit date: 01/06/2015 Discharge date: 01/08/2015   Discharge Diagnoses:  Active Problems:  HLD (hyperlipidemia)  Essential hypertension  BPH (benign prostatic hyperplasia)  Aphasia  Stroke [facility])   Filed Weights   01/06/15 0815  Weight: 61.236 kg (135 lb)    Hospital Course:  Howard Padilla is a pleasant 82 year old gentleman who currently resides in the community with his wife presented to the emergency department with complaints of dysarthria. Initial workup included a CT scan of brain which was negative for acute intracranial abnormality. He had an MRI of the brain that revealed an acute left parietal MCA territory infarct. Prior to this he had not been on antiplatelet therapy. He was seen and evaluated by neurology during this hospitalization where he was started on aspirin.   Acute CVA. -Howard Padilla is a pleasant 82 year old gentleman presenting with complaints of dysarthria with imaging studies showing an acute left parietal MCA territory infarct. -Neurologic symptoms improved on my exam. -Case was discussed with Dr. Erlinda Hong of neurology, patient was started on antiplatelet therapy with aspirin 325 mg by mouth daily. -Transthoracic echocardiogram revealed an ejection fraction of 55-60% with grade 1 diastolic dysfunction. -Carotid Dopplers showed 1-39 percent ICA stenosis, left occluded ICA. -He was discharged home with home health services PT and RN     Wt Readings from Last 3 Encounters:  06/21/15 139 lb (63.05 kg)  01/06/15 135 lb (61.236 kg)  12/25/14 135 lb (61.29 kg)    82 year old patient who is seen today for follow-up.  He was minimal hospital for a left parietal stroke in November of last year.   He lives with family and requires assistance with all aspects of daily living.  He has become much more confused.  He requires assistance with bathing,  toileting and ambulation.  He does have a cane and walker at home.  He often uses adult diapers.   He is seen today for form completion and evaluation of possible veterans benefits.  Due to house bound status   since his stroke.  He has had much more confusion, especially through the night  Past Medical History  Diagnosis Date  . Cancer ([place])     colon  . Hyperlipidemia   . Hypertension   . BPH (benign prostatic hypertrophy)   . DVT (deep venous thrombosis) ([place])     with pulmonary embo.     Social History   Social History  . Marital Status: Married    Spouse Name: N/A  . Number of Children: N/A  . Years of Education: N/A   Occupational History  . Not on file.   Social History Main Topics  . Smoking status: Former Smoker -- 2.00 packs/day for 30 years    Types: Cigarettes    Quit date: 02/28/1983  . Smokeless tobacco: Never Used  . Alcohol Use: No  . Drug Use: No  . Sexual Activity: Not on file   Other Topics Concern  . Not on file   Social History Narrative    Past Surgical History  Procedure Laterality Date  . Colon surgery      partial colectomy  . Hernia repair      ingunial    Family History  Problem Relation Age of Onset  . Family history unknown: Yes    Allergies  Allergen Reactions  . Penicillins Rash    Has  patient had a PCN reaction causing immediate rash, facial/tongue/throat swelling, SOB or lightheadedness with hypotension: No Has patient had a PCN reaction causing severe rash involving mucus membranes or skin necrosis: No Has patient had a PCN reaction that required hospitalization No Has patient had a PCN reaction occurring within the last 10 years: No If all of the above answers are "NO", then may proceed with Cephalosporin use.     Current Outpatient Prescriptions on File Prior to Visit  Medication Sig Dispense Refill  . acetaminophen (TYLENOL) 500 MG tablet Take 2 tablets (1,000 mg total) by mouth every 6 (six) hours as needed. 30  tablet 0  . aspirin 325 MG tablet Take 1 tablet (325 mg total) by mouth daily. 30 tablet 1  . doxazosin (CARDURA) 4 MG tablet TAKE ONE TABLET BY MOUTH AT BEDTIME 90 tablet 0  . fosinopril (MONOPRIL) 20 MG tablet TAKE ONE TABLET BY MOUTH ONCE DAILY 90 tablet 0  . Multiple Vitamin (MULTIVITAMIN WITH MINERALS) TABS tablet Take 1 tablet by mouth daily.    . orphenadrine (NORFLEX) 100 MG tablet Take 1 tablet (100 mg total) by mouth 2 (two) times daily. 30 tablet 0   No current facility-administered medications on file prior to visit.    BP 138/80 mmHg  Pulse 80  Temp(Src) 97.9 F (36.6 C) (Oral)  Resp 20  Ht 6' (1.829 m)  Wt 139 lb (63.05 kg)  BMI 18.85 kg/m2  SpO2 98%      Review of Systems  Constitutional: Negative for fever, chills, appetite change and fatigue.  HENT: Negative for congestion, dental problem, ear pain, hearing loss, sore throat, tinnitus, trouble swallowing and voice change.   Eyes: Negative for pain, discharge and visual disturbance.  Respiratory: Negative for cough, chest tightness, wheezing and stridor.   Cardiovascular: Negative for chest pain, palpitations and leg swelling.  Gastrointestinal: Negative for nausea, vomiting, abdominal pain, diarrhea, constipation, blood in stool and abdominal distention.  Genitourinary: Negative for urgency, hematuria, flank pain, discharge, difficulty urinating and genital sores.  Musculoskeletal: Positive for gait problem. Negative for myalgias, back pain, joint swelling, arthralgias and neck stiffness.  Skin: Negative for rash.  Neurological: Negative for dizziness, syncope, speech difficulty, weakness, numbness and headaches.  Hematological: Negative for adenopathy. Does not bruise/bleed easily.  Psychiatric/Behavioral: Positive for behavioral problems and confusion. Negative for dysphoric mood. The patient is not nervous/anxious.        Objective:   Physical Exam  Constitutional: He appears well-developed.  Elderly,  frail, no distress.  Her pressure well controlled Weight 139 and stable  HENT:  Head: Normocephalic.  Right Ear: External ear normal.  Left Ear: External ear normal.  Eyes: Conjunctivae and EOM are normal.  Neck: Normal range of motion.  Cardiovascular: Normal rate and normal heart sounds.   Pulmonary/Chest: Breath sounds normal.  Abdominal: Bowel sounds are normal.  Musculoskeletal: Normal range of motion. He exhibits no edema or tenderness.  Noninflammatory soft nodule involving the left knee area that has been decreasing in size consistent with a hematoma  Neurological: He is alert. No cranial nerve deficit. Coordination normal.  Moderate confusion  Psychiatric: He has a normal mood and affect. His behavior is normal.          Assessment & Plan:

## 2015-06-21 NOTE — Patient Instructions (Signed)
Limit your sodium (Salt) intake   continue present medical regimen   Return in 6 months for follow-up

## 2015-06-21 NOTE — Progress Notes (Signed)
Pre visit review using our clinic review tool, if applicable. No additional management support is needed unless otherwise documented below in the visit note. 

## 2015-08-01 ENCOUNTER — Other Ambulatory Visit: Payer: Self-pay | Admitting: Internal Medicine

## 2015-08-20 ENCOUNTER — Emergency Department (HOSPITAL_COMMUNITY): Payer: Medicare Other

## 2015-08-20 ENCOUNTER — Other Ambulatory Visit: Payer: Self-pay

## 2015-08-20 ENCOUNTER — Encounter (HOSPITAL_COMMUNITY): Payer: Self-pay | Admitting: Emergency Medicine

## 2015-08-20 ENCOUNTER — Emergency Department (HOSPITAL_COMMUNITY)
Admission: EM | Admit: 2015-08-20 | Discharge: 2015-08-20 | Disposition: A | Discharge status: Home / Self Care | Payer: Medicare Other | Attending: Emergency Medicine | Admitting: Emergency Medicine

## 2015-08-20 DIAGNOSIS — R4182 Altered mental status, unspecified: Secondary | ICD-10-CM | POA: Diagnosis present

## 2015-08-20 DIAGNOSIS — R410 Disorientation, unspecified: Secondary | ICD-10-CM | POA: Diagnosis not present

## 2015-08-20 DIAGNOSIS — Z79899 Other long term (current) drug therapy: Secondary | ICD-10-CM | POA: Insufficient documentation

## 2015-08-20 DIAGNOSIS — I10 Essential (primary) hypertension: Secondary | ICD-10-CM | POA: Diagnosis not present

## 2015-08-20 DIAGNOSIS — E785 Hyperlipidemia, unspecified: Secondary | ICD-10-CM | POA: Insufficient documentation

## 2015-08-20 DIAGNOSIS — Z86718 Personal history of other venous thrombosis and embolism: Secondary | ICD-10-CM | POA: Insufficient documentation

## 2015-08-20 DIAGNOSIS — G894 Chronic pain syndrome: Secondary | ICD-10-CM | POA: Diagnosis not present

## 2015-08-20 DIAGNOSIS — Z87891 Personal history of nicotine dependence: Secondary | ICD-10-CM | POA: Insufficient documentation

## 2015-08-20 DIAGNOSIS — Z85038 Personal history of other malignant neoplasm of large intestine: Secondary | ICD-10-CM | POA: Insufficient documentation

## 2015-08-20 DIAGNOSIS — F0391 Unspecified dementia with behavioral disturbance: Secondary | ICD-10-CM | POA: Insufficient documentation

## 2015-08-20 DIAGNOSIS — Z7982 Long term (current) use of aspirin: Secondary | ICD-10-CM | POA: Diagnosis not present

## 2015-08-20 HISTORY — DX: Unspecified dementia, unspecified severity, without behavioral disturbance, psychotic disturbance, mood disturbance, and anxiety: F03.90

## 2015-08-20 LAB — COMPREHENSIVE METABOLIC PANEL
ALT: 14 U/L — ABNORMAL LOW (ref 17–63)
AST: 19 U/L (ref 15–41)
Albumin: 3.8 g/dL (ref 3.5–5.0)
Alkaline Phosphatase: 96 U/L (ref 38–126)
Anion gap: 8 (ref 5–15)
BUN: 31 mg/dL — AB (ref 6–20)
CHLORIDE: 110 mmol/L (ref 101–111)
CO2: 22 mmol/L (ref 22–32)
Calcium: 9.1 mg/dL (ref 8.9–10.3)
Creatinine, Ser: 1.57 mg/dL — ABNORMAL HIGH (ref 0.61–1.24)
GFR calc Af Amer: 41 mL/min — ABNORMAL LOW (ref >60)
GFR, EST NON AFRICAN AMERICAN: 35 mL/min — AB (ref >60)
Glucose, Bld: 87 mg/dL (ref 65–99)
POTASSIUM: 4.2 mmol/L (ref 3.5–5.1)
Sodium: 140 mmol/L (ref 135–145)
Total Bilirubin: 0.7 mg/dL (ref 0.3–1.2)
Total Protein: 7.1 g/dL (ref 6.5–8.1)

## 2015-08-20 LAB — CBC
HEMATOCRIT: 34.5 % — AB (ref 39.0–52.0)
Hemoglobin: 11.9 g/dL — ABNORMAL LOW (ref 13.0–17.0)
MCH: 30.8 pg (ref 26.0–34.0)
MCHC: 34.5 g/dL (ref 30.0–36.0)
MCV: 89.4 fL (ref 78.0–100.0)
Platelets: 209 10*3/uL (ref 150–400)
RBC: 3.86 MIL/uL — AB (ref 4.22–5.81)
RDW: 14.8 % (ref 11.5–15.5)
WBC: 4.1 10*3/uL (ref 4.0–10.5)

## 2015-08-20 LAB — URINALYSIS, ROUTINE W REFLEX MICROSCOPIC
Bilirubin Urine: NEGATIVE
GLUCOSE, UA: NEGATIVE mg/dL
Hgb urine dipstick: NEGATIVE
Ketones, ur: NEGATIVE mg/dL
LEUKOCYTES UA: NEGATIVE
Nitrite: NEGATIVE
PH: 6 (ref 5.0–8.0)
Protein, ur: NEGATIVE mg/dL
SPECIFIC GRAVITY, URINE: 1.008 (ref 1.005–1.030)

## 2015-08-20 LAB — CBG MONITORING, ED: GLUCOSE-CAPILLARY: 91 mg/dL (ref 65–99)

## 2015-08-20 MED ORDER — LORAZEPAM 0.5 MG PO TABS: 1.0000 mg | ORAL_TABLET | Freq: Every day | ORAL | Status: DC | PRN

## 2015-08-20 NOTE — ED Notes (Signed)
Per [facility] pt brought in due to altered mental status for 2 days. Per family pt has been getting up to use the bathroom more than usual but pt states "I am not producing my own urine." Pt has a hx of hypertension and dementia. Has not taken BP medications this am.

## 2015-08-20 NOTE — Discharge Instructions (Signed)

## 2015-08-20 NOTE — ED Provider Notes (Signed)
CSN: [id number]     Arrival date & time 08/20/15  [id number] History   First MD Initiated Contact with Patient 08/20/15 [phone]     Chief Complaint  Patient presents with  . Altered Mental Status      HPI  Howard Padilla is a 82 year old African-American male presents here with a change in his mental status from home.  Patient states that he lives at home with his wife. He states "I can't feel myself". He states urinating "too much". He states that he is Not drinking water "not enough". History dementia and hypertension. Denies any dysuria. No fevers. Mild headache. Denies falls or injury. No shortness of breath or chest pain. No abdominal pain.  Per the exam it is evidently has some dementia. However he is able to follow some simple questioning.   Past Medical History  Diagnosis Date  . Cancer ([place])     colon  . Hyperlipidemia   . Hypertension   . BPH (benign prostatic hypertrophy)   . DVT (deep venous thrombosis) ([place])     with pulmonary embo.  . Dementia    Past Surgical History  Procedure Laterality Date  . Colon surgery      partial colectomy  . Hernia repair      ingunial   Family History  Problem Relation Age of Onset  . Family history unknown: Yes   Social History  Substance Use Topics  . Smoking status: Former Smoker -- 2.00 packs/day for 30 years    Types: Cigarettes    Quit date: 02/28/1983  . Smokeless tobacco: Never Used  . Alcohol Use: No    Review of Systems  Unable to perform ROS: Dementia      Allergies  Penicillins  Home Medications   Prior to Admission medications   Medication Sig Start Date End Date Taking? Authorizing Provider  acetaminophen (TYLENOL) 500 MG tablet Take 2 tablets (1,000 mg total) by mouth every 6 (six) hours as needed. 12/09/14   Charlesetta Shanks, MD  aspirin 325 MG tablet Take 1 tablet (325 mg total) by mouth daily. 01/08/15   Kelvin Cellar, MD  doxazosin (CARDURA) 4 MG tablet TAKE ONE TABLET BY MOUTH AT BEDTIME 08/27/14    [name], MD  fosinopril (MONOPRIL) 20 MG tablet TAKE ONE TABLET BY MOUTH ONCE DAILY 08/02/15   [name], MD  Multiple Vitamin (MULTIVITAMIN WITH MINERALS) TABS tablet Take 1 tablet by mouth daily.    Historical Provider, MD  orphenadrine (NORFLEX) 100 MG tablet Take 1 tablet (100 mg total) by mouth 2 (two) times daily. 12/09/14   Charlesetta Shanks, MD   BP 184/89 mmHg  Pulse 74  Temp(Src) 97.9 F (36.6 C) (Oral)  Resp 18  SpO2 96% Physical Exam  Constitutional: He appears well-developed. He appears cachectic. No distress.  Thin frail appearing. However awake alert and attentive. Temporal muscle wasting. Mucous membranes are not dried.  HENT:  Head: Normocephalic.  Eyes: Conjunctivae are normal. Pupils are equal, round, and reactive to light. No scleral icterus.  Conjunctiva are not pale. No scleral icterus.  Neck: Normal range of motion. Neck supple. No thyromegaly present.  Cardiovascular: Normal rate and regular rhythm.  Exam reveals no gallop and no friction rub.   No murmur heard. Pulmonary/Chest: Effort normal and breath sounds normal. No respiratory distress. He has no wheezes. He has no rales.  Abdominal: Soft. Bowel sounds are normal. He exhibits no distension. There is no tenderness. There is no rebound.  Musculoskeletal: Normal  range of motion.  Neurological: He is alert.  Moving all 4 tremor is without deficit.  Skin: Skin is warm and dry. No rash noted.    ED Course  Procedures (including critical care time) Labs Review Labs Reviewed  URINALYSIS, ROUTINE W REFLEX MICROSCOPIC (NOT AT [facility])  COMPREHENSIVE METABOLIC PANEL  CBC  CBG MONITORING, ED  CBG MONITORING, ED    Imaging Review No results found. I have personally reviewed and evaluated these images and lab results as part of my medical decision-making.   EKG Interpretation None      MDM   Final diagnoses:  None   She complained of headache and polyuria. Will evaluate renal  function and sugars. CT head to rule out NPH. Metabolic evaluation chest x-ray reevaluation.  Reassuring studies. Is not hyperglycemic. Urine does not appear infected. No NPH. His wife arrives. She states that he occasionally will have episodes were he seems agitated. She is not feel unsafe and he has never been threatening specifically to her he was becoming "angry and yelled ". She states that he doesn't drink water and she is diabetic and she wasn't sure if it was stated to give him lemonade which is what he wants. She was also concerned he may be dehydrated. I reassured her that his kidney function, urine, and sugars are normal. Written a prescription for low-dose Ativan to give once per day only for any agitation. Astra contact his doctor for reevaluation within the next 1 week as well.    Tanna Furry, MD 08/20/15 1323

## 2015-08-21 LAB — URINE CULTURE

## 2015-09-30 ENCOUNTER — Other Ambulatory Visit: Payer: Self-pay | Admitting: Internal Medicine

## 2015-10-26 ENCOUNTER — Other Ambulatory Visit: Payer: Self-pay

## 2015-11-12 DIAGNOSIS — Z23 Encounter for immunization: Secondary | ICD-10-CM | POA: Diagnosis not present

## 2015-12-22 ENCOUNTER — Other Ambulatory Visit: Payer: Self-pay | Admitting: Internal Medicine

## 2015-12-27 ENCOUNTER — Other Ambulatory Visit: Payer: Self-pay | Admitting: Internal Medicine

## 2016-06-02 ENCOUNTER — Encounter (HOSPITAL_COMMUNITY): Payer: Self-pay | Admitting: *Deleted

## 2016-06-02 ENCOUNTER — Emergency Department (HOSPITAL_COMMUNITY)
Admission: EM | Admit: 2016-06-02 | Discharge: 2016-06-02 | Disposition: A | Discharge status: Home / Self Care | Payer: Medicare Other | Attending: Emergency Medicine | Admitting: Emergency Medicine

## 2016-06-02 DIAGNOSIS — Z7982 Long term (current) use of aspirin: Secondary | ICD-10-CM | POA: Insufficient documentation

## 2016-06-02 DIAGNOSIS — R350 Frequency of micturition: Secondary | ICD-10-CM | POA: Diagnosis present

## 2016-06-02 DIAGNOSIS — Z8673 Personal history of transient ischemic attack (TIA), and cerebral infarction without residual deficits: Secondary | ICD-10-CM | POA: Insufficient documentation

## 2016-06-02 DIAGNOSIS — Z85038 Personal history of other malignant neoplasm of large intestine: Secondary | ICD-10-CM | POA: Diagnosis not present

## 2016-06-02 DIAGNOSIS — Z87891 Personal history of nicotine dependence: Secondary | ICD-10-CM | POA: Insufficient documentation

## 2016-06-02 DIAGNOSIS — N183 Chronic kidney disease, stage 3 (moderate): Secondary | ICD-10-CM | POA: Diagnosis not present

## 2016-06-02 DIAGNOSIS — I129 Hypertensive chronic kidney disease with stage 1 through stage 4 chronic kidney disease, or unspecified chronic kidney disease: Secondary | ICD-10-CM | POA: Insufficient documentation

## 2016-06-02 DIAGNOSIS — Z79899 Other long term (current) drug therapy: Secondary | ICD-10-CM | POA: Diagnosis not present

## 2016-06-02 DIAGNOSIS — R5383 Other fatigue: Secondary | ICD-10-CM | POA: Diagnosis not present

## 2016-06-02 DIAGNOSIS — N390 Urinary tract infection, site not specified: Secondary | ICD-10-CM | POA: Diagnosis not present

## 2016-06-02 LAB — URINALYSIS, ROUTINE W REFLEX MICROSCOPIC
Bilirubin Urine: NEGATIVE
Glucose, UA: NEGATIVE mg/dL
Ketones, ur: 5 mg/dL — AB
Nitrite: POSITIVE — AB
PROTEIN: NEGATIVE mg/dL
SPECIFIC GRAVITY, URINE: 1.013 (ref 1.005–1.030)
SQUAMOUS EPITHELIAL / LPF: NONE SEEN
pH: 5 (ref 5.0–8.0)

## 2016-06-02 LAB — COMPREHENSIVE METABOLIC PANEL
ALT: 15 U/L — AB (ref 17–63)
AST: 22 U/L (ref 15–41)
Albumin: 3.3 g/dL — ABNORMAL LOW (ref 3.5–5.0)
Alkaline Phosphatase: 82 U/L (ref 38–126)
Anion gap: 6 (ref 5–15)
BUN: 32 mg/dL — AB (ref 6–20)
CHLORIDE: 109 mmol/L (ref 101–111)
CO2: 25 mmol/L (ref 22–32)
CREATININE: 1.56 mg/dL — AB (ref 0.61–1.24)
Calcium: 9 mg/dL (ref 8.9–10.3)
GFR calc Af Amer: 41 mL/min — ABNORMAL LOW (ref >60)
GFR calc non Af Amer: 35 mL/min — ABNORMAL LOW (ref >60)
GLUCOSE: 91 mg/dL (ref 65–99)
POTASSIUM: 4.3 mmol/L (ref 3.5–5.1)
Sodium: 140 mmol/L (ref 135–145)
Total Bilirubin: 0.8 mg/dL (ref 0.3–1.2)
Total Protein: 6.7 g/dL (ref 6.5–8.1)

## 2016-06-02 LAB — CBC WITH DIFFERENTIAL/PLATELET
Basophils Absolute: 0 10*3/uL (ref 0.0–0.1)
Basophils Relative: 0 %
EOS PCT: 1 %
Eosinophils Absolute: 0.1 10*3/uL (ref 0.0–0.7)
HCT: 32.9 % — ABNORMAL LOW (ref 39.0–52.0)
Hemoglobin: 11.1 g/dL — ABNORMAL LOW (ref 13.0–17.0)
LYMPHS PCT: 13 %
Lymphs Abs: 1 10*3/uL (ref 0.7–4.0)
MCH: 30.8 pg (ref 26.0–34.0)
MCHC: 33.7 g/dL (ref 30.0–36.0)
MCV: 91.4 fL (ref 78.0–100.0)
MONO ABS: 0.5 10*3/uL (ref 0.1–1.0)
Monocytes Relative: 7 %
Neutro Abs: 5.9 10*3/uL (ref 1.7–7.7)
Neutrophils Relative %: 79 %
PLATELETS: 182 10*3/uL (ref 150–400)
RBC: 3.6 MIL/uL — AB (ref 4.22–5.81)
RDW: 15.5 % (ref 11.5–15.5)
WBC: 7.5 10*3/uL (ref 4.0–10.5)

## 2016-06-02 LAB — I-STAT CG4 LACTIC ACID, ED: LACTIC ACID, VENOUS: 0.75 mmol/L (ref 0.5–1.9)

## 2016-06-02 MED ORDER — SODIUM CHLORIDE 0.9 % IV BOLUS (SEPSIS)
1000.0000 mL | Freq: Once | INTRAVENOUS | Status: AC
  Administered 2016-06-02: 1000 mL via INTRAVENOUS

## 2016-06-02 MED ORDER — CEFTRIAXONE SODIUM 1 G IJ SOLR
1.0000 g | Freq: Once | INTRAMUSCULAR | Status: AC
  Administered 2016-06-02: 1 g via INTRAVENOUS
  Filled 2016-06-02: qty 10

## 2016-06-02 MED ORDER — CEPHALEXIN 500 MG PO CAPS: 500.0000 mg | ORAL_CAPSULE | Freq: Four times a day (QID) | ORAL | 0 refills | Status: AC

## 2016-06-02 NOTE — ED Notes (Signed)
Pt was given crackers and sandwich as well as a water.  No distress noted

## 2016-06-02 NOTE — ED Triage Notes (Signed)
Pt's wife states the pt has increased urination for the past 2 days. Pt wears depends at night and states they are saturated with urine, which is abnormal for him. Pt sometimes has the urge to urinate but nothing comes out when he tries.

## 2016-06-02 NOTE — ED Provider Notes (Signed)
[facility] DEPT Provider Note   CSN: [id number] Arrival date & time: 06/02/16  1253     History   Chief Complaint Chief Complaint  Patient presents with  . Urinary Frequency    HPI Howard Padilla is a 83 y.o. male.  HPI   83 yo M with PMHx as below including BPH, DVT, mild dementia here with urinary frequency. According to pt and wife, over past 2 days pt has had increasing frequency and incontinence. He is baseline mildly incontinent, mainly at night, but has had incontinence throughout day. He reports mild fatigue, dysuria but no flank pain. No vomiting or fevers. No chills. Pt o/w at her baseline state of heatlh. No chest pain. No recent med changes. Wife states he is at his neuro baseline, has had a normal appetite and normal mobility.  Past Medical History:  Diagnosis Date  . BPH (benign prostatic hypertrophy)   . Cancer ([place])    colon  . Dementia   . DVT (deep venous thrombosis) ([place])    with pulmonary embo.  Marland Kitchen Hyperlipidemia   . Hypertension     Patient Active Problem List   Diagnosis Date Noted  . Aphasia 01/06/2015  . Stroke ([place]) 01/06/2015  . Slurred speech   . TIA (transient ischemic attack)   . Urinary retention 12/25/2014  . Lung nodule 06/24/2013  . CAP (community acquired pneumonia) 06/23/2013  . 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 Lung abscess ([place]) 06/22/2013  . CKD (chronic kidney disease) stage 3, GFR 30-59 ml/min 06/22/2013  . OTHER ABNORMAL BLOOD CHEMISTRY 02/03/2010  . PERSONAL HISTORY OF THROMBOPHLEBITIS 02/03/2010  . WEIGHT LOSS 10/29/2008  . DVT 06/24/2007  . LEG PAIN, LEFT 05/28/2007  . SYNCOPE 05/28/2007  . ACUTE BRONCHITIS 05/25/2007  . Anemia of renal disease 11/05/2006  . HLD (hyperlipidemia) 07/30/2006  . Essential hypertension 07/30/2006  . BPH (benign prostatic hyperplasia) 07/30/2006  . History of malignant neoplasm of large intestine 07/30/2006    Past Surgical History:  Procedure Laterality Date  . COLON SURGERY      partial colectomy  . HERNIA REPAIR     ingunial       Home Medications    Prior to Admission medications   Medication Sig Start Date End Date Taking? Authorizing Provider  acetaminophen (TYLENOL) 500 MG tablet Take 2 tablets (1,000 mg total) by mouth every 6 (six) hours as needed. Patient taking differently: Take 1,000 mg by mouth every 6 (six) hours as needed for moderate pain or headache.  12/09/14   Charlesetta Shanks, MD  aspirin 325 MG tablet Take 1 tablet (325 mg total) by mouth daily. 01/08/15   Kelvin Cellar, MD  cephALEXin (KEFLEX) 500 MG capsule Take 1 capsule (500 mg total) by mouth 4 (four) times daily. 06/02/16 06/09/16  Duffy Bruce, MD  doxazosin (CARDURA) 4 MG tablet TAKE ONE TABLET BY MOUTH AT BEDTIME 08/27/14   [name], MD  doxazosin (CARDURA) 4 MG tablet TAKE ONE TABLET BY MOUTH AT BEDTIME 12/23/15   [name], MD  doxazosin (CARDURA) 4 MG tablet TAKE ONE TABLET BY MOUTH AT BEDTIME 12/27/15   [name], MD  fosinopril (MONOPRIL) 20 MG tablet TAKE ONE TABLET BY MOUTH ONCE DAILY 08/02/15   [name], MD  LORazepam (ATIVAN) 0.5 MG tablet Take 2 tablets (1 mg total) by mouth daily as needed for anxiety (agitation). 08/20/15   Tanna Furry, MD  Multiple Vitamin (MULTIVITAMIN WITH MINERALS) TABS tablet Take 1 tablet by mouth daily.  Historical Provider, MD  orphenadrine (NORFLEX) 100 MG tablet Take 1 tablet (100 mg total) by mouth 2 (two) times daily. 12/09/14   Charlesetta Shanks, MD    Family History Family History  Problem Relation Age of Onset  . Family history unknown: Yes    Social History Social History  Substance Use Topics  . Smoking status: Former Smoker    Packs/day: 2.00    Years: 30.00    Types: Cigarettes    Quit date: 02/28/1983  . Smokeless tobacco: Never Used  . Alcohol use No     Allergies   Penicillins   Review of Systems Review of Systems  Constitutional: Positive for fatigue.  HENT: Negative for  congestion and rhinorrhea.   Eyes: Negative for visual disturbance.  Respiratory: Negative for cough and shortness of breath.   Cardiovascular: Negative for chest pain.  Gastrointestinal: Negative for abdominal pain, diarrhea, nausea and vomiting.  Genitourinary: Positive for dysuria and frequency.  Musculoskeletal: Negative for neck stiffness.  Skin: Negative for rash.  Allergic/Immunologic: Negative for immunocompromised state.  Neurological: Negative for weakness and headaches.  All other systems reviewed and are negative.    Physical Exam Updated Vital Signs BP (!) 190/91 (BP Location: Left Arm)   Pulse 78   Temp 98 F (36.7 C) (Oral)   Resp 16   SpO2 97%   Physical Exam  Constitutional: He is oriented to person, place, and time. He appears well-developed and well-nourished. No distress.  HENT:  Head: Normocephalic and atraumatic.  Mouth/Throat: Oropharynx is clear and moist.  Eyes: Conjunctivae are normal.  Neck: Neck supple.  Cardiovascular: Normal rate, regular rhythm and normal heart sounds.  Exam reveals no friction rub.   No murmur heard. Pulmonary/Chest: Effort normal and breath sounds normal. No respiratory distress. He has no wheezes. He has no rales.  Abdominal: Soft. Bowel sounds are normal. He exhibits no distension. There is no tenderness. There is no guarding.  No CVAT bilaterally  Musculoskeletal: He exhibits no edema.  Neurological: He is alert and oriented to person, place, and time. He exhibits normal muscle tone.  Skin: Skin is warm. Capillary refill takes less than 2 seconds.  Psychiatric: He has a normal mood and affect.  Nursing note and vitals reviewed.    ED Treatments / Results  Labs (all labs ordered are listed, but only abnormal results are displayed) Labs Reviewed  URINALYSIS, ROUTINE W REFLEX MICROSCOPIC - Abnormal; Notable for the following:       Result Value   APPearance CLOUDY (*)    Hgb urine dipstick LARGE (*)    Ketones, ur 5  (*)    Nitrite POSITIVE (*)    Leukocytes, UA LARGE (*)    Bacteria, UA RARE (*)    All other components within normal limits  CBC WITH DIFFERENTIAL/PLATELET - Abnormal; Notable for the following:    RBC 3.60 (*)    Hemoglobin 11.1 (*)    HCT 32.9 (*)    All other components within normal limits  COMPREHENSIVE METABOLIC PANEL - Abnormal; Notable for the following:    BUN 32 (*)    Creatinine, Ser 1.56 (*)    Albumin 3.3 (*)    ALT 15 (*)    GFR calc non Af Amer 35 (*)    GFR calc Af Amer 41 (*)    All other components within normal limits  URINE CULTURE  I-STAT CG4 LACTIC ACID, ED    EKG  EKG Interpretation None  Radiology No results found.  Procedures Procedures (including critical care time)  Medications Ordered in ED Medications  sodium chloride 0.9 % bolus 1,000 mL (0 mLs Intravenous Stopped 06/02/16 1530)  cefTRIAXone (ROCEPHIN) 1 g in dextrose 5 % 50 mL IVPB (0 g Intravenous Stopped 06/02/16 1529)     Initial Impression / Assessment and Plan / ED Course  I have reviewed the triage vital signs and the nursing notes.  Pertinent labs & imaging results that were available during my care of the patient were reviewed by me and considered in my medical decision making (see chart for details).    83 yo M with PMHx as above here with urinary frequency, mild dysuria. On arrival, VSS and WNL. Exam is as above. No CVAT bilaterally. Suspect mild symptomatic UTI. UA c/w UTI with pos nitrites, pyuria. No CVAT, leukocytosis, fever, n/v, or evidence to suggest pyelo or complicated UTI. Pt is at neuro baseline. Given his age, given dose of IV rocephin here and will d/c with keflex. Return precautions given. UCx sent.  Final Clinical Impressions(s) / ED Diagnoses   Final diagnoses:  Lower urinary tract infection    New Prescriptions New Prescriptions   CEPHALEXIN (KEFLEX) 500 MG CAPSULE    Take 1 capsule (500 mg total) by mouth 4 (four) times daily.     Duffy Bruce, MD 06/02/16 [phone]

## 2016-06-02 NOTE — ED Notes (Signed)
Pt ambulated to the bathroom to void.  I went with him, he had no difficulties

## 2016-06-04 LAB — URINE CULTURE: Culture: >=100000 — AB

## 2016-06-05 ENCOUNTER — Telehealth: Payer: Self-pay | Admitting: Emergency Medicine

## 2016-06-05 NOTE — Telephone Encounter (Signed)
Post ED Visit - Positive Culture Follow-up  Culture report reviewed by antimicrobial stewardship pharmacist:  []  Elenor Quinones, Pharm.D. [x]  Heide Guile, Pharm.D., BCPS AQ-ID []  Parks Neptune, Pharm.D., BCPS []  Alycia Rossetti, Pharm.D., BCPS []  [place], Pharm.D., BCPS, AAHIVP []  Legrand Como, Pharm.D., BCPS, AAHIVP []  Salome Arnt, PharmD, BCPS []  Dimitri Ped, PharmD, BCPS []  Vincenza Hews, PharmD, BCPS  Positive urine culture Treated with cephalexin, organism sensitive to the same and no further patient follow-up is required at this time.  Hazle Nordmann 06/05/2016, 9:49 AM

## 2016-07-03 ENCOUNTER — Other Ambulatory Visit: Payer: Self-pay | Admitting: Internal Medicine

## 2016-07-14 ENCOUNTER — Observation Stay (HOSPITAL_COMMUNITY)
Admission: EM | Admit: 2016-07-14 | Discharge: 2016-07-15 | Disposition: A | Discharge status: Home / Self Care | Payer: Medicare Other | Attending: Family Medicine | Admitting: Family Medicine

## 2016-07-14 ENCOUNTER — Encounter (HOSPITAL_COMMUNITY): Payer: Self-pay | Admitting: Emergency Medicine

## 2016-07-14 ENCOUNTER — Emergency Department (HOSPITAL_COMMUNITY): Payer: Medicare Other

## 2016-07-14 DIAGNOSIS — I959 Hypotension, unspecified: Secondary | ICD-10-CM | POA: Diagnosis not present

## 2016-07-14 DIAGNOSIS — R471 Dysarthria and anarthria: Secondary | ICD-10-CM

## 2016-07-14 DIAGNOSIS — I69320 Aphasia following cerebral infarction: Secondary | ICD-10-CM | POA: Diagnosis not present

## 2016-07-14 DIAGNOSIS — R112 Nausea with vomiting, unspecified: Secondary | ICD-10-CM | POA: Diagnosis not present

## 2016-07-14 DIAGNOSIS — R4781 Slurred speech: Secondary | ICD-10-CM | POA: Diagnosis not present

## 2016-07-14 DIAGNOSIS — I6789 Other cerebrovascular disease: Secondary | ICD-10-CM | POA: Diagnosis not present

## 2016-07-14 DIAGNOSIS — F0390 Unspecified dementia without behavioral disturbance: Secondary | ICD-10-CM

## 2016-07-14 DIAGNOSIS — R29818 Other symptoms and signs involving the nervous system: Secondary | ICD-10-CM | POA: Diagnosis not present

## 2016-07-14 DIAGNOSIS — I739 Peripheral vascular disease, unspecified: Secondary | ICD-10-CM | POA: Insufficient documentation

## 2016-07-14 DIAGNOSIS — N183 Chronic kidney disease, stage 3 (moderate): Secondary | ICD-10-CM | POA: Diagnosis not present

## 2016-07-14 DIAGNOSIS — N401 Enlarged prostate with lower urinary tract symptoms: Secondary | ICD-10-CM | POA: Diagnosis not present

## 2016-07-14 DIAGNOSIS — Z87891 Personal history of nicotine dependence: Secondary | ICD-10-CM | POA: Diagnosis not present

## 2016-07-14 DIAGNOSIS — Z85038 Personal history of other malignant neoplasm of large intestine: Secondary | ICD-10-CM | POA: Diagnosis not present

## 2016-07-14 DIAGNOSIS — Z79899 Other long term (current) drug therapy: Secondary | ICD-10-CM | POA: Insufficient documentation

## 2016-07-14 DIAGNOSIS — Z7982 Long term (current) use of aspirin: Secondary | ICD-10-CM | POA: Diagnosis not present

## 2016-07-14 DIAGNOSIS — Z86718 Personal history of other venous thrombosis and embolism: Secondary | ICD-10-CM | POA: Diagnosis not present

## 2016-07-14 DIAGNOSIS — N400 Enlarged prostate without lower urinary tract symptoms: Secondary | ICD-10-CM | POA: Diagnosis present

## 2016-07-14 DIAGNOSIS — E785 Hyperlipidemia, unspecified: Secondary | ICD-10-CM | POA: Insufficient documentation

## 2016-07-14 DIAGNOSIS — R29702 NIHSS score 2: Secondary | ICD-10-CM | POA: Insufficient documentation

## 2016-07-14 DIAGNOSIS — Z880 Allergy status to penicillin: Secondary | ICD-10-CM | POA: Insufficient documentation

## 2016-07-14 DIAGNOSIS — Z86711 Personal history of pulmonary embolism: Secondary | ICD-10-CM | POA: Diagnosis not present

## 2016-07-14 DIAGNOSIS — Z9049 Acquired absence of other specified parts of digestive tract: Secondary | ICD-10-CM | POA: Insufficient documentation

## 2016-07-14 DIAGNOSIS — R001 Bradycardia, unspecified: Secondary | ICD-10-CM | POA: Diagnosis not present

## 2016-07-14 DIAGNOSIS — I679 Cerebrovascular disease, unspecified: Secondary | ICD-10-CM

## 2016-07-14 LAB — CBC
HCT: 31.4 % — ABNORMAL LOW (ref 39.0–52.0)
Hemoglobin: 10.7 g/dL — ABNORMAL LOW (ref 13.0–17.0)
MCH: 30.8 pg (ref 26.0–34.0)
MCHC: 34.1 g/dL (ref 30.0–36.0)
MCV: 90.5 fL (ref 78.0–100.0)
PLATELETS: 155 10*3/uL (ref 150–400)
RBC: 3.47 MIL/uL — ABNORMAL LOW (ref 4.22–5.81)
RDW: 16 % — AB (ref 11.5–15.5)
WBC: 3.9 10*3/uL — ABNORMAL LOW (ref 4.0–10.5)

## 2016-07-14 LAB — COMPREHENSIVE METABOLIC PANEL
ALBUMIN: 3.4 g/dL — AB (ref 3.5–5.0)
ALK PHOS: 73 U/L (ref 38–126)
ALT: 12 U/L — ABNORMAL LOW (ref 17–63)
ANION GAP: 9 (ref 5–15)
AST: 22 U/L (ref 15–41)
BILIRUBIN TOTAL: 0.9 mg/dL (ref 0.3–1.2)
BUN: 28 mg/dL — ABNORMAL HIGH (ref 6–20)
CO2: 18 mmol/L — ABNORMAL LOW (ref 22–32)
Calcium: 8.8 mg/dL — ABNORMAL LOW (ref 8.9–10.3)
Chloride: 113 mmol/L — ABNORMAL HIGH (ref 101–111)
Creatinine, Ser: 1.74 mg/dL — ABNORMAL HIGH (ref 0.61–1.24)
GFR calc Af Amer: 36 mL/min — ABNORMAL LOW (ref >60)
GFR, EST NON AFRICAN AMERICAN: 31 mL/min — AB (ref >60)
GLUCOSE: 98 mg/dL (ref 65–99)
Potassium: 4.1 mmol/L (ref 3.5–5.1)
Sodium: 140 mmol/L (ref 135–145)
Total Protein: 6.1 g/dL — ABNORMAL LOW (ref 6.5–8.1)

## 2016-07-14 LAB — URINALYSIS, ROUTINE W REFLEX MICROSCOPIC
Bacteria, UA: NONE SEEN
Bilirubin Urine: NEGATIVE
GLUCOSE, UA: NEGATIVE mg/dL
KETONES UR: 5 mg/dL — AB
LEUKOCYTES UA: NEGATIVE
NITRITE: NEGATIVE
PROTEIN: NEGATIVE mg/dL
Specific Gravity, Urine: 1.014 (ref 1.005–1.030)
Squamous Epithelial / LPF: NONE SEEN
pH: 5 (ref 5.0–8.0)

## 2016-07-14 LAB — I-STAT CHEM 8, ED
BUN: 29 mg/dL — ABNORMAL HIGH (ref 6–20)
CALCIUM ION: 1.1 mmol/L — AB (ref 1.15–1.40)
Chloride: 111 mmol/L (ref 101–111)
Creatinine, Ser: 1.8 mg/dL — ABNORMAL HIGH (ref 0.61–1.24)
GLUCOSE: 93 mg/dL (ref 65–99)
HCT: 31 % — ABNORMAL LOW (ref 39.0–52.0)
Hemoglobin: 10.5 g/dL — ABNORMAL LOW (ref 13.0–17.0)
Potassium: 4.1 mmol/L (ref 3.5–5.1)
SODIUM: 141 mmol/L (ref 135–145)
TCO2: 20 mmol/L (ref 0–100)

## 2016-07-14 LAB — DIFFERENTIAL
BASOS PCT: 0 %
Basophils Absolute: 0 10*3/uL (ref 0.0–0.1)
EOS PCT: 4 %
Eosinophils Absolute: 0.1 10*3/uL (ref 0.0–0.7)
LYMPHS PCT: 42 %
Lymphs Abs: 1.6 10*3/uL (ref 0.7–4.0)
MONO ABS: 0.3 10*3/uL (ref 0.1–1.0)
MONOS PCT: 7 %
Neutro Abs: 1.9 10*3/uL (ref 1.7–7.7)
Neutrophils Relative %: 47 %

## 2016-07-14 LAB — RAPID URINE DRUG SCREEN, HOSP PERFORMED
AMPHETAMINES: NOT DETECTED
BARBITURATES: NOT DETECTED
Benzodiazepines: NOT DETECTED
Cocaine: NOT DETECTED
Opiates: NOT DETECTED
TETRAHYDROCANNABINOL: NOT DETECTED

## 2016-07-14 LAB — PROTIME-INR
INR: 1.25
PROTHROMBIN TIME: 15.7 s — AB (ref 11.4–15.2)

## 2016-07-14 LAB — I-STAT TROPONIN, ED: Troponin i, poc: 0.02 ng/mL (ref 0.00–0.08)

## 2016-07-14 LAB — TROPONIN I: Troponin I: 0.06 ng/mL (ref <0.03)

## 2016-07-14 LAB — APTT: aPTT: 28 seconds (ref 24–36)

## 2016-07-14 LAB — ETHANOL

## 2016-07-14 MED ORDER — ASPIRIN 325 MG PO TABS
325.0000 mg | ORAL_TABLET | Freq: Every day | ORAL | Status: DC
  Administered 2016-07-14 – 2016-07-15 (×2): 325 mg via ORAL
  Filled 2016-07-14 (×2): qty 1

## 2016-07-14 MED ORDER — BISACODYL 5 MG PO TBEC: 5.0000 mg | DELAYED_RELEASE_TABLET | Freq: Every day | ORAL | Status: DC | PRN

## 2016-07-14 MED ORDER — ONDANSETRON HCL 4 MG PO TABS: 4.0000 mg | ORAL_TABLET | Freq: Four times a day (QID) | ORAL | Status: DC | PRN

## 2016-07-14 MED ORDER — ONDANSETRON HCL 4 MG/2ML IJ SOLN
4.0000 mg | Freq: Once | INTRAMUSCULAR | Status: AC
  Administered 2016-07-14: 4 mg via INTRAVENOUS

## 2016-07-14 MED ORDER — ONDANSETRON HCL 4 MG/2ML IJ SOLN
INTRAMUSCULAR | Status: AC
  Filled 2016-07-14: qty 2

## 2016-07-14 MED ORDER — ONDANSETRON HCL 4 MG/2ML IJ SOLN: 4.0000 mg | Freq: Four times a day (QID) | INTRAMUSCULAR | Status: DC | PRN

## 2016-07-14 MED ORDER — OXYCODONE HCL 5 MG PO TABS: 5.0000 mg | ORAL_TABLET | ORAL | Status: DC | PRN

## 2016-07-14 MED ORDER — SODIUM CHLORIDE 0.9 % IV BOLUS (SEPSIS)
1000.0000 mL | Freq: Once | INTRAVENOUS | Status: AC
  Administered 2016-07-14: 1000 mL via INTRAVENOUS

## 2016-07-14 MED ORDER — ACETAMINOPHEN 500 MG PO TABS: 1000.0000 mg | ORAL_TABLET | Freq: Four times a day (QID) | ORAL | Status: DC | PRN

## 2016-07-14 MED ORDER — SENNOSIDES-DOCUSATE SODIUM 8.6-50 MG PO TABS: 1.0000 | ORAL_TABLET | Freq: Every evening | ORAL | Status: DC | PRN

## 2016-07-14 MED ORDER — ENOXAPARIN SODIUM 40 MG/0.4ML ~~LOC~~ SOLN
40.0000 mg | SUBCUTANEOUS | Status: DC
  Administered 2016-07-14: 40 mg via SUBCUTANEOUS
  Filled 2016-07-14: qty 0.4

## 2016-07-14 MED ORDER — DOXAZOSIN MESYLATE 4 MG PO TABS
4.0000 mg | ORAL_TABLET | Freq: Every day | ORAL | Status: DC
  Administered 2016-07-14: 4 mg via ORAL
  Filled 2016-07-14: qty 1
  Filled 2016-07-14: qty 2

## 2016-07-14 MED ORDER — ADULT MULTIVITAMIN W/MINERALS CH
1.0000 | ORAL_TABLET | Freq: Every day | ORAL | Status: DC
  Administered 2016-07-14 – 2016-07-15 (×2): 1 via ORAL
  Filled 2016-07-14 (×2): qty 1

## 2016-07-14 MED ORDER — SODIUM CHLORIDE 0.9% FLUSH: 3.0000 mL | Freq: Two times a day (BID) | INTRAVENOUS | Status: DC

## 2016-07-14 MED ORDER — LORAZEPAM 1 MG PO TABS: 1.0000 mg | ORAL_TABLET | Freq: Every day | ORAL | Status: DC | PRN

## 2016-07-14 MED ORDER — SODIUM CHLORIDE 0.9 % IV SOLN
INTRAVENOUS | Status: AC
  Administered 2016-07-14 – 2016-07-15 (×2): via INTRAVENOUS

## 2016-07-14 NOTE — ED Notes (Signed)
EDP aware patient upset, stating " I am ready to die" " I am tired". " I just want to die".

## 2016-07-14 NOTE — ED Notes (Signed)
Patient became Nausea patient give 4mg  Zofran.

## 2016-07-14 NOTE — ED Notes (Signed)
Family at bedside. 

## 2016-07-14 NOTE — Consult Note (Signed)
Neurology Consult Note  Reason for Consultation: CODE STROKE  Requesting provider: Activated by EMS; ED MD Vanita Panda  CC: Nausea  HPI: 76-yo man brought to ED by EMS as a CODE STROKE.  History is obtained from EMS as the patient is unable to offer much information. EMTs report that they were called for a complaint of slurred speech. They arrived to find the patient with initial BP 60/P and oxygen saturation 92%. They did not see any facial droop or focal deficits. Speech improved after a fluid bolus and correction of his blood pressure. I met the patient on arrival in the ED> He appears demented and is edentulous. He has mildly slurred speech, no other significant deficits. He had an episode of emesis during the assessment. NIHSS score was 2. He was taken for emergent [facility] which showed no acute abnormality. CODE STROKE was canceled as symptoms do not appear to be due to stroke.   Last known well: 1015 today NHISS score: 2 tPA given?: No, symptoms appear to have resolved with correction of hypotension   PMH:  Past Medical History:  Diagnosis Date  . BPH (benign prostatic hypertrophy)   . Cancer ([place])    colon  . Dementia   . DVT (deep venous thrombosis) ([place])    with pulmonary embo.  Marland Kitchen Hyperlipidemia   . Hypertension     PSH:  Past Surgical History:  Procedure Laterality Date  . COLON SURGERY     partial colectomy  . HERNIA REPAIR     ingunial    Family history: Family History  Problem Relation Age of Onset  . Family history unknown: Yes    Social history:  Social History   Social History  . Marital status: Married    Spouse name: N/A  . Number of children: N/A  . Years of education: N/A   Occupational History  . Not on file.   Social History Main Topics  . Smoking status: Former Smoker    Packs/day: 2.00    Years: 30.00    Types: Cigarettes    Quit date: 02/28/1983  . Smokeless tobacco: Never Used  . Alcohol use No  . Drug use: No  . Sexual activity: Not on  file   Other Topics Concern  . Not on file   Social History Narrative  . No narrative on file    Current inpatient meds: Medications reviewed and reconciled Current Facility-Administered Medications  Medication Dose Route Frequency Provider Last Rate Last Dose  . ondansetron (ZOFRAN) 4 MG/2ML injection            Current Outpatient Prescriptions  Medication Sig Dispense Refill  . acetaminophen (TYLENOL) 500 MG tablet Take 2 tablets (1,000 mg total) by mouth every 6 (six) hours as needed. (Patient taking differently: Take 1,000 mg by mouth every 6 (six) hours as needed for moderate pain or headache. ) 30 tablet 0  . aspirin 325 MG tablet Take 1 tablet (325 mg total) by mouth daily. 30 tablet 1  . doxazosin (CARDURA) 4 MG tablet TAKE ONE TABLET BY MOUTH AT BEDTIME 90 tablet 0  . doxazosin (CARDURA) 4 MG tablet TAKE ONE TABLET BY MOUTH AT BEDTIME 90 tablet 0  . doxazosin (CARDURA) 4 MG tablet TAKE ONE TABLET BY MOUTH AT BEDTIME 30 tablet 0  . fosinopril (MONOPRIL) 20 MG tablet TAKE ONE TABLET BY MOUTH ONCE DAILY 90 tablet 3  . LORazepam (ATIVAN) 0.5 MG tablet Take 2 tablets (1 mg total) by mouth daily as needed  for anxiety (agitation). 10 tablet 0  . Multiple Vitamin (MULTIVITAMIN WITH MINERALS) TABS tablet Take 1 tablet by mouth daily.    . orphenadrine (NORFLEX) 100 MG tablet Take 1 tablet (100 mg total) by mouth 2 (two) times daily. 30 tablet 0    Allergies: Allergies  Allergen Reactions  . Penicillins Rash    Has patient had a PCN reaction causing immediate rash, facial/tongue/throat swelling, SOB or lightheadedness with hypotension: No Has patient had a PCN reaction causing severe rash involving mucus membranes or skin necrosis: No Has patient had a PCN reaction that required hospitalization No Has patient had a PCN reaction occurring within the last 10 years: No If all of the above answers are "NO", then may proceed with Cephalosporin use.     ROS: As per HPI. A full  14-point review of systems was performed and is otherwise unremarkable.  PE:  T 97.89F       HR 68     BP 111/61      SpO2 99% RA General: Cachectic-appearing AA male on ED gurney. He is alert and appropriate. Oriented to self only. He has mild dysarthria and some mild word-finding difficulty. Follows commands well. HEENT: Normocephalic. Neck supple without LAD. MMM, OP clear. Edentulous. Sclerae anicteric. No conjunctival injection.  CV: Regular, no murmur. Carotid pulses full and symmetric, no bruits. Distal pulses 2+ and symmetric.  Lungs: CTAB.  Abdomen: Soft, non-distended, non-tender. Bowel sounds present x4.  Extremities: Mild edema BLE. Neuro:  CN: Pupils are equal and round. They are symmetrically reactive from 2-->1 mm. EOMI without nystagmus. There is marked breakup of smooth pursuits in all directions of gaze. No reported diplopia. Facial sensation is intact to light touch. Face is symmetric at rest with normal strength and mobility. Hearing appears intact to conversational voice. Palate elevates symmetrically and uvula is midline. Voice is normal in tone, pitch and quality. Bilateral SCM and trapezii are 5/5. Tongue is midline with normal bulk and mobility.  Motor: Reduced bulk throughout. Normal tone with mitgehen paratonia. Strength is notable for mild proximal weakness in BLE. No tremor or other abnormal movements. No drift.  Sensation: Intact to light touch.  DTRs: 2+, symmetric. Toes downgoing bilaterally. No pathologic reflexes.  Coordination: Finger-to-nose and heel-to-shin are without dysmetria.   Labs:  Lab Results  Component Value Date   WBC 3.9 (L) 07/14/2016   HGB 10.5 (L) 07/14/2016   HCT 31.0 (L) 07/14/2016   PLT 155 07/14/2016   GLUCOSE 93 07/14/2016   CHOL 213 (H) 01/06/2015   TRIG 67 01/06/2015   HDL 61 01/06/2015   LDLCALC 139 (H) 01/06/2015   ALT 15 (L) 06/02/2016   AST 22 06/02/2016   NA 141 07/14/2016   K 4.1 07/14/2016   CL 111 07/14/2016    CREATININE 1.80 (H) 07/14/2016   BUN 29 (H) 07/14/2016   CO2 25 06/02/2016   TSH 1.44 03/05/2014   INR 1.11 01/06/2015   HGBA1C 6.0 (H) 01/06/2015   Troponin 0.02  Imaging:  I have personally and independently reviewed the [facility] without contrast from today. This shows a chronic L frontoparietal stroke. There is severe chronic small vessel ischemic disease in the bihemispheric white matter. There is moderate to severe diffuse generalized atrophy. Old lacunar infarcts are noted in the basal ganglia and thalamus.  Assessment and Plan:  1. Dysarthria: He has mild dysarthria that is chronic per report. I suspect this worsened due to global cerebral hypoperfusion in the setting of hypotension. The  patient feels that his speech is at baseline. No focal deficits seen on exam. Follow.   2. Cerebrovascular disease: He has severe chronic small vessel disease on CTH with a large old stroke in the L frontoparietal region. No acute abnormality. Known risk factors include age, HTN, hyperlipidemia, and prior stroke. Continue risk factor modification. Continue aspirin.   3. Dementia: He has a listed diagnosis of dementia. He is presently oriented to self with some slowed speed of processing. I suspect this is most likely a vascular dementia. No acute issues.   No family was present at the time of my visit.   I discussed the above impression and recommendations with Dr. Vanita Panda, ED MD.   I have no further recommendations and will sign off. Please call if any new issues arise.

## 2016-07-14 NOTE — ED Triage Notes (Signed)
Patient presents from home with complaints of slurred speech. Patient was not following commands per EMS. Patient BP 60 palpated. Patient given 250 CC of NS. Patient CBG 116. Patient moving all extremities. transferred self to CT scanner. Patient denies any pain at this time.

## 2016-07-14 NOTE — ED Notes (Signed)
Patient became Howard Padilla HR 30 Patient start to vomit. MD made aware. BP At bedside.

## 2016-07-14 NOTE — ED Provider Notes (Addendum)
[facility] DEPT Provider Note   CSN: [id number] Arrival date & time: 07/14/16  1124     History   Chief Complaint Chief Complaint  Patient presents with  . Cerebrovascular Accident    HPI Howard Padilla is a 83 y.o. male.  HPI  Patient presents from home designated as a code stroke due to concern for speech deficit.  The patient himself is awake, alert, denies changes from baseline medical condition, states that he has had abnormal speech for a long time, denies new weakness anywhere, denies confusion, denies vision changes. Per report the patient was referred to EMS after he had been noticed by family members to have decreased speech capacity as well as not following commands. EMS reports that on arrival the patient was not following commands, had hypotension on arrival. Patient received normal saline, had substantial improvement, with no ongoing diminished interactivity.    Past Medical History:  Diagnosis Date  . BPH (benign prostatic hypertrophy)   . Cancer ([place])    colon  . Dementia   . DVT (deep venous thrombosis) ([place])    with pulmonary embo.  Marland Kitchen Hyperlipidemia   . Hypertension     Patient Active Problem List   Diagnosis Date Noted  . Aphasia 01/06/2015  . Stroke ([place]) 01/06/2015  . Slurred speech   . TIA (transient ischemic attack)   . Urinary retention 12/25/2014  . Lung nodule 06/24/2013  . CAP (community acquired pneumonia) 06/23/2013  . 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 Lung abscess ([place]) 06/22/2013  . CKD (chronic kidney disease) stage 3, GFR 30-59 ml/min 06/22/2013  . OTHER ABNORMAL BLOOD CHEMISTRY 02/03/2010  . PERSONAL HISTORY OF THROMBOPHLEBITIS 02/03/2010  . WEIGHT LOSS 10/29/2008  . DVT 06/24/2007  . LEG PAIN, LEFT 05/28/2007  . SYNCOPE 05/28/2007  . ACUTE BRONCHITIS 05/25/2007  . Anemia of renal disease 11/05/2006  . HLD (hyperlipidemia) 07/30/2006  . Essential hypertension 07/30/2006  . BPH (benign prostatic hyperplasia)  07/30/2006  . History of malignant neoplasm of large intestine 07/30/2006    Past Surgical History:  Procedure Laterality Date  . COLON SURGERY     partial colectomy  . HERNIA REPAIR     ingunial       Home Medications    Prior to Admission medications   Medication Sig Start Date End Date Taking? Authorizing Provider  acetaminophen (TYLENOL) 500 MG tablet Take 2 tablets (1,000 mg total) by mouth every 6 (six) hours as needed. Patient taking differently: Take 1,000 mg by mouth every 6 (six) hours as needed for moderate pain or headache.  12/09/14   Charlesetta Shanks, MD  aspirin 325 MG tablet Take 1 tablet (325 mg total) by mouth daily. 01/08/15   Kelvin Cellar, MD  doxazosin (CARDURA) 4 MG tablet TAKE ONE TABLET BY MOUTH AT BEDTIME 08/27/14   [name], MD  doxazosin (CARDURA) 4 MG tablet TAKE ONE TABLET BY MOUTH AT BEDTIME 12/23/15   [name], MD  doxazosin (CARDURA) 4 MG tablet TAKE ONE TABLET BY MOUTH AT BEDTIME 07/04/16   [name], MD  fosinopril (MONOPRIL) 20 MG tablet TAKE ONE TABLET BY MOUTH ONCE DAILY 08/02/15   [name], MD  LORazepam (ATIVAN) 0.5 MG tablet Take 2 tablets (1 mg total) by mouth daily as needed for anxiety (agitation). 08/20/15   Tanna Furry, MD  Multiple Vitamin (MULTIVITAMIN WITH MINERALS) TABS tablet Take 1 tablet by mouth daily.    [provider]  orphenadrine (NORFLEX) 100 MG tablet Take  1 tablet (100 mg total) by mouth 2 (two) times daily. 12/09/14   Charlesetta Shanks, MD    Family History Family History  Problem Relation Age of Onset  . Family history unknown: Yes    Social History Social History  Substance Use Topics  . Smoking status: Former Smoker    Packs/day: 2.00    Years: 30.00    Types: Cigarettes    Quit date: 02/28/1983  . Smokeless tobacco: Never Used  . Alcohol use No     Allergies   Penicillins   Review of Systems Review of Systems  Constitutional:       Per HPI,  otherwise negative  HENT:       Per HPI, otherwise negative  Respiratory:       Per HPI, otherwise negative  Cardiovascular:       Per HPI, otherwise negative  Gastrointestinal: Positive for nausea. Negative for vomiting.  Endocrine:       Negative aside from HPI  Genitourinary:       Neg aside from HPI   Musculoskeletal:       Per HPI, otherwise negative  Skin: Negative.   Neurological: Positive for speech difficulty and weakness. Negative for syncope.     Physical Exam Updated Vital Signs BP 111/61 (BP Location: Right Arm)   Pulse 68   Temp 97.7 F (36.5 C) (Oral)   Resp (!) 21   SpO2 99%   Physical Exam  Constitutional: He is oriented to person, place, and time. He has a sickly appearance. No distress.  HENT:  Head: Normocephalic and atraumatic.  Eyes: Conjunctivae and EOM are normal.  Cardiovascular: Normal rate and regular rhythm.   Pulmonary/Chest: Effort normal. No stridor. No respiratory distress.  Abdominal: He exhibits no distension.  Musculoskeletal: He exhibits no edema.  Neurological: He is alert and oriented to person, place, and time. He displays atrophy. He displays no tremor. A cranial nerve deficit is present. No sensory deficit. He exhibits normal muscle tone. He displays no seizure activity. Coordination normal.  Speech is garbled, but intelligible. Patient notes that this is typical for him.   Skin: Skin is warm and dry.  Psychiatric: He has a normal mood and affect.  Nursing note and vitals reviewed.    ED Treatments / Results  Labs (all labs ordered are listed, but only abnormal results are displayed) Labs Reviewed  PROTIME-INR - Abnormal; Notable for the following:       Result Value   Prothrombin Time 15.7 (*)    All other components within normal limits  CBC - Abnormal; Notable for the following:    WBC 3.9 (*)    RBC 3.47 (*)    Hemoglobin 10.7 (*)    HCT 31.4 (*)    RDW 16.0 (*)    All other components within normal limits    COMPREHENSIVE METABOLIC PANEL - Abnormal; Notable for the following:    Chloride 113 (*)    CO2 18 (*)    BUN 28 (*)    Creatinine, Ser 1.74 (*)    Calcium 8.8 (*)    Total Protein 6.1 (*)    Albumin 3.4 (*)    ALT 12 (*)    GFR calc non Af Amer 31 (*)    GFR calc Af Amer 36 (*)    All other components within normal limits  I-STAT CHEM 8, ED - Abnormal; Notable for the following:    BUN 29 (*)    Creatinine, Ser 1.80 (*)  Calcium, Ion 1.10 (*)    Hemoglobin 10.5 (*)    HCT 31.0 (*)    All other components within normal limits  ETHANOL  APTT  DIFFERENTIAL  RAPID URINE DRUG SCREEN, HOSP PERFORMED  URINALYSIS, ROUTINE W REFLEX MICROSCOPIC  I-STAT TROPOININ, ED    EKG  EKG Interpretation  Date/Time:  Friday Jul 14 2016 11:54:56 EDT Ventricular Rate:  67 PR Interval:    QRS Duration: 116 QT Interval:  421 QTC Calculation: 445 R Axis:   -64 Text Interpretation:  Sinus rhythm Borderline prolonged PR interval Incomplete RBBB and LAFB Probable anteroseptal infarct, old Nonspecific T abnormalities, lateral leads Abnormal ekg Confirmed by Carmin Muskrat [phone]) on 07/14/2016 1:49:32 PM       Radiology Ct Head Code Stroke W/o Cm  Result Date: 07/14/2016 CLINICAL DATA:  Code stroke.  Nausea. EXAM: CT HEAD WITHOUT CONTRAST TECHNIQUE: Contiguous axial images were obtained from the base of the skull through the vertex without intravenous contrast. COMPARISON:  08/20/2015 FINDINGS: Brain: No evidence of acute infarction, hemorrhage, hydrocephalus, extra-axial collection or mass lesion/mass effect. Large remote infarct in the left frontal parietal region affecting white matter and cortex, upper division MCA territory. There is diffuse chronic microvascular ischemic change in the cerebral white matter. Small remote bilateral cerebellar infarcts. Cerebral volume loss in keeping with age. Vascular: Atherosclerotic calcification.  No hyperdense vessel. Skull: No acute or aggressive finding  Sinuses/Orbits: Bilateral cataract resection. Other: Text page with results sent on 07/14/2016 at 12:01 pm to Dr. Shon Hale. ASPECTS [facility] Stroke Program Early CT Score) Not scored with the provided history IMPRESSION: 1. No acute finding. 2. Large remote left frontal parietal infarct. 3. Diffuse chronic small vessel ischemia. Electronically Signed   By: Monte Fantasia M.D.   On: 07/14/2016 12:03    Procedures Procedures (including critical care time)  Medications Ordered in ED Medications  ondansetron (ZOFRAN) 4 MG/2ML injection (not administered)  ondansetron (ZOFRAN) injection 4 mg (4 mg Intravenous Given 07/14/16 1149)     Initial Impression / Assessment and Plan / ED Course  I have reviewed the triage vital signs and the nursing notes.  Pertinent labs & imaging results that were available during my care of the patient were reviewed by me and considered in my medical decision making (see chart for details).  Was called to bedside and patient had an episode of nausea, lightheadedness, slowing. Patient was then be bradycardic, with heart rate in the 30s, blood pressure dropped. With positioning, additional fluids, the patient had return to the 60s.   Update: The patient's wife is now present. She corroborates the patient was well until the event today, notes that his generally well, takes no medications directed. Medication list reviewed, notable for alpha-1 blocker, not taken today, otherwise no beta blocker medication.  This elderly male presents after an episode of worsening slurred speech. Patient does have slurred speech at baseline, but given the description of episode of more pronounced symptoms, as well as the episode here with lightheadedness, nausea, which was observed, and noted to have concurrent bradycardia, there suspicion for cardiogenic etiology for his change, as his return to normal heart rate resulted in return to baseline interactivity. Patient has been evaluated by  our neurology colleagues as well, he is not a code stroke. Patient still required admission for further evaluation and management.  Final Clinical Impressions(s) / ED Diagnoses  Dysarthria Bradycardia   Carmin Muskrat, MD 07/14/16 [place]    Carmin Muskrat, MD 07/14/16 1349

## 2016-07-14 NOTE — Progress Notes (Signed)
CRITICAL VALUE ALERT  Critical value received:  Troponin 0.06  Date of notification:  07/14/16  Time of notification:  9:54 PM   Critical value read back:Yes.    Nurse who received alert:  Doree Albee  MD notified (1st page):  [facility]: [place]  Time of first page:  9:55pm  No further orders at this time. Will continue to monitor.

## 2016-07-14 NOTE — H&P (Signed)
History and Physical    Howard Padilla TDD:[id number] DOB: 09/06/1915 DOA: 07/14/2016  PCP: [name], MD  Patient coming from: Home  I have personally briefly reviewed patient's old medical records in [place]  Chief Complaint: Dysarthria with associated episode of bradycardia which occurred today  HPI: Howard Padilla is a 83 y.o. male with medical history significant of chronic dysarthria and stuttering. Today however his stuttering appeared to be worse than usual. His wife became concerned and activated EMS. When EMS arrived paramedics noted that his blood pressures were low. He had an episode of listlessness and then was nauseated. His heart rate was noted to be in the mid 30s. The patient takes no medications.  Due to symptoms of possible CVA a code stroke was called. On arrival to the emergency department patient was evaluated by the neurologist who did not feel the patient was undergoing a code stroke. He is referred to the ER physician for routine care of his nausea and other symptoms. Given the episode of bradycardia and his hypotension he is referred to me for further evaluation and management. Upon my evaluation of the patient he seemed to be very anxious and became tearful at one point. He stated that he wants to die and doesn't want to have any pain.   Review of Systems: As per HPI otherwise 10 point review of systems negative.  Unacceptable ROS statements: "10 systems reviewed," "Extensive" (without elaboration).  Acceptable ROS statements: "All others negative," "All others reviewed and are negative," and "All others unremarkable," with at [place] documented Can't double dip - if using for HPI can't use for ROS Review of Systems  Constitutional: Negative for chills, fever and weight loss.  HENT: Negative for hearing loss and tinnitus.   Eyes: Negative for blurred vision and double vision.  Respiratory: Negative for cough and hemoptysis.     Cardiovascular: Negative for chest pain and palpitations.  Gastrointestinal: Positive for nausea. Negative for heartburn and vomiting.  Genitourinary: Positive for urgency. Negative for dysuria.  Musculoskeletal: Negative for myalgias and neck pain.  Skin: Negative for itching and rash.  Neurological: Positive for speech change. Negative for focal weakness and seizures.  Psychiatric/Behavioral: Negative for depression and hallucinations.   Past Medical History:  Diagnosis Date  . BPH (benign prostatic hypertrophy)   . Cancer ([place])    colon  . Dementia   . DVT (deep venous thrombosis) ([place])    with pulmonary embo.  Marland Kitchen Hyperlipidemia   . Hypertension     Past Surgical History:  Procedure Laterality Date  . COLON SURGERY     partial colectomy  . HERNIA REPAIR     ingunial     reports that he quit smoking about 33 years ago. His smoking use included Cigarettes. He has a 60.00 pack-year smoking history. He has never used smokeless tobacco. He reports that he does not drink alcohol or use drugs.  Allergies  Allergen Reactions  . Penicillins Rash    Has patient had a PCN reaction causing immediate rash, facial/tongue/throat swelling, SOB or lightheadedness with hypotension: No Has patient had a PCN reaction causing severe rash involving mucus membranes or skin necrosis: No Has patient had a PCN reaction that required hospitalization No Has patient had a PCN reaction occurring within the last 10 years: No If all of the above answers are "NO", then may proceed with Cephalosporin use.     Family History  Problem Relation Age of Onset  . Family history  unknown: Yes   Unacceptable: Noncontributory, unremarkable, or negative. Acceptable: Family history reviewed and not pertinent (If you reviewed it)  Prior to Admission medications   Medication Sig Start Date End Date Taking? Authorizing Provider  acetaminophen (TYLENOL) 500 MG tablet Take 2 tablets (1,000 mg total) by mouth  every 6 (six) hours as needed. Patient taking differently: Take 1,000 mg by mouth every 6 (six) hours as needed for moderate pain or headache.  12/09/14   Charlesetta Shanks, MD  aspirin 325 MG tablet Take 1 tablet (325 mg total) by mouth daily. 01/08/15   Kelvin Cellar, MD  doxazosin (CARDURA) 4 MG tablet TAKE ONE TABLET BY MOUTH AT BEDTIME 08/27/14   [name], MD  doxazosin (CARDURA) 4 MG tablet TAKE ONE TABLET BY MOUTH AT BEDTIME 12/23/15   [name], MD  doxazosin (CARDURA) 4 MG tablet TAKE ONE TABLET BY MOUTH AT BEDTIME 07/04/16   [name], MD  fosinopril (MONOPRIL) 20 MG tablet TAKE ONE TABLET BY MOUTH ONCE DAILY 08/02/15   [name], MD  LORazepam (ATIVAN) 0.5 MG tablet Take 2 tablets (1 mg total) by mouth daily as needed for anxiety (agitation). 08/20/15   Tanna Furry, MD  Multiple Vitamin (MULTIVITAMIN WITH MINERALS) TABS tablet Take 1 tablet by mouth daily.    [provider]  orphenadrine (NORFLEX) 100 MG tablet Take 1 tablet (100 mg total) by mouth 2 (two) times daily. 12/09/14   Charlesetta Shanks, MD    Physical Exam: Vitals:   07/14/16 1500 07/14/16 1512 07/14/16 1516 07/14/16 1531  BP:    (!) 192/95  Pulse:  81 (!) 103 (!) 101  Resp: (!) 24 14 (!) 21 (!) 21  Temp:    97.8 F (36.6 C)  TempSrc:    Oral  SpO2:  97% 94% 99%    Constitutional: NAD, calm, comfortable, elderly gentleman Vitals:   07/14/16 1500 07/14/16 1512 07/14/16 1516 07/14/16 1531  BP:    (!) 192/95  Pulse:  81 (!) 103 (!) 101  Resp: (!) 24 14 (!) 21 (!) 21  Temp:    97.8 F (36.6 C)  TempSrc:    Oral  SpO2:  97% 94% 99%   Eyes: PERRL, lids and conjunctivae normal ENMT: Mucous membranes are moist. Posterior pharynx clear of any exudate or lesions.Normal dentition.  Neck: normal, supple, no masses, no thyromegaly Respiratory: clear to auscultation bilaterally, no wheezing, no crackles. Normal respiratory effort. No accessory muscle use.  Cardiovascular:  Regular rate and rhythm, no murmurs / rubs / gallops. No extremity edema. 2+ pedal pulses. No carotid bruits.  Abdomen: no tenderness, no masses palpated. No hepatosplenomegaly. Bowel sounds positive.  Musculoskeletal: no clubbing / cyanosis. No joint deformity upper and lower extremities. Good ROM, no contractures. Normal muscle tone.  Skin: no rashes, lesions, ulcers. No induration Neurologic: CN 2-12 grossly intact. Sensation intact, DTR normal. Strength 5/5 in all 4.  Psychiatric: Normal judgment and insight. Alert and oriented x 3. Labile mood.    Labs on Admission: I have personally reviewed following labs and imaging studies  CBC:  Recent Labs Lab 07/14/16 1105 07/14/16 1131  WBC 3.9*  --   NEUTROABS 1.9  --   HGB 10.7* 10.5*  HCT 31.4* 31.0*  MCV 90.5  --   PLT 155  --    Basic Metabolic Panel:  Recent Labs Lab 07/14/16 1105 07/14/16 1131  NA 140 141  K 4.1 4.1  CL 113* 111  CO2 18*  --  GLUCOSE 98 93  BUN 28* 29*  CREATININE 1.74* 1.80*  CALCIUM 8.8*  --    GFR: CrCl cannot be calculated (Unknown ideal weight.). Liver Function Tests:  Recent Labs Lab 07/14/16 1105  AST 22  ALT 12*  ALKPHOS 73  BILITOT 0.9  PROT 6.1*  ALBUMIN 3.4*   No results for input(s): LIPASE, AMYLASE in the last 168 hours. No results for input(s): AMMONIA in the last 168 hours. Coagulation Profile:  Recent Labs Lab 07/14/16 1105  INR 1.25   Cardiac Enzymes:  Troponin 0.02 BNP (last 3 results) No results for input(s): PROBNP in the last 8760 hours. HbA1C: No results for input(s): HGBA1C in the last 72 hours. CBG: No results for input(s): GLUCAP in the last 168 hours. Lipid Profile: No results for input(s): CHOL, HDL, LDLCALC, TRIG, CHOLHDL, LDLDIRECT in the last 72 hours. Thyroid Function Tests: No results for input(s): TSH, T4TOTAL, FREET4, T3FREE, THYROIDAB in the last 72 hours. Anemia Panel: No results for input(s): VITAMINB12, FOLATE, FERRITIN, TIBC, IRON,  RETICCTPCT in the last 72 hours. Urine analysis:    Component Value Date/Time   COLORURINE YELLOW 07/14/2016 1331   APPEARANCEUR CLEAR 07/14/2016 1331   LABSPEC 1.014 07/14/2016 1331   PHURINE 5.0 07/14/2016 1331   GLUCOSEU NEGATIVE 07/14/2016 1331   HGBUR SMALL (A) 07/14/2016 1331   BILIRUBINUR NEGATIVE 07/14/2016 1331   KETONESUR 5 (A) 07/14/2016 1331   PROTEINUR NEGATIVE 07/14/2016 1331   UROBILINOGEN 0.2 01/06/2015 0927   NITRITE NEGATIVE 07/14/2016 1331   LEUKOCYTESUR NEGATIVE 07/14/2016 1331    Radiological Exams on Admission: Ct Head Code Stroke W/o Cm  Result Date: 07/14/2016 CLINICAL DATA:  Code stroke.  Nausea. EXAM: CT HEAD WITHOUT CONTRAST TECHNIQUE: Contiguous axial images were obtained from the base of the skull through the vertex without intravenous contrast. COMPARISON:  08/20/2015 FINDINGS: Brain: No evidence of acute infarction, hemorrhage, hydrocephalus, extra-axial collection or mass lesion/mass effect. Large remote infarct in the left frontal parietal region affecting white matter and cortex, upper division MCA territory. There is diffuse chronic microvascular ischemic change in the cerebral white matter. Small remote bilateral cerebellar infarcts. Cerebral volume loss in keeping with age. Vascular: Atherosclerotic calcification.  No hyperdense vessel. Skull: No acute or aggressive finding Sinuses/Orbits: Bilateral cataract resection. Other: Text page with results sent on 07/14/2016 at 12:01 pm to Dr. Shon Hale. ASPECTS [facility] Stroke Program Early CT Score) Not scored with the provided history IMPRESSION: 1. No acute finding. 2. Large remote left frontal parietal infarct. 3. Diffuse chronic small vessel ischemia. Electronically Signed   By: Monte Fantasia M.D.   On: 07/14/2016 12:03    EKG: Independently reviewed. Sinus rhythm at 67 bpm.  Assessment/Plan Principal Problem:   Bradycardia Active Problems:   BPH (benign prostatic hyperplasia)   Slurred speech    1.  Bradycardia: We'll monitor the patient overnight on telemetry. Given that his episode was associated with hypotension will also check cardiac enzymes. I have not scheduled a stress test as I do not believe that a stress test in this 83 year old gentleman with be of benefit. He seems to be communicating that he does not wish to have a lot of aggressive testing done. Family members are not here at this point to discuss this with them therefore will  2. Slurred speech: Patient has a long history of slurred speech likely related to his previous CVA. He does have some mild dysarthria which was worse earlier today and has since improved. Hypotension and bradycardia may have worsened  his already poor speech. He also has a chronic stutter which has been present lifelong. Upon arrival of his wife to the emergency department she noted that his speech was back to its usual quality. We'll continue to monitor.  3. Benign prostatic hypertrophy we'll continue home medication.  DVT prophylaxis: Lovenox Code Status: Full code Family Communication: Patient's wife had left before I arrived to the room. Disposition Plan: Home Consults called: None Admission status: Observation   Lady Deutscher MD FACP [facility] Pager [phone]  If 7PM-7AM, please contact night-coverage [email or web address] Password [facility]  07/14/2016, 4:45 PM

## 2016-07-14 NOTE — Progress Notes (Signed)
Patient admitted to 2W18 from ED, A&Ox4. Vital signs obtained, tele applied, and CCMD notified. Patient oriented to room and call light placed within reach. Patient denies any needs at this time.

## 2016-07-14 NOTE — Code Documentation (Addendum)
83yo male presenting to the [facility] via [facility] at 31.  Patient from home with dysarthria with LKW 1015 per EMS.  Of note, EMS assessed SBP to be in the 60s on palpation.  Stroke team at the bedside on patient arrival.  Code stroke canceled.  Patient to CT with team.  CT completed.  Patient to D31 with team.  NIHSS 2, see documentation for details and code stroke times.  Patient unable to state the correct month and has difficulty naming and describing pictures on the NIHSS cards on exam.  Patient with h/o dementia.  Patient vomited during exam.  Dr. Shon Hale at the bedside for exam.  No acute stroke treatment at this time.  Bedside handoff with ED RN Carmelina Paddock.

## 2016-07-15 DIAGNOSIS — I959 Hypotension, unspecified: Secondary | ICD-10-CM | POA: Diagnosis not present

## 2016-07-15 DIAGNOSIS — Z85038 Personal history of other malignant neoplasm of large intestine: Secondary | ICD-10-CM | POA: Diagnosis not present

## 2016-07-15 DIAGNOSIS — F0390 Unspecified dementia without behavioral disturbance: Secondary | ICD-10-CM | POA: Diagnosis not present

## 2016-07-15 DIAGNOSIS — R001 Bradycardia, unspecified: Secondary | ICD-10-CM | POA: Diagnosis not present

## 2016-07-15 DIAGNOSIS — I739 Peripheral vascular disease, unspecified: Secondary | ICD-10-CM | POA: Diagnosis not present

## 2016-07-15 DIAGNOSIS — R4781 Slurred speech: Secondary | ICD-10-CM

## 2016-07-15 DIAGNOSIS — N400 Enlarged prostate without lower urinary tract symptoms: Secondary | ICD-10-CM

## 2016-07-15 DIAGNOSIS — Z86718 Personal history of other venous thrombosis and embolism: Secondary | ICD-10-CM | POA: Diagnosis not present

## 2016-07-15 DIAGNOSIS — E785 Hyperlipidemia, unspecified: Secondary | ICD-10-CM | POA: Diagnosis not present

## 2016-07-15 DIAGNOSIS — N183 Chronic kidney disease, stage 3 (moderate): Secondary | ICD-10-CM | POA: Diagnosis not present

## 2016-07-15 DIAGNOSIS — R112 Nausea with vomiting, unspecified: Secondary | ICD-10-CM | POA: Diagnosis not present

## 2016-07-15 DIAGNOSIS — N401 Enlarged prostate with lower urinary tract symptoms: Secondary | ICD-10-CM | POA: Diagnosis not present

## 2016-07-15 DIAGNOSIS — I69320 Aphasia following cerebral infarction: Secondary | ICD-10-CM | POA: Diagnosis not present

## 2016-07-15 DIAGNOSIS — R29702 NIHSS score 2: Secondary | ICD-10-CM | POA: Diagnosis not present

## 2016-07-15 LAB — BASIC METABOLIC PANEL
ANION GAP: 7 (ref 5–15)
BUN: 31 mg/dL — ABNORMAL HIGH (ref 6–20)
CALCIUM: 8.4 mg/dL — AB (ref 8.9–10.3)
CO2: 21 mmol/L — AB (ref 22–32)
Chloride: 112 mmol/L — ABNORMAL HIGH (ref 101–111)
Creatinine, Ser: 1.6 mg/dL — ABNORMAL HIGH (ref 0.61–1.24)
GFR calc Af Amer: 39 mL/min — ABNORMAL LOW (ref >60)
GFR calc non Af Amer: 34 mL/min — ABNORMAL LOW (ref >60)
Glucose, Bld: 95 mg/dL (ref 65–99)
POTASSIUM: 4.6 mmol/L (ref 3.5–5.1)
Sodium: 140 mmol/L (ref 135–145)

## 2016-07-15 LAB — TROPONIN I: Troponin I: 0.09 ng/mL (ref <0.03)

## 2016-07-15 MED ORDER — ENOXAPARIN SODIUM 30 MG/0.3ML ~~LOC~~ SOLN
30.0000 mg | SUBCUTANEOUS | Status: DC
Start: 2016-07-15 — End: 2016-07-15

## 2016-07-15 NOTE — Progress Notes (Signed)
07/15/2016 1455 Discharge AVS meds taken today and those due this evening reviewed.  Follow-up appointments and when to call md reviewed.  D/C IV and TELE.  Questions and concerns addressed.   D/C home per orders. [place]

## 2016-07-15 NOTE — Discharge Summary (Signed)
Physician Discharge Summary  Howard Padilla JSH:[id number] DOB: May 26, 1915 DOA: 07/14/2016  PCP: [name], MD  Admit date: 07/14/2016 Discharge date: 07/15/2016  Admitted From: Home Disposition: Home   Recommendations for Outpatient Follow-up:  1. Follow up with PCP in the next week 2. Recommend rechecking ECG if bradycardic.   Home Health: None Equipment/Devices: None Discharge Condition: Stable CODE STATUS: Full Diet recommendation: Heart healthy  Brief/Interim Summary: Howard Padilla is a 83 y.o. male with a history of CVA and chronic dysarthria brought by EMS when wife reported worsened slurring of speech. On arrival, BP was low and HR was reportedly in 30's. On arrival to the emergency department patient was evaluated by the neurologist who did not feel the patient was undergoing a code stroke. He is referred to the ER physician for routine care of his nausea and other symptoms. Given the episode of bradycardia and his hypotension, he was placed in observation with telemetry. His symptoms resolved without intervention. Repeat ECG showed NSR with preexisting LAFB. Troponins were checked and modestly elevated up to 0.09 without chest pain or dyspnea. Case discussed with cardiology, who recommended no further evaluation.   Discharge Diagnoses:  Principal Problem:   Bradycardia Active Problems:   BPH (benign prostatic hyperplasia)   Slurred speech  Sinus bradycardia and mild troponin elevation: Maintained sinus rhythm on telemetry without prolonged pauses. Suspect demand ischemia.  - He seems to be communicating that he does not wish to have a lot of aggressive testing done and per cardiology, no intervention is indicated.  Slurred speech/history of CVA: Patient has a long history of slurred speech likely related to his previous CVA. Resolved without intervention. Upon arrival of his wife to the emergency department she noted that his speech was back to its usual  quality. - Continue aspirin  BPH: No urinary retention.  - Continue doxazosin, if hypotension/bradycardia recur, consider alternative treatment.  CKD stage III: Creatinine at/near baseline 1.74 > 1.60.  - Continue fosinopril  Discharge Instructions Discharge Instructions    Diet - low sodium heart healthy    Complete by:  As directed    Discharge instructions    Complete by:  As directed    You were admitted for slurred speech which has resolved. There was no evidence of stroke on imaging. You are stable for discharge, but should call Dr. [name] for follow up in the next week. If your symptoms recur, seek medical attention.   Increase activity slowly    Complete by:  As directed      Allergies as of 07/15/2016      Reactions   Penicillins Rash, Other (See Comments)   Has patient had a PCN reaction causing immediate rash, facial/tongue/throat swelling, SOB or lightheadedness with hypotension: No Has patient had a PCN reaction causing severe rash involving mucus membranes or skin necrosis: No Has patient had a PCN reaction that required hospitalization No Has patient had a PCN reaction occurring within the last 10 years: No If all of the above answers are "NO", then may proceed with Cephalosporin use.      Medication List    TAKE these medications   acetaminophen 500 MG tablet Commonly known as:  TYLENOL Take 2 tablets (1,000 mg total) by mouth every 6 (six) hours as needed. What changed:  reasons to take this   aspirin 325 MG tablet Take 1 tablet (325 mg total) by mouth daily.   doxazosin 4 MG tablet Commonly known as:  CARDURA TAKE ONE TABLET BY  MOUTH AT BEDTIME   fosinopril 20 MG tablet Commonly known as:  MONOPRIL TAKE ONE TABLET BY MOUTH ONCE DAILY   LORazepam 0.5 MG tablet Commonly known as:  ATIVAN Take 2 tablets (1 mg total) by mouth daily as needed for anxiety (agitation).   multivitamin with minerals Tabs tablet Take 1 tablet by mouth daily.   orphenadrine  100 MG tablet Commonly known as:  NORFLEX Take 1 tablet (100 mg total) by mouth 2 (two) times daily.   vitamin C 500 MG tablet Commonly known as:  ASCORBIC ACID Take 500 mg by mouth daily.      Follow-up Information    [name], MD. Schedule an appointment as soon as possible for a visit in 2 day(s).   Specialty:  Internal Medicine Contact information: [place] [address] [phone]          Allergies  Allergen Reactions  . Penicillins Rash and Other (See Comments)    Has patient had a PCN reaction causing immediate rash, facial/tongue/throat swelling, SOB or lightheadedness with hypotension: No Has patient had a PCN reaction causing severe rash involving mucus membranes or skin necrosis: No Has patient had a PCN reaction that required hospitalization No Has patient had a PCN reaction occurring within the last 10 years: No If all of the above answers are "NO", then may proceed with Cephalosporin use.     Consultations:  Discussed with cardiology, Dr. Sallyanne Kuster. Did not see the patient.   Procedures/Studies: Ct Head Code Stroke W/o Cm  Result Date: 07/14/2016 CLINICAL DATA:  Code stroke.  Nausea. EXAM: CT HEAD WITHOUT CONTRAST TECHNIQUE: Contiguous axial images were obtained from the base of the skull through the vertex without intravenous contrast. COMPARISON:  08/20/2015 FINDINGS: Brain: No evidence of acute infarction, hemorrhage, hydrocephalus, extra-axial collection or mass lesion/mass effect. Large remote infarct in the left frontal parietal region affecting white matter and cortex, upper division MCA territory. There is diffuse chronic microvascular ischemic change in the cerebral white matter. Small remote bilateral cerebellar infarcts. Cerebral volume loss in keeping with age. Vascular: Atherosclerotic calcification.  No hyperdense vessel. Skull: No acute or aggressive finding Sinuses/Orbits: Bilateral cataract resection. Other:  Text page with results sent on 07/14/2016 at 12:01 pm to Dr. Shon Hale. ASPECTS [facility] Stroke Program Early CT Score) Not scored with the provided history IMPRESSION: 1. No acute finding. 2. Large remote left frontal parietal infarct. 3. Diffuse chronic small vessel ischemia. Electronically Signed   By: Monte Fantasia M.D.   On: 07/14/2016 12:03   Subjective: Patient without complaints. No dyspnea, shortness of breath, palpitations, chest pain. Per pt and wife, speech back to baseline.   Discharge Exam: BP 128/74 (BP Location: Left Arm)   Pulse 75   Temp 97.7 F (36.5 C) (Oral)   Resp 16   Ht 5\' 7"  (1.702 m)   Wt 62.5 kg (137 lb 11.2 oz)   SpO2 98%   BMI 21.57 kg/m   General: Pleasant, elderly male in no distress Cardiovascular: RRR, no murmur, no JVD Respiratory: Nonlabored, clear Abdominal: Soft, NT, ND, bowel sounds + Extremities: No edema, no cyanosis  Labs: Basic Metabolic Panel:  Recent Labs Lab 07/14/16 1105 07/14/16 1131 07/15/16 0214  NA 140 141 140  K 4.1 4.1 4.6  CL 113* 111 112*  CO2 18*  --  21*  GLUCOSE 98 93 95  BUN 28* 29* 31*  CREATININE 1.74* 1.80* 1.60*  CALCIUM 8.8*  --  8.4*   Liver  Function Tests:  Recent Labs Lab 07/14/16 1105  AST 22  ALT 12*  ALKPHOS 73  BILITOT 0.9  PROT 6.1*  ALBUMIN 3.4*   CBC:  Recent Labs Lab 07/14/16 1105 07/14/16 1131  WBC 3.9*  --   NEUTROABS 1.9  --   HGB 10.7* 10.5*  HCT 31.4* 31.0*  MCV 90.5  --   PLT 155  --    Cardiac Enzymes:  Recent Labs Lab 07/14/16 1600 07/14/16 2024 07/15/16 0214  TROPONINI <0.03 0.06* 0.09*   Urinalysis    Component Value Date/Time   COLORURINE YELLOW 07/14/2016 1331   APPEARANCEUR CLEAR 07/14/2016 1331   LABSPEC 1.014 07/14/2016 1331   PHURINE 5.0 07/14/2016 1331   GLUCOSEU NEGATIVE 07/14/2016 1331   HGBUR SMALL (A) 07/14/2016 1331   BILIRUBINUR NEGATIVE 07/14/2016 1331   KETONESUR 5 (A) 07/14/2016 1331   PROTEINUR NEGATIVE 07/14/2016 1331   UROBILINOGEN  0.2 01/06/2015 0927   NITRITE NEGATIVE 07/14/2016 1331   LEUKOCYTESUR NEGATIVE 07/14/2016 1331   Time coordinating discharge: Approximately 40 minutes  Vance Gather, MD  [facility] 07/15/2016, 12:49 PM Pager [phone]

## 2016-07-15 NOTE — Care Management Note (Signed)
Case Management Note  Patient Details  Name: Howard Padilla MRN: [id number] Date of Birth: [birth year]  Subjective/Objective:                 Patient in observation with order to DC to home. Chart reviewed. No CM consults, orders, or needs identified at this time.    Action/Plan:  DC to home.   Expected Discharge Date:  07/15/16               Expected Discharge Plan:  Home/Self Care  In-House Referral:     Discharge planning Services  CM Consult  Post Acute Care Choice:    Choice offered to:     DME Arranged:    DME Agency:     HH Arranged:    HH Agency:     Status of Service:  Completed, signed off  If discussed at [employer] of Stay Meetings, dates discussed:    Additional Comments:  Carles Collet, RN 07/15/2016, 2:19 PM

## 2016-08-01 ENCOUNTER — Other Ambulatory Visit: Payer: Self-pay | Admitting: Internal Medicine

## 2016-08-01 ENCOUNTER — Telehealth: Payer: Self-pay | Admitting: Internal Medicine

## 2016-08-01 MED ORDER — DOXAZOSIN MESYLATE 4 MG PO TABS: 4.0000 mg | ORAL_TABLET | Freq: Every day | ORAL | 0 refills | Status: DC

## 2016-08-01 NOTE — Telephone Encounter (Signed)
° ° ° °  Pt is out of the below med and has an appt next week and only need a few pills till he see Dr [name]    Pharmacy [facility]

## 2016-08-07 ENCOUNTER — Ambulatory Visit: Payer: Medicare Other | Admitting: Internal Medicine

## 2016-08-10 ENCOUNTER — Encounter: Payer: Self-pay | Admitting: Internal Medicine

## 2016-08-10 ENCOUNTER — Ambulatory Visit (INDEPENDENT_AMBULATORY_CARE_PROVIDER_SITE_OTHER): Payer: Medicare Other | Admitting: Internal Medicine

## 2016-08-10 VITALS — BP 146/68 | HR 69 | Temp 98.5°F | Ht 67.0 in | Wt 137.0 lb

## 2016-08-10 DIAGNOSIS — R001 Bradycardia, unspecified: Secondary | ICD-10-CM

## 2016-08-10 DIAGNOSIS — N183 Chronic kidney disease, stage 3 unspecified: Secondary | ICD-10-CM

## 2016-08-10 DIAGNOSIS — R4701 Aphasia: Secondary | ICD-10-CM | POA: Diagnosis not present

## 2016-08-10 MED ORDER — FOSINOPRIL SODIUM 20 MG PO TABS: 20.0000 mg | ORAL_TABLET | Freq: Every day | ORAL | 3 refills | Status: DC

## 2016-08-10 NOTE — Patient Instructions (Addendum)
WE NOW OFFER   [facility] FAST TRACK!!!  SAME DAY Appointments for ACUTE CARE  Such as: Sprains, Injuries, cuts, abrasions, rashes, muscle pain, joint pain, back pain Colds, flu, sore throats, headache, allergies, cough, fever  Ear pain, sinus and eye infections Abdominal pain, nausea, vomiting, diarrhea, upset stomach Animal/insect bites  3 Easy Ways to Schedule: Walk-In Scheduling Call in scheduling Mychart Sign-up: [email or web address]   Limit your sodium (Salt) intake  Return in 3-4 months for follow-up

## 2016-08-10 NOTE — Progress Notes (Signed)
Subjective:    Patient ID: Howard Padilla, male    DOB: [birth year], 83 y.o.   MRN: [id number]  HPI  You should is seen here approximately 8 weeks ago.  Since that time.  He has had 2 ED visits and one brief hospital admission.  He has had some issues with dysphagia as well as some significant bradycardia.  Since his discharge he has done well.  He is a comfortable by his wife today.  Patient does have some mild dementia.  He has essential hypertension and history of prior stroke.  He has remote history of colon cancer Hospital records reviewed  Past Medical History:  Diagnosis Date  . BPH (benign prostatic hypertrophy)   . Cancer ([place])    colon  . Dementia   . DVT (deep venous thrombosis) ([place])    with pulmonary embo.  Marland Kitchen Hyperlipidemia   . Hypertension      Social History   Social History  . Marital status: Married    Spouse name: N/A  . Number of children: N/A  . Years of education: N/A   Occupational History  . Not on file.   Social History Main Topics  . Smoking status: Former Smoker    Packs/day: 2.00    Years: 30.00    Types: Cigarettes    Quit date: 02/28/1983  . Smokeless tobacco: Never Used  . Alcohol use No  . Drug use: No  . Sexual activity: Not on file   Other Topics Concern  . Not on file   Social History Narrative  . No narrative on file    Past Surgical History:  Procedure Laterality Date  . COLON SURGERY     partial colectomy  . HERNIA REPAIR     ingunial    Family History  Problem Relation Age of Onset  . Family history unknown: Yes    Allergies  Allergen Reactions  . Penicillins Rash and Other (See Comments)    Has patient had a PCN reaction causing immediate rash, facial/tongue/throat swelling, SOB or lightheadedness with hypotension: No Has patient had a PCN reaction causing severe rash involving mucus membranes or skin necrosis: No Has patient had a PCN reaction that required hospitalization No Has patient had a PCN reaction  occurring within the last 10 years: No If all of the above answers are "NO", then may proceed with Cephalosporin use.     Current Outpatient Prescriptions on File Prior to Visit  Medication Sig Dispense Refill  . acetaminophen (TYLENOL) 500 MG tablet Take 2 tablets (1,000 mg total) by mouth every 6 (six) hours as needed. (Patient taking differently: Take 1,000 mg by mouth every 6 (six) hours as needed for moderate pain or headache. ) 30 tablet 0  . aspirin 325 MG tablet Take 1 tablet (325 mg total) by mouth daily. 30 tablet 1  . doxazosin (CARDURA) 4 MG tablet TAKE 1 TABLET BY MOUTH AT BEDTIME *NEEDS  OFFICE  VISIT  FOR  FURTHER  REFILLS* 30 tablet 0  . fosinopril (MONOPRIL) 20 MG tablet TAKE ONE TABLET BY MOUTH ONCE DAILY 90 tablet 3  . Multiple Vitamin (MULTIVITAMIN WITH MINERALS) TABS tablet Take 1 tablet by mouth daily.    . vitamin C (ASCORBIC ACID) 500 MG tablet Take 500 mg by mouth daily.     No current facility-administered medications on file prior to visit.     BP (!) 146/68 (BP Location: Left Arm, Patient Position: Sitting, Cuff Size: Normal)   Pulse 69  Temp 98.5 F (36.9 C) (Oral)   Ht 5\' 7"  (1.702 m)   Wt 137 lb (62.1 kg)   SpO2 98%   BMI 21.46 kg/m     Review of Systems  Constitutional: Negative for appetite change, chills, fatigue and fever.  HENT: Negative for congestion, dental problem, ear pain, hearing loss, sore throat, tinnitus, trouble swallowing and voice change.   Eyes: Negative for pain, discharge and visual disturbance.  Respiratory: Negative for cough, chest tightness, wheezing and stridor.   Cardiovascular: Negative for chest pain, palpitations and leg swelling.  Gastrointestinal: Negative for abdominal distention, abdominal pain, blood in stool, constipation, diarrhea, nausea and vomiting.  Genitourinary: Negative for difficulty urinating, discharge, flank pain, genital sores, hematuria and urgency.  Musculoskeletal: Negative for arthralgias, back  pain, gait problem, joint swelling, myalgias and neck stiffness.  Skin: Negative for rash.  Neurological: Positive for speech difficulty. Negative for dizziness, syncope, weakness, numbness and headaches.  Hematological: Negative for adenopathy. Does not bruise/bleed easily.  Psychiatric/Behavioral: Positive for confusion and decreased concentration. Negative for behavioral problems and dysphoric mood. The patient is not nervous/anxious.        Objective:   Physical Exam  Constitutional: He is oriented to person, place, and time. He appears well-developed.  Elderly, frail no distress.  Blood pressure well controlled.  Pulse in the sixties  HENT:  Head: Normocephalic.  Right Ear: External ear normal.  Left Ear: External ear normal.  Eyes: Conjunctivae and EOM are normal.  Neck: Normal range of motion.  Cardiovascular: Normal rate and normal heart sounds.   Pulmonary/Chest: Breath sounds normal.  Abdominal: Bowel sounds are normal.  Musculoskeletal: Normal range of motion. He exhibits no edema or tenderness.  Neurological: He is alert and oriented to person, place, and time.  Psychiatric: He has a normal mood and affect. His behavior is normal.          Assessment & Plan:   Essential hypertension, stable Cerebrovascular disease Mild dementia Bradycardia, resolved  No change in medical regimen Follow-up 6 months  Marvis Bakken Pilar Plate

## 2016-08-31 ENCOUNTER — Other Ambulatory Visit: Payer: Self-pay | Admitting: Internal Medicine

## 2016-08-31 MED ORDER — DOXAZOSIN MESYLATE 4 MG PO TABS: ORAL_TABLET | ORAL | 1 refills | Status: DC

## 2016-08-31 NOTE — Telephone Encounter (Signed)
Rx sent 

## 2016-08-31 NOTE — Telephone Encounter (Addendum)
Pt request refill  doxazosin (CARDURA) 4 MG tablet  [id number] - [place], [place] - [address]  Pt had appt 6/14 and forgot to ask for refill

## 2016-09-29 ENCOUNTER — Emergency Department (HOSPITAL_COMMUNITY)
Admission: EM | Admit: 2016-09-29 | Discharge: 2016-09-29 | Disposition: A | Discharge status: Home / Self Care | Payer: Medicare Other | Attending: Emergency Medicine | Admitting: Emergency Medicine

## 2016-09-29 DIAGNOSIS — Z87891 Personal history of nicotine dependence: Secondary | ICD-10-CM | POA: Insufficient documentation

## 2016-09-29 DIAGNOSIS — N189 Chronic kidney disease, unspecified: Secondary | ICD-10-CM | POA: Insufficient documentation

## 2016-09-29 DIAGNOSIS — R9431 Abnormal electrocardiogram [ECG] [EKG]: Secondary | ICD-10-CM | POA: Diagnosis not present

## 2016-09-29 DIAGNOSIS — Z79899 Other long term (current) drug therapy: Secondary | ICD-10-CM | POA: Insufficient documentation

## 2016-09-29 DIAGNOSIS — Z85038 Personal history of other malignant neoplasm of large intestine: Secondary | ICD-10-CM | POA: Insufficient documentation

## 2016-09-29 DIAGNOSIS — R112 Nausea with vomiting, unspecified: Secondary | ICD-10-CM | POA: Diagnosis not present

## 2016-09-29 DIAGNOSIS — Z7982 Long term (current) use of aspirin: Secondary | ICD-10-CM | POA: Diagnosis not present

## 2016-09-29 DIAGNOSIS — I129 Hypertensive chronic kidney disease with stage 1 through stage 4 chronic kidney disease, or unspecified chronic kidney disease: Secondary | ICD-10-CM | POA: Diagnosis not present

## 2016-09-29 LAB — CBG MONITORING, ED: Glucose-Capillary: 142 mg/dL — ABNORMAL HIGH (ref 65–99)

## 2016-09-29 LAB — URINALYSIS, ROUTINE W REFLEX MICROSCOPIC
Bacteria, UA: NONE SEEN
Bilirubin Urine: NEGATIVE
Glucose, UA: NEGATIVE mg/dL
Hgb urine dipstick: NEGATIVE
KETONES UR: NEGATIVE mg/dL
Leukocytes, UA: NEGATIVE
Nitrite: NEGATIVE
PH: 6 (ref 5.0–8.0)
Protein, ur: 30 mg/dL — AB
Specific Gravity, Urine: 1.018 (ref 1.005–1.030)
Squamous Epithelial / LPF: NONE SEEN

## 2016-09-29 LAB — CBC
HCT: 36.9 % — ABNORMAL LOW (ref 39.0–52.0)
Hemoglobin: 11.8 g/dL — ABNORMAL LOW (ref 13.0–17.0)
MCH: 29.4 pg (ref 26.0–34.0)
MCHC: 32 g/dL (ref 30.0–36.0)
MCV: 92 fL (ref 78.0–100.0)
PLATELETS: 177 10*3/uL (ref 150–400)
RBC: 4.01 MIL/uL — ABNORMAL LOW (ref 4.22–5.81)
RDW: 15.4 % (ref 11.5–15.5)
WBC: 6.5 10*3/uL (ref 4.0–10.5)

## 2016-09-29 LAB — BASIC METABOLIC PANEL
Anion gap: 11 (ref 5–15)
BUN: 28 mg/dL — AB (ref 6–20)
CO2: 21 mmol/L — ABNORMAL LOW (ref 22–32)
Calcium: 9.1 mg/dL (ref 8.9–10.3)
Chloride: 109 mmol/L (ref 101–111)
Creatinine, Ser: 1.6 mg/dL — ABNORMAL HIGH (ref 0.61–1.24)
GFR calc Af Amer: 39 mL/min — ABNORMAL LOW (ref >60)
GFR calc non Af Amer: 34 mL/min — ABNORMAL LOW (ref >60)
GLUCOSE: 148 mg/dL — AB (ref 65–99)
POTASSIUM: 4 mmol/L (ref 3.5–5.1)
SODIUM: 141 mmol/L (ref 135–145)

## 2016-09-29 LAB — HEPATIC FUNCTION PANEL
ALT: 12 U/L — ABNORMAL LOW (ref 17–63)
AST: 23 U/L (ref 15–41)
Albumin: 3.4 g/dL — ABNORMAL LOW (ref 3.5–5.0)
Alkaline Phosphatase: 79 U/L (ref 38–126)
BILIRUBIN DIRECT: 0.1 mg/dL (ref 0.1–0.5)
Indirect Bilirubin: 0.7 mg/dL (ref 0.3–0.9)
Total Bilirubin: 0.8 mg/dL (ref 0.3–1.2)
Total Protein: 6.8 g/dL (ref 6.5–8.1)

## 2016-09-29 LAB — LIPASE, BLOOD: LIPASE: 19 U/L (ref 11–51)

## 2016-09-29 LAB — TROPONIN I

## 2016-09-29 MED ORDER — SODIUM CHLORIDE 0.9 % IV BOLUS (SEPSIS)
500.0000 mL | Freq: Once | INTRAVENOUS | Status: AC
  Administered 2016-09-29: 500 mL via INTRAVENOUS

## 2016-09-29 MED ORDER — ONDANSETRON HCL 4 MG/2ML IJ SOLN
4.0000 mg | Freq: Once | INTRAMUSCULAR | Status: AC
  Administered 2016-09-29: 4 mg via INTRAVENOUS
  Filled 2016-09-29: qty 2

## 2016-09-29 MED ORDER — ONDANSETRON 4 MG PO TBDP
4.0000 mg | ORAL_TABLET | Freq: Three times a day (TID) | ORAL | 0 refills | Status: DC | PRN
Start: 2016-09-29 — End: 2017-08-25

## 2016-09-29 NOTE — ED Triage Notes (Signed)
Pt to ED by EMS from home c/o NVD since last night with generalized weakness for a couple of hours. Pt denies pain.

## 2016-09-29 NOTE — Discharge Instructions (Addendum)

## 2016-09-29 NOTE — ED Provider Notes (Signed)
TIME SEEN: 5:31 AM  CHIEF COMPLAINT: Vomiting  HPI: Patient is a 83 year old male with history of hypertension, hyperlipidemia, chronic kidney disease, previous PE and DVT, dementia, previous history of colon cancer who presents the emergency department with complaints of vomiting. Given his dementia, wife provides most of the history. She reports that he vomited yesterday afternoon and then again this morning and asked for her to call 911. No diarrhea. She reports he has had normal bowel movements without blood or melena. No known fever. He has not been complaining of any pain. He denies chest pain, abdominal pain at this time. He denies shortness of breath. Wife denies any recent cough. No recent falls, head injury. Wife reports he is at his baseline. She denies any sick contacts.  Wife reports patient lives at home with her. She states that he is able to a bili on his own and only intermittently uses a cane.  ROS: Level V caveat for dementia  PAST MEDICAL HISTORY/PAST SURGICAL HISTORY:  Past Medical History:  Diagnosis Date  . BPH (benign prostatic hypertrophy)   . Cancer ([place])    colon  . Dementia   . DVT (deep venous thrombosis) ([place])    with pulmonary embo.  Marland Kitchen Hyperlipidemia   . Hypertension     MEDICATIONS:  Prior to Admission medications   Medication Sig Start Date End Date Taking? Authorizing Provider  acetaminophen (TYLENOL) 500 MG tablet Take 2 tablets (1,000 mg total) by mouth every 6 (six) hours as needed. Patient taking differently: Take 1,000 mg by mouth every 6 (six) hours as needed for moderate pain or headache.  12/09/14   Charlesetta Shanks, MD  aspirin 325 MG tablet Take 1 tablet (325 mg total) by mouth daily. 01/08/15   Kelvin Cellar, MD  doxazosin (CARDURA) 4 MG tablet TAKE 1 TABLET BY MOUTH AT BEDTIME 08/31/16   [name], MD  fosinopril (MONOPRIL) 20 MG tablet Take 1 tablet (20 mg total) by mouth daily. 08/10/16   [name], MD  Multiple Vitamin  (MULTIVITAMIN WITH MINERALS) TABS tablet Take 1 tablet by mouth daily.    [provider]  vitamin C (ASCORBIC ACID) 500 MG tablet Take 500 mg by mouth daily.    [provider]    ALLERGIES:  Allergies  Allergen Reactions  . Penicillins Rash and Other (See Comments)    Has patient had a PCN reaction causing immediate rash, facial/tongue/throat swelling, SOB or lightheadedness with hypotension: No Has patient had a PCN reaction causing severe rash involving mucus membranes or skin necrosis: No Has patient had a PCN reaction that required hospitalization No Has patient had a PCN reaction occurring within the last 10 years: No If all of the above answers are "NO", then may proceed with Cephalosporin use.     SOCIAL HISTORY:  Social History  Substance Use Topics  . Smoking status: Former Smoker    Packs/day: 2.00    Years: 30.00    Types: Cigarettes    Quit date: 02/28/1983  . Smokeless tobacco: Never Used  . Alcohol use No    FAMILY HISTORY: Family History  Problem Relation Age of Onset  . Family history unknown: Yes    EXAM: BP (!) 146/84   Pulse 87   Temp 97.9 F (36.6 C)   Resp 16   SpO2 95%  CONSTITUTIONAL: Alert and oriented To person only, elderly, chronically ill-appearing, smiling, very pleasant, afebrile and nontoxic, appears well-hydrated HEAD: Normocephalic EYES: Conjunctivae clear, pupils appear equal,  EOMI ENT: normal nose; moist mucous membranes NECK: Supple, no meningismus, no nuchal rigidity, no LAD  CARD: RRR; S1 and S2 appreciated; no murmurs, no clicks, no rubs, no gallops RESP: Normal chest excursion without splinting or tachypnea; breath sounds clear and equal bilaterally; no wheezes, no rhonchi, no rales, no hypoxia or respiratory distress, speaking full sentences ABD/GI: Normal bowel sounds; non-distended; soft, non-tender, no rebound, no guarding, no peritoneal signs, no hepatosplenomegaly BACK:  The back appears normal and is  non-tender to palpation, there is no CVA tenderness EXT: Normal ROM in all joints; non-tender to palpation; no edema; normal capillary refill; no cyanosis, no calf tenderness or swelling    SKIN: Normal color for age and race; warm; no rash NEURO: Moves all extremities equally, reports normal sensation diffusely, cranial nerves II through XII intact, normal speech PSYCH: The patient's mood and manner are appropriate. Grooming and personal hygiene are appropriate.  MEDICAL DECISION MAKING: Patient here with 2 episodes of vomiting. Suspect possible viral illness. His abdominal exam is completely benign. He denies any chest pain or shortness of breath. He denies any diarrhea. Wife confirms this. No focal neurologic deficits on exam. No history or signs of trauma. We'll obtain labs, urine. Will give IV fluids, IV Zofran and reassess.  I have discussed with patient's wife the option of a CT scan of his abdomen and pelvis given he is vomiting and he is elderly and demented. She denies any known history of abdominal surgeries. He has no focal tenderness on serial abdominal examinations. She states that she does not feel a CT is necessary as if he had a surgical process present she would not want it treated. She states he is too old for that significant of a workup. I feel this is completely reasonable especially given repeat abdominal exams benign.  ED PROGRESS: Patient's labs are unremarkable. No leukocytosis. He has chronic kidney disease which is stable. Normal blood glucose. Normal LFTs, lipase and negative troponin. EKG shows no ischemic abnormality. We are waiting a urinalysis. He states he is feeling "much better" after IV fluids and Zofran. He is currently drinking ginger ale without difficulty. Anticipate discharge home after urinalysis.  7:30 AM  Signed out to Dr. Laverta Baltimore who will follow-up on patient's urinalysis. Anticipate discharge home. Patient and family are comfortable with this plan. Plan is to  discharge with Zofran.  I reviewed all nursing notes, vitals, pertinent previous records, EKGs, lab and urine results, imaging (as available).    EKG Interpretation  Date/Time:  Friday September 29 2016 05:19:12 EDT Ventricular Rate:  83 PR Interval:    QRS Duration: 110 QT Interval:  401 QTC Calculation: 472 R Axis:   -42 Text Interpretation:  Sinus rhythm Borderline prolonged PR interval Incomplete RBBB and LAFB Abnormal R-wave progression, early transition Probable anteroseptal infarct, old No significant change since last tracing Confirmed by Reagann Dolce, Cyril Mourning [phone]) on 09/29/2016 5:30:50 AM         Brailey Buescher, Delice Bison, DO 09/30/16 [phone]

## 2016-09-29 NOTE — ED Notes (Signed)
Pt ambulated in the hallway. After the first few steps, pt was steady on his feet. Reports feeling strong and no abnormality with his gait.

## 2016-09-29 NOTE — ED Notes (Signed)
Family at bedside. 

## 2016-09-29 NOTE — ED Notes (Signed)
Pt drinking without difficulty. Pt attempting to provide urine sample

## 2016-09-29 NOTE — ED Provider Notes (Signed)
Blood pressure (!) 155/108, pulse 79, temperature 97.9 F (36.6 C), resp. rate 15, SpO2 99 %.  Assuming care from Dr. Leonides Schanz.  In short, Howard Padilla is a 83 y.o. male with a chief complaint of Weakness .  Refer to the original H&P for additional details.  The current plan of care is to follow UA and ambulate.  07:45 AM No UTI on UA. Updated patient and family. Patient is tolerating PO and will discharge after ambulation trial in the ED.   08:15 AM Patient ambulated without difficulty. Will discharge with symptom treatment and plan for PCP follow up.   At this time, I do not feel there is any life-threatening condition present. I have reviewed and discussed all results (EKG, imaging, lab, urine as appropriate), exam findings with patient. I have reviewed nursing notes and appropriate previous records.  I feel the patient is safe to be discharged home without further emergent workup. Discussed usual and customary return precautions. Patient and family (if present) verbalize understanding and are comfortable with this plan.  Patient will follow-up with their primary care provider. If they do not have a primary care provider, information for follow-up has been provided to them. All questions have been answered.  Nanda Quinton, MD    Margette Fast, MD 09/29/16 [phone]

## 2016-11-22 DIAGNOSIS — Z23 Encounter for immunization: Secondary | ICD-10-CM | POA: Diagnosis not present

## 2017-02-25 ENCOUNTER — Other Ambulatory Visit: Payer: Self-pay | Admitting: Internal Medicine

## 2017-03-02 ENCOUNTER — Emergency Department (HOSPITAL_COMMUNITY)
Admission: EM | Admit: 2017-03-02 | Discharge: 2017-03-03 | Disposition: A | Discharge status: Home / Self Care | Payer: Medicare Other | Attending: Emergency Medicine | Admitting: Emergency Medicine

## 2017-03-02 ENCOUNTER — Other Ambulatory Visit: Payer: Self-pay

## 2017-03-02 ENCOUNTER — Encounter (HOSPITAL_COMMUNITY): Payer: Self-pay

## 2017-03-02 DIAGNOSIS — Z87891 Personal history of nicotine dependence: Secondary | ICD-10-CM | POA: Insufficient documentation

## 2017-03-02 DIAGNOSIS — I129 Hypertensive chronic kidney disease with stage 1 through stage 4 chronic kidney disease, or unspecified chronic kidney disease: Secondary | ICD-10-CM | POA: Insufficient documentation

## 2017-03-02 DIAGNOSIS — R509 Fever, unspecified: Secondary | ICD-10-CM | POA: Diagnosis not present

## 2017-03-02 DIAGNOSIS — Z8673 Personal history of transient ischemic attack (TIA), and cerebral infarction without residual deficits: Secondary | ICD-10-CM | POA: Diagnosis not present

## 2017-03-02 DIAGNOSIS — J101 Influenza due to other identified influenza virus with other respiratory manifestations: Secondary | ICD-10-CM

## 2017-03-02 DIAGNOSIS — N183 Chronic kidney disease, stage 3 (moderate): Secondary | ICD-10-CM | POA: Diagnosis not present

## 2017-03-02 DIAGNOSIS — Z86718 Personal history of other venous thrombosis and embolism: Secondary | ICD-10-CM | POA: Diagnosis not present

## 2017-03-02 DIAGNOSIS — F0390 Unspecified dementia without behavioral disturbance: Secondary | ICD-10-CM | POA: Insufficient documentation

## 2017-03-02 DIAGNOSIS — R05 Cough: Secondary | ICD-10-CM | POA: Diagnosis present

## 2017-03-02 DIAGNOSIS — E785 Hyperlipidemia, unspecified: Secondary | ICD-10-CM | POA: Insufficient documentation

## 2017-03-02 DIAGNOSIS — J111 Influenza due to unidentified influenza virus with other respiratory manifestations: Secondary | ICD-10-CM | POA: Diagnosis not present

## 2017-03-02 DIAGNOSIS — J09X2 Influenza due to identified novel influenza A virus with other respiratory manifestations: Secondary | ICD-10-CM | POA: Diagnosis not present

## 2017-03-02 NOTE — ED Notes (Signed)
Bed: YY48 Expected date: 03/02/17 Expected time: 11:39 PM Means of arrival: Ambulance Comments: EMS 84 yo male from home/fever 101-102 cough CBG 145 170/90

## 2017-03-02 NOTE — ED Triage Notes (Signed)
Pt arrived via [facility] from home with complaints of Cough and family wanted him to come to ED because someone at home has a cold and was scared "he was going to get pneumonia" 176/99 98 98% RA CBG 145

## 2017-03-03 ENCOUNTER — Emergency Department (HOSPITAL_COMMUNITY): Payer: Medicare Other

## 2017-03-03 DIAGNOSIS — R05 Cough: Secondary | ICD-10-CM | POA: Diagnosis not present

## 2017-03-03 LAB — COMPREHENSIVE METABOLIC PANEL
ALBUMIN: 3.7 g/dL (ref 3.5–5.0)
ALT: 16 U/L — AB (ref 17–63)
ANION GAP: 6 (ref 5–15)
AST: 31 U/L (ref 15–41)
Alkaline Phosphatase: 87 U/L (ref 38–126)
BILIRUBIN TOTAL: 0.7 mg/dL (ref 0.3–1.2)
BUN: 28 mg/dL — ABNORMAL HIGH (ref 6–20)
CALCIUM: 8.8 mg/dL — AB (ref 8.9–10.3)
CO2: 24 mmol/L (ref 22–32)
CREATININE: 1.49 mg/dL — AB (ref 0.61–1.24)
Chloride: 111 mmol/L (ref 101–111)
GFR calc Af Amer: 43 mL/min — ABNORMAL LOW (ref >60)
GFR calc non Af Amer: 37 mL/min — ABNORMAL LOW (ref >60)
GLUCOSE: 105 mg/dL — AB (ref 65–99)
Potassium: 4.3 mmol/L (ref 3.5–5.1)
SODIUM: 141 mmol/L (ref 135–145)
TOTAL PROTEIN: 6.5 g/dL (ref 6.5–8.1)

## 2017-03-03 LAB — INFLUENZA PANEL BY PCR (TYPE A & B)
Influenza A By PCR: POSITIVE — AB
Influenza B By PCR: NEGATIVE

## 2017-03-03 LAB — CBC WITH DIFFERENTIAL/PLATELET
BASOS ABS: 0 10*3/uL (ref 0.0–0.1)
BASOS PCT: 0 %
EOS ABS: 0 10*3/uL (ref 0.0–0.7)
EOS PCT: 0 %
HCT: 34.1 % — ABNORMAL LOW (ref 39.0–52.0)
Hemoglobin: 11.5 g/dL — ABNORMAL LOW (ref 13.0–17.0)
Lymphocytes Relative: 8 %
Lymphs Abs: 0.4 10*3/uL — ABNORMAL LOW (ref 0.7–4.0)
MCH: 31.7 pg (ref 26.0–34.0)
MCHC: 33.7 g/dL (ref 30.0–36.0)
MCV: 93.9 fL (ref 78.0–100.0)
MONO ABS: 0.3 10*3/uL (ref 0.1–1.0)
MONOS PCT: 6 %
Neutro Abs: 4.5 10*3/uL (ref 1.7–7.7)
Neutrophils Relative %: 86 %
PLATELETS: 183 10*3/uL (ref 150–400)
RBC: 3.63 MIL/uL — ABNORMAL LOW (ref 4.22–5.81)
RDW: 15.6 % — AB (ref 11.5–15.5)
WBC: 5.2 10*3/uL (ref 4.0–10.5)

## 2017-03-03 LAB — I-STAT CG4 LACTIC ACID, ED: Lactic Acid, Venous: 1.04 mmol/L (ref 0.5–1.9)

## 2017-03-03 MED ORDER — SODIUM CHLORIDE 0.9 % IV BOLUS (SEPSIS)
500.0000 mL | Freq: Once | INTRAVENOUS | Status: AC
  Administered 2017-03-03: 500 mL via INTRAVENOUS

## 2017-03-03 MED ORDER — OSELTAMIVIR PHOSPHATE 75 MG PO CAPS
75.0000 mg | ORAL_CAPSULE | Freq: Once | ORAL | Status: AC
  Administered 2017-03-03: 75 mg via ORAL
  Filled 2017-03-03: qty 1

## 2017-03-03 MED ORDER — ACETAMINOPHEN 325 MG PO TABS
650.0000 mg | ORAL_TABLET | Freq: Once | ORAL | Status: AC
  Administered 2017-03-03: 650 mg via ORAL
  Filled 2017-03-03: qty 2

## 2017-03-03 MED ORDER — OSELTAMIVIR PHOSPHATE 75 MG PO CAPS: 75.0000 mg | ORAL_CAPSULE | Freq: Two times a day (BID) | ORAL | 0 refills | Status: DC

## 2017-03-03 NOTE — ED Provider Notes (Signed)
[facility] DEPT Provider Note   CSN: [id number] Arrival date & time: 03/02/17  2347     History   Chief Complaint Chief Complaint  Patient presents with  . Cough    HPI Howard Padilla is a 84 y.o. male who presents with a fever. PMH significant for dementia, HTN, HLD, CKD, hx of CVA, hx of lung abscess, hx of colon cancer. He is a poor historian and is not clear why he is here. He states that he doesn't feel well and this started last night. He denies any symptoms other than runny nose. He states he lives with his wife [name]. He did not know he had a fever. He denies any known sick contacts. He denies any pain. He denies chest pain, SOB, cough, abdominal pain, N/V/D, dysuria.   LEVEL 5 CAVEAT due to dementia  HPI  Past Medical History:  Diagnosis Date  . BPH (benign prostatic hypertrophy)   . Cancer ([place])    colon  . Dementia   . DVT (deep venous thrombosis) ([place])    with pulmonary embo.  Marland Kitchen Hyperlipidemia   . Hypertension     Patient Active Problem List   Diagnosis Date Noted  . Bradycardia 07/14/2016  . Aphasia 01/06/2015  . Stroke ([place]) 01/06/2015  . Slurred speech   . TIA (transient ischemic attack)   . Urinary retention 12/25/2014  . Lung nodule 06/24/2013  . CAP (community acquired pneumonia) 06/23/2013  . 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 Lung abscess ([place]) 06/22/2013  . CKD (chronic kidney disease) stage 3, GFR 30-59 ml/min (HCC) 06/22/2013  . OTHER ABNORMAL BLOOD CHEMISTRY 02/03/2010  . PERSONAL HISTORY OF THROMBOPHLEBITIS 02/03/2010  . WEIGHT LOSS 10/29/2008  . DVT 06/24/2007  . LEG PAIN, LEFT 05/28/2007  . SYNCOPE 05/28/2007  . ACUTE BRONCHITIS 05/25/2007  . Anemia of renal disease 11/05/2006  . HLD (hyperlipidemia) 07/30/2006  . Essential hypertension 07/30/2006  . BPH (benign prostatic hyperplasia) 07/30/2006  . History of malignant neoplasm of large intestine 07/30/2006    Past Surgical History:    Procedure Laterality Date  . COLON SURGERY     partial colectomy  . HERNIA REPAIR     ingunial       Home Medications    Prior to Admission medications   Medication Sig Start Date End Date Taking? Authorizing Provider  acetaminophen (TYLENOL) 500 MG tablet Take 2 tablets (1,000 mg total) by mouth every 6 (six) hours as needed. Patient taking differently: Take 1,000 mg by mouth every 6 (six) hours as needed for moderate pain or headache.  12/09/14   Charlesetta Shanks, MD  aspirin 325 MG tablet Take 1 tablet (325 mg total) by mouth daily. 01/08/15   Kelvin Cellar, MD  doxazosin (CARDURA) 4 MG tablet TAKE 1 TABLET BY MOUTH EVERYDAY AT BEDTIME 02/28/17   [name], MD  fosinopril (MONOPRIL) 20 MG tablet Take 1 tablet (20 mg total) by mouth daily. 08/10/16   [name], MD  Multiple Vitamin (MULTIVITAMIN WITH MINERALS) TABS tablet Take 1 tablet by mouth daily.    [provider]  ondansetron (ZOFRAN ODT) 4 MG disintegrating tablet Take 1 tablet (4 mg total) by mouth every 8 (eight) hours as needed for nausea or vomiting. 09/29/16   Ward, Delice Bison, DO  vitamin C (ASCORBIC ACID) 500 MG tablet Take 500 mg by mouth daily.    [provider]    Family History Family History  Family history unknown: Yes  Social History Social History   Tobacco Use  . Smoking status: Former Smoker    Packs/day: 2.00    Years: 30.00    Pack years: 60.00    Types: Cigarettes    Last attempt to quit: 02/28/1983    Years since quitting: 34.0  . Smokeless tobacco: Never Used  Substance Use Topics  . Alcohol use: No  . Drug use: No     Allergies   Penicillins   Review of Systems Review of Systems  Unable to perform ROS: Dementia     Physical Exam Updated Vital Signs BP (!) 185/95 (BP Location: Left Arm)   Pulse 96   Temp (!) 100.7 F (38.2 C) (Rectal)   Resp 20   Ht 5\' 9"  (1.753 m)   Wt 62.1 kg (137 lb)   SpO2 100%   BMI 20.23 kg/m   Physical  Exam  Constitutional: He is oriented to person, place, and time. He appears well-developed and well-nourished. No distress.  Cachetic, elderly male in NAD  HENT:  Head: Normocephalic and atraumatic.  Nose: Rhinorrhea present.  Edentulous  Mildly dry mucous membranes  Eyes: Conjunctivae are normal. Pupils are equal, round, and reactive to light. Right eye exhibits no discharge. Left eye exhibits no discharge. No scleral icterus.  Neck: Normal range of motion.  Cardiovascular: Normal rate and regular rhythm. Exam reveals no gallop and no friction rub.  No murmur heard. Pulmonary/Chest: Effort normal. No stridor. No respiratory distress. He has decreased breath sounds. He has no wheezes. He has no rales. He exhibits no tenderness.  Abdominal: Soft. Bowel sounds are normal. He exhibits no distension. There is no tenderness.  Neurological: He is alert and oriented to person, place, and time.  Skin: Skin is warm and dry.  Psychiatric: He has a normal mood and affect. His behavior is normal.  Nursing note and vitals reviewed.    ED Treatments / Results  Labs (all labs ordered are listed, but only abnormal results are displayed) Labs Reviewed  COMPREHENSIVE METABOLIC PANEL - Abnormal; Notable for the following components:      Result Value   Glucose, Bld 105 (*)    BUN 28 (*)    Creatinine, Ser 1.49 (*)    Calcium 8.8 (*)    ALT 16 (*)    GFR calc non Af Amer 37 (*)    GFR calc Af Amer 43 (*)    All other components within normal limits  CBC WITH DIFFERENTIAL/PLATELET - Abnormal; Notable for the following components:   RBC 3.63 (*)    Hemoglobin 11.5 (*)    HCT 34.1 (*)    RDW 15.6 (*)    Lymphs Abs 0.4 (*)    All other components within normal limits  INFLUENZA PANEL BY PCR (TYPE A & B) - Abnormal; Notable for the following components:   Influenza A By PCR POSITIVE (*)    All other components within normal limits  CULTURE, BLOOD (ROUTINE X 2)  CULTURE, BLOOD (ROUTINE X 2)    I-STAT CG4 LACTIC ACID, ED    EKG  EKG Interpretation  Date/Time:  Friday March 02 2017 23:53:58 EST Ventricular Rate:  95 PR Interval:    QRS Duration: 107 QT Interval:  356 QTC Calculation: 448 R Axis:   -56 Text Interpretation:  Sinus rhythm Probable left atrial enlargement Incomplete RBBB and LAFB Abnormal R-wave progression, early transition When compared with ECG of 09/29/2016, No significant change was found Confirmed by Delora Fuel ([id number]) on  03/02/2017 11:58:04 PM       Radiology Dg Chest 2 View  Result Date: 03/03/2017 CLINICAL DATA:  84 year old with cough. EXAM: CHEST  2 VIEW COMPARISON:  Radiographs 08/20/2015, CT 06/22/2013 FINDINGS: Unchanged heart size and mediastinal contours with aortic tortuosity and atherosclerosis. Underlying emphysema. Left perihilar opacity likely scarring given changes on prior CT. No acute airspace opacity. No pleural effusion or pneumothorax. No acute osseous abnormalities. IMPRESSION: 1. Emphysema. Left perihilar opacity is unchanged from radiograph 18 months prior and likely scarring. 2. No acute abnormality. 3. Aortic atherosclerosis. Electronically Signed   By: Jeb Levering M.D.   On: 03/03/2017 00:56    Procedures Procedures (including critical care time)  Medications Ordered in ED Medications  acetaminophen (TYLENOL) tablet 650 mg (650 mg Oral Given 03/03/17 0045)  sodium chloride 0.9 % bolus 500 mL (0 mLs Intravenous Stopped 03/03/17 0427)  oseltamivir (TAMIFLU) capsule 75 mg (75 mg Oral Given 03/03/17 0358)     Initial Impression / Assessment and Plan / ED Course  I have reviewed the triage vital signs and the nursing notes.  Pertinent labs & imaging results that were available during my care of the patient were reviewed by me and considered in my medical decision making (see chart for details).  84 year old male presents with influenza. He is febrile to 101.1 initially in the ED and hypertensive. He does have an occasional  cough but otherwise exam is unremarkable. Labs are overall reassuring. CBC is remarkable for mild anemia. CMP is remarkable for elevated SCr - he has known CKD. Lactic acid is normal. CXR is negative. Influenza swab came back positive for influenza A. He was given fluids, Tylenol, and Tamiflu. He was given a rx for 5 day course of Tamiflu. Results were discussed with the patient and family. Shared visit with Dr. Roxanne Mins who feels the patient is okay for discharge. They were given strict return precautions.  Final Clinical Impressions(s) / ED Diagnoses   Final diagnoses:  Influenza A    ED Discharge Orders    None       Iris Pert 69/48/54 [id number]    Delora Fuel, MD 35/00/93 [id number]    Delora Fuel, MD 99/37/16 [phone]

## 2017-03-03 NOTE — ED Notes (Addendum)
Patient transported to X-ray 

## 2017-03-03 NOTE — Discharge Instructions (Signed)
Please drink plenty of fluids Take Tylenol for fever Take Tamiflu for Follow up with your doctor Return to the ED for worsening symptoms

## 2017-03-03 NOTE — ED Notes (Signed)
Lab called with positive flu swab-Dr. Roxanne Mins is aware

## 2017-03-03 NOTE — ED Provider Notes (Signed)
84 year old male had onset today of nonproductive cough.  He had some chills at home but was not known to have any fever.  He did receive influenza immunization.  Was a sick contact at home.  On exam, he is nontoxic in appearance although he does have a low-grade fever.  Lungs have diminished airflow without rales or wheezes.  Initial lactic acid level is normal.  Chest x-ray report is pending.  Medical screening examination/treatment/procedure(s) were conducted as a shared visit with non-physician practitioner(s) and myself.  I personally evaluated the patient during the encounter.   EKG Interpretation  Date/Time:  Friday March 02 2017 23:53:58 EST Ventricular Rate:  95 PR Interval:    QRS Duration: 107 QT Interval:  356 QTC Calculation: 448 R Axis:   -56 Text Interpretation:  Sinus rhythm Probable left atrial enlargement Incomplete RBBB and LAFB Abnormal R-wave progression, early transition When compared with ECG of 09/29/2016, No significant change was found Confirmed by Delora Fuel ([id number]) on 03/02/2017 75:44:92 PM        Delora Fuel, MD 01/00/71 2247

## 2017-03-08 LAB — CULTURE, BLOOD (ROUTINE X 2)
CULTURE: NO GROWTH
SPECIAL REQUESTS: ADEQUATE

## 2017-05-28 ENCOUNTER — Other Ambulatory Visit: Payer: Self-pay | Admitting: Internal Medicine

## 2017-07-20 ENCOUNTER — Other Ambulatory Visit: Payer: Self-pay | Admitting: Internal Medicine

## 2017-08-21 ENCOUNTER — Observation Stay (HOSPITAL_COMMUNITY): Payer: Medicare Other

## 2017-08-21 ENCOUNTER — Inpatient Hospital Stay (HOSPITAL_COMMUNITY)
Admission: EM | Admit: 2017-08-21 | Discharge: 2017-08-25 | DRG: 194 | Disposition: A | Discharge status: Home Health | Payer: Medicare Other | Attending: Family Medicine | Admitting: Family Medicine

## 2017-08-21 ENCOUNTER — Other Ambulatory Visit: Payer: Self-pay

## 2017-08-21 ENCOUNTER — Emergency Department (HOSPITAL_COMMUNITY): Payer: Medicare Other

## 2017-08-21 DIAGNOSIS — Z86711 Personal history of pulmonary embolism: Secondary | ICD-10-CM | POA: Diagnosis not present

## 2017-08-21 DIAGNOSIS — R739 Hyperglycemia, unspecified: Secondary | ICD-10-CM | POA: Diagnosis present

## 2017-08-21 DIAGNOSIS — G309 Alzheimer's disease, unspecified: Secondary | ICD-10-CM

## 2017-08-21 DIAGNOSIS — R0902 Hypoxemia: Secondary | ICD-10-CM | POA: Diagnosis present

## 2017-08-21 DIAGNOSIS — J440 Chronic obstructive pulmonary disease with acute lower respiratory infection: Secondary | ICD-10-CM | POA: Diagnosis not present

## 2017-08-21 DIAGNOSIS — Z87891 Personal history of nicotine dependence: Secondary | ICD-10-CM

## 2017-08-21 DIAGNOSIS — J189 Pneumonia, unspecified organism: Principal | ICD-10-CM | POA: Diagnosis present

## 2017-08-21 DIAGNOSIS — F0390 Unspecified dementia without behavioral disturbance: Secondary | ICD-10-CM | POA: Diagnosis present

## 2017-08-21 DIAGNOSIS — I959 Hypotension, unspecified: Secondary | ICD-10-CM | POA: Diagnosis not present

## 2017-08-21 DIAGNOSIS — R42 Dizziness and giddiness: Secondary | ICD-10-CM

## 2017-08-21 DIAGNOSIS — Z880 Allergy status to penicillin: Secondary | ICD-10-CM

## 2017-08-21 DIAGNOSIS — R531 Weakness: Secondary | ICD-10-CM | POA: Diagnosis not present

## 2017-08-21 DIAGNOSIS — Z86718 Personal history of other venous thrombosis and embolism: Secondary | ICD-10-CM

## 2017-08-21 DIAGNOSIS — D631 Anemia in chronic kidney disease: Secondary | ICD-10-CM | POA: Diagnosis present

## 2017-08-21 DIAGNOSIS — R55 Syncope and collapse: Secondary | ICD-10-CM | POA: Diagnosis present

## 2017-08-21 DIAGNOSIS — N183 Chronic kidney disease, stage 3 (moderate): Secondary | ICD-10-CM | POA: Diagnosis present

## 2017-08-21 DIAGNOSIS — N179 Acute kidney failure, unspecified: Secondary | ICD-10-CM | POA: Diagnosis not present

## 2017-08-21 DIAGNOSIS — R918 Other nonspecific abnormal finding of lung field: Secondary | ICD-10-CM | POA: Diagnosis not present

## 2017-08-21 DIAGNOSIS — I130 Hypertensive heart and chronic kidney disease with heart failure and stage 1 through stage 4 chronic kidney disease, or unspecified chronic kidney disease: Secondary | ICD-10-CM | POA: Diagnosis not present

## 2017-08-21 DIAGNOSIS — I5032 Chronic diastolic (congestive) heart failure: Secondary | ICD-10-CM | POA: Diagnosis not present

## 2017-08-21 DIAGNOSIS — R0602 Shortness of breath: Secondary | ICD-10-CM

## 2017-08-21 DIAGNOSIS — R001 Bradycardia, unspecified: Secondary | ICD-10-CM | POA: Diagnosis not present

## 2017-08-21 DIAGNOSIS — F0281 Dementia in other diseases classified elsewhere with behavioral disturbance: Secondary | ICD-10-CM | POA: Diagnosis not present

## 2017-08-21 DIAGNOSIS — R1110 Vomiting, unspecified: Secondary | ICD-10-CM | POA: Diagnosis not present

## 2017-08-21 DIAGNOSIS — I69320 Aphasia following cerebral infarction: Secondary | ICD-10-CM

## 2017-08-21 DIAGNOSIS — Z79899 Other long term (current) drug therapy: Secondary | ICD-10-CM

## 2017-08-21 DIAGNOSIS — I10 Essential (primary) hypertension: Secondary | ICD-10-CM | POA: Diagnosis not present

## 2017-08-21 DIAGNOSIS — Z7982 Long term (current) use of aspirin: Secondary | ICD-10-CM

## 2017-08-21 DIAGNOSIS — E785 Hyperlipidemia, unspecified: Secondary | ICD-10-CM | POA: Diagnosis present

## 2017-08-21 DIAGNOSIS — I444 Left anterior fascicular block: Secondary | ICD-10-CM | POA: Diagnosis present

## 2017-08-21 DIAGNOSIS — I4510 Unspecified right bundle-branch block: Secondary | ICD-10-CM | POA: Diagnosis present

## 2017-08-21 LAB — LIPID PANEL
CHOLESTEROL: 211 mg/dL — AB (ref 0–200)
HDL: 68 mg/dL (ref >40)
LDL Cholesterol: 136 mg/dL — ABNORMAL HIGH (ref 0–99)
TRIGLYCERIDES: 34 mg/dL (ref <150)
Total CHOL/HDL Ratio: 3.1 RATIO
VLDL: 7 mg/dL (ref 0–40)

## 2017-08-21 LAB — COMPREHENSIVE METABOLIC PANEL
ALK PHOS: 75 U/L (ref 38–126)
ALT: 14 U/L (ref 0–44)
ANION GAP: 9 (ref 5–15)
AST: 18 U/L (ref 15–41)
Albumin: 3 g/dL — ABNORMAL LOW (ref 3.5–5.0)
BUN: 24 mg/dL — ABNORMAL HIGH (ref 8–23)
CALCIUM: 8.7 mg/dL — AB (ref 8.9–10.3)
CHLORIDE: 108 mmol/L (ref 98–111)
CO2: 24 mmol/L (ref 22–32)
Creatinine, Ser: 1.77 mg/dL — ABNORMAL HIGH (ref 0.61–1.24)
GFR calc non Af Amer: 30 mL/min — ABNORMAL LOW (ref >60)
GFR, EST AFRICAN AMERICAN: 35 mL/min — AB (ref >60)
GLUCOSE: 163 mg/dL — AB (ref 70–99)
POTASSIUM: 4.5 mmol/L (ref 3.5–5.1)
SODIUM: 141 mmol/L (ref 135–145)
Total Bilirubin: 0.7 mg/dL (ref 0.3–1.2)
Total Protein: 5.6 g/dL — ABNORMAL LOW (ref 6.5–8.1)

## 2017-08-21 LAB — TROPONIN I
TROPONIN I: 0.04 ng/mL — AB (ref <0.03)
TROPONIN I: 0.04 ng/mL — AB (ref <0.03)
Troponin I: 0.03 ng/mL (ref <0.03)

## 2017-08-21 LAB — CBC WITH DIFFERENTIAL/PLATELET
Abs Immature Granulocytes: 0 10*3/uL (ref 0.0–0.1)
BASOS ABS: 0 10*3/uL (ref 0.0–0.1)
BASOS PCT: 0 %
EOS ABS: 0 10*3/uL (ref 0.0–0.7)
EOS PCT: 0 %
HCT: 32 % — ABNORMAL LOW (ref 39.0–52.0)
HEMOGLOBIN: 10.2 g/dL — AB (ref 13.0–17.0)
Immature Granulocytes: 0 %
LYMPHS PCT: 6 %
Lymphs Abs: 0.4 10*3/uL — ABNORMAL LOW (ref 0.7–4.0)
MCH: 31 pg (ref 26.0–34.0)
MCHC: 31.9 g/dL (ref 30.0–36.0)
MCV: 97.3 fL (ref 78.0–100.0)
Monocytes Absolute: 0.4 10*3/uL (ref 0.1–1.0)
Monocytes Relative: 7 %
Neutro Abs: 5.7 10*3/uL (ref 1.7–7.7)
Neutrophils Relative %: 87 %
PLATELETS: 168 10*3/uL (ref 150–400)
RBC: 3.29 MIL/uL — ABNORMAL LOW (ref 4.22–5.81)
RDW: 15.4 % (ref 11.5–15.5)
WBC: 6.6 10*3/uL (ref 4.0–10.5)

## 2017-08-21 LAB — URINALYSIS, ROUTINE W REFLEX MICROSCOPIC
BILIRUBIN URINE: NEGATIVE
Glucose, UA: NEGATIVE mg/dL
Hgb urine dipstick: NEGATIVE
Ketones, ur: NEGATIVE mg/dL
LEUKOCYTES UA: NEGATIVE
NITRITE: NEGATIVE
PH: 6 (ref 5.0–8.0)
Protein, ur: NEGATIVE mg/dL
Specific Gravity, Urine: 1.012 (ref 1.005–1.030)

## 2017-08-21 LAB — HEMOGLOBIN A1C
Hgb A1c MFr Bld: 5.8 % — ABNORMAL HIGH (ref 4.8–5.6)
Mean Plasma Glucose: 119.76 mg/dL

## 2017-08-21 LAB — D-DIMER, QUANTITATIVE (NOT AT ARMC): D DIMER QUANT: 8.62 ug{FEU}/mL — AB (ref 0.00–0.50)

## 2017-08-21 LAB — GLUCOSE, CAPILLARY: Glucose-Capillary: 140 mg/dL — ABNORMAL HIGH (ref 70–99)

## 2017-08-21 MED ORDER — INSULIN ASPART 100 UNIT/ML ~~LOC~~ SOLN: 0.0000 [IU] | Freq: Three times a day (TID) | SUBCUTANEOUS | Status: DC

## 2017-08-21 MED ORDER — SODIUM CHLORIDE 0.9 % IV BOLUS
500.0000 mL | Freq: Once | INTRAVENOUS | Status: AC
  Administered 2017-08-21: 500 mL via INTRAVENOUS

## 2017-08-21 MED ORDER — HEPARIN SODIUM (PORCINE) 5000 UNIT/ML IJ SOLN
5000.0000 [IU] | Freq: Three times a day (TID) | INTRAMUSCULAR | Status: DC
  Administered 2017-08-21 – 2017-08-25 (×10): 5000 [IU] via SUBCUTANEOUS
  Filled 2017-08-21 (×11): qty 1

## 2017-08-21 MED ORDER — LABETALOL HCL 5 MG/ML IV SOLN
10.0000 mg | INTRAVENOUS | Status: DC | PRN
  Administered 2017-08-21: 10 mg via INTRAVENOUS
  Filled 2017-08-21: qty 4

## 2017-08-21 MED ORDER — SODIUM CHLORIDE 0.9 % IV SOLN
INTRAVENOUS | Status: DC
  Administered 2017-08-21 – 2017-08-22 (×2): via INTRAVENOUS

## 2017-08-21 MED ORDER — ONDANSETRON HCL 4 MG/2ML IJ SOLN: 4.0000 mg | Freq: Four times a day (QID) | INTRAMUSCULAR | Status: DC | PRN

## 2017-08-21 MED ORDER — AMLODIPINE BESYLATE 10 MG PO TABS
10.0000 mg | ORAL_TABLET | Freq: Every day | ORAL | Status: DC
  Administered 2017-08-21 – 2017-08-25 (×5): 10 mg via ORAL
  Filled 2017-08-21 (×6): qty 1

## 2017-08-21 MED ORDER — DOXAZOSIN MESYLATE 1 MG PO TABS
1.0000 mg | ORAL_TABLET | Freq: Every day | ORAL | Status: DC
  Administered 2017-08-22 – 2017-08-24 (×4): 1 mg via ORAL
  Filled 2017-08-21 (×5): qty 1

## 2017-08-21 MED ORDER — ASPIRIN 325 MG PO TABS
325.0000 mg | ORAL_TABLET | Freq: Every day | ORAL | Status: DC
  Administered 2017-08-22 – 2017-08-24 (×3): 325 mg via ORAL
  Filled 2017-08-21 (×3): qty 1

## 2017-08-21 NOTE — ED Notes (Signed)
Patient transported to CT 

## 2017-08-21 NOTE — ED Notes (Signed)
Pt back from CT

## 2017-08-21 NOTE — H&P (Signed)
History and Physical  Howard Padilla QAS:[id number] DOB: 08/08/1915 DOA: 08/21/2017  Referring physician: Dr Tomi Bamberger  PCP: [name], MD  Outpatient Specialists: Unknown Patient coming from: Home  Chief Complaint: Dizziness and nearly passing out  HPI: Howard Padilla is a 84 y.o. male with medical history significant for prior CVA with residual aphasia, at baseline ambulatory with no assist, dementia, chronic normocytic anemia, CKD 3, HTN, who presented to ED [facility] after nearly passing out at home.  Symptoms associated with increasing difficulty ambulating, drowsiness and vomiting once today.  At the time of this visit the patient's wife was not in the room, called her contact information multiple times with no answer.  History is mostly obtained from ED physician and medical records.  Patient is alert but confused in the setting of dementia.  ED Course: Upon presentation to the ED, vital signs remarkable for accelerated hypertension, tachycardia and hypoxia with pulse ox 87%.  Lab studies remarkable for elevated creatinine of 1.77 with baseline creatinine 1.4, hyperglycemia with chemistry glucose of 163.  Troponin 0.03.  Denies chest pain.  CT head no contrast done on admission unremarkable for any acute intracranial abnormality.  Revealed remote moderately large posterior left frontoparietal lobe infarct.  No urine analysis or chest x-ray available at the time of this dictation.  Admitted for presyncope.  Review of Systems: Review of systems as noted in the HPI.  All other systems reviewed and are negative.   Past Medical History:  Diagnosis Date  . BPH (benign prostatic hypertrophy)   . Cancer ([place])    colon  . Dementia   . DVT (deep venous thrombosis) ([place])    with pulmonary embo.  Marland Kitchen Hyperlipidemia   . Hypertension    Past Surgical History:  Procedure Laterality Date  . COLON SURGERY     partial colectomy  . HERNIA REPAIR     ingunial    Social History:  reports  that he quit smoking about 34 years ago. His smoking use included cigarettes. He has a 60.00 pack-year smoking history. He has never used smokeless tobacco. He reports that he does not drink alcohol or use drugs.   Allergies  Allergen Reactions  . Penicillins Rash and Other (See Comments)    Has patient had a PCN reaction causing immediate rash, facial/tongue/throat swelling, SOB or lightheadedness with hypotension: No Has patient had a PCN reaction causing severe rash involving mucus membranes or skin necrosis: No Has patient had a PCN reaction that required hospitalization No Has patient had a PCN reaction occurring within the last 10 years: No If all of the above answers are "NO", then may proceed with Cephalosporin use.     Family History  Family history unknown: Yes    Unable to obtain a history from the patient due to altered mental status in the setting of dementia.  Called his wife multiple times and no answer.  Prior to Admission medications   Medication Sig Start Date End Date Taking? Authorizing Provider  acetaminophen (TYLENOL) 500 MG tablet Take 2 tablets (1,000 mg total) by mouth every 6 (six) hours as needed. Patient taking differently: Take 1,000 mg by mouth every 6 (six) hours as needed for moderate pain or headache.  12/09/14  Yes Charlesetta Shanks, MD  aspirin 325 MG tablet Take 1 tablet (325 mg total) by mouth daily. 01/08/15  Yes Kelvin Cellar, MD  doxazosin (CARDURA) 4 MG tablet TAKE 1 TABLET BY MOUTH EVERYDAY AT BEDTIME 05/29/17  Yes [name], MD  fosinopril (MONOPRIL) 20 MG tablet TAKE 1 TABLET BY MOUTH EVERY DAY 07/20/17  Yes [name], MD  Multiple Vitamin (MULTIVITAMIN WITH MINERALS) TABS tablet Take 1 tablet by mouth daily.   Yes [provider]  vitamin C (ASCORBIC ACID) 500 MG tablet Take 500 mg by mouth daily.   Yes [provider]  ondansetron (ZOFRAN ODT) 4 MG disintegrating tablet Take 1 tablet (4 mg total) by mouth  every 8 (eight) hours as needed for nausea or vomiting. Patient not taking: Reported on 08/21/2017 09/29/16   Ward, Delice Bison, DO    Physical Exam: BP (!) 175/98   Pulse (!) 107   Temp 98 F (36.7 C) (Oral)   Resp 19   SpO2 (!) 87%   . General: 84 y.o. year-old male well developed well nourished in no acute distress.  Alert but confused in the setting of advanced dementia.  . Cardiovascular: Regular rate and rhythm with no rubs or gallops.  No thyromegaly or JVD noted.   Marland Kitchen Respiratory: Clear to auscultation with no wheezes or rales. Good inspiratory effort. . Abdomen: Soft nontender nondistended with normal bowel sounds x4 quadrants. . Musculoskeletal: No lower extremity edema. 2/4 pulses in all 4 extremities. . Skin: No ulcerative lesions noted or rashes, . Psychiatry: Unable to assess due to altered mental status.          Labs on Admission:  Basic Metabolic Panel: Recent Labs  Lab 08/21/17 1251  NA 141  K 4.5  CL 108  CO2 24  GLUCOSE 163*  BUN 24*  CREATININE 1.77*  CALCIUM 8.7*   Liver Function Tests: Recent Labs  Lab 08/21/17 1251  AST 18  ALT 14  ALKPHOS 75  BILITOT 0.7  PROT 5.6*  ALBUMIN 3.0*   No results for input(s): LIPASE, AMYLASE in the last 168 hours. No results for input(s): AMMONIA in the last 168 hours. CBC: Recent Labs  Lab 08/21/17 1251  WBC 6.6  NEUTROABS 5.7  HGB 10.2*  HCT 32.0*  MCV 97.3  PLT 168   Cardiac Enzymes: Recent Labs  Lab 08/21/17 1251  TROPONINI 0.03*    BNP (last 3 results) No results for input(s): BNP in the last 8760 hours.  ProBNP (last 3 results) No results for input(s): PROBNP in the last 8760 hours.  CBG: No results for input(s): GLUCAP in the last 168 hours.  Radiological Exams on Admission: Ct Head Wo Contrast  Result Date: 08/21/2017 CLINICAL DATA:  One 84 year old male became weak and dizzy at home. No injury. Initial encounter. EXAM: CT HEAD WITHOUT CONTRAST TECHNIQUE: Contiguous axial  images were obtained from the base of the skull through the vertex without intravenous contrast. COMPARISON:  07/14/2016 CT. FINDINGS: Brain: No intracranial hemorrhage or CT evidence of large acute infarct. Remote moderately large posterior left frontal-parietal lobe infarct with encephalomalacia. Marked chronic microvascular changes. Moderate to marked global atrophy. No intracranial mass lesion noted on this unenhanced exam. Vascular: Vascular calcifications Skull: No acute abnormality. Sinuses/Orbits: No acute orbital abnormality. Visualized paranasal sinuses clear. Other: Opacification left mastoid air cells and middle ear cavity without obstructing lesion of the eustachian tube noted. Transverse ligament hypertrophy with mild narrowing ventral aspect upper cervical canal. IMPRESSION: No acute intracranial abnormality. Remote moderately large posterior left frontal-parietal lobe infarct. Marked chronic microvascular changes. Moderate to marked global atrophy. Opacification left mastoid air cells and middle ear cavity without obstructing lesion of the eustachian tube noted. Electronically Signed   By: Genia Del  M.D.   On: 08/21/2017 14:44    EKG: Independently reviewed.  Incomplete right bundle branch block sinus rhythm rate 73  Assessment/Plan Present on Admission: **None**  Active Problems:   Postural dizziness with presyncope  Postural dizziness with presyncope CT head with no contrast revealed no acute intracranial abnormalities Per ED physician at baseline walking independently without assist PT to assess Fall precautions Orthostatic vital signs negative Obtain carotid duplex ultrasound Obtain UA to rule out infective process Obtain chest x-ray  Abnormal EKG Denies chest pain Incomplete right bundle branch block Repeat EKG in 3 hours Monitor on telemetry First set troponin 0.03 Cycle troponin x3  Acute hypoxia, unclear etiology Not on O2 supplementation at home Obtain  chest x-ray Obtain d-dimer-If positive obtain CTA PE O2 supplementation to maintain O2 saturation 92% or greater Monitor on telemetry  AKI on CKD 3 Creatinine 1.77 Baseline creatinine 1.4 GFR 35 Avoid nephrotoxic agents/hypotension/dehydration Start gentle IV hydration normal saline at 50 cc/h Monitor urine output  Chronic diastolic CHF Last 2D echo done on 01/07/2015 revealed LVEF 55 to 60% with no diagnostic regional wall motion abnormality and grade 1 diastolic dysfunction Strict I's and O Daily weight  History of CVA with aphasia Baseline ambulatory with no assist per ED physician as informed by his wife PT to assess  Hyperglycemia Last hemoglobin A1c 6.0 from 01/06/2015 Obtain hemoglobin A1c Start insulin sliding scale sensitive Avoid hypoglycemia Heart healthy diabetic diet     DVT prophylaxis: Subcu heparin 3 times daily  Code Status: Full code for now.  Called contact number in the chart with no answer.  Please contact wife/family and obtain a CODE STATUS.  Family Communication: Called wife multiple times with no answer.  Disposition Plan: Admit to telemetry unit  Consults called: None  Admission status: Observation    Kayleen Memos MD [facility] Pager [phone]  If 7PM-7AM, please contact night-coverage [email or web address] Password [employer]  08/21/2017, 5:21 PM

## 2017-08-21 NOTE — ED Notes (Signed)
Pt was unable to stand for orthostatics.   

## 2017-08-21 NOTE — ED Triage Notes (Signed)
Patient is from home with his wife.  He began feeling weak and dizzy and sat on the floor.  EMS called.  Denies LOC.  EMS reports that he was helped to the chair, IV started and a 350 cc fluid bolus given.  Improvement noted.Patient transported to [facility] ED.

## 2017-08-21 NOTE — ED Provider Notes (Signed)
Pt seen by Dr Lita Mains initially.  Please see his note.   Pt appears stable at this time.  Attempted to stand earlier and was too weak.  No clear etiology for his near syncopal episode.  Cannot exclude cardiac etiology.  Will continue with IV hydration, consult with medical service to observe overnight.     Dorie Rank, MD 08/21/17 [phone]

## 2017-08-21 NOTE — ED Provider Notes (Signed)
[place] EMERGENCY DEPARTMENT Provider Note   CSN: [id number] Arrival date & time: 08/21/17  1221     History   Chief Complaint Chief Complaint  Patient presents with  . Dizziness  . Weakness    HPI Howard Padilla is a 84 y.o. male.  HPI Patient with history of dementia.  Unable to contribute to history.  Level 5 caveat.  Brought in by EMS.  Per wife patient ambulates normally without assistance.  Began having difficulty ambulating and sat on the floor.  Had one episode of vomiting.  Patient has not been complaining of any symptoms.  Mildly more drowsy than his baseline per wife. Past Medical History:  Diagnosis Date  . BPH (benign prostatic hypertrophy)   . Cancer ([place])    colon  . Dementia   . DVT (deep venous thrombosis) ([place])    with pulmonary embo.  Marland Kitchen Hyperlipidemia   . Hypertension     Patient Active Problem List   Diagnosis Date Noted  . Postural dizziness with presyncope 08/21/2017  . Bradycardia 07/14/2016  . Aphasia 01/06/2015  . Stroke ([place]) 01/06/2015  . Slurred speech   . TIA (transient ischemic attack)   . Urinary retention 12/25/2014  . Lung nodule 06/24/2013  . CAP (community acquired pneumonia) 06/23/2013  . 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 Lung abscess ([place]) 06/22/2013  . CKD (chronic kidney disease) stage 3, GFR 30-59 ml/min (HCC) 06/22/2013  . OTHER ABNORMAL BLOOD CHEMISTRY 02/03/2010  . PERSONAL HISTORY OF THROMBOPHLEBITIS 02/03/2010  . WEIGHT LOSS 10/29/2008  . DVT 06/24/2007  . LEG PAIN, LEFT 05/28/2007  . SYNCOPE 05/28/2007  . ACUTE BRONCHITIS 05/25/2007  . Anemia of renal disease 11/05/2006  . HLD (hyperlipidemia) 07/30/2006  . Essential hypertension 07/30/2006  . BPH (benign prostatic hyperplasia) 07/30/2006  . History of malignant neoplasm of large intestine 07/30/2006    Past Surgical History:  Procedure Laterality Date  . COLON SURGERY     partial colectomy  . HERNIA REPAIR     ingunial         Home Medications    Prior to Admission medications   Medication Sig Start Date End Date Taking? Authorizing Provider  acetaminophen (TYLENOL) 500 MG tablet Take 2 tablets (1,000 mg total) by mouth every 6 (six) hours as needed. Patient taking differently: Take 1,000 mg by mouth every 6 (six) hours as needed for moderate pain or headache.  12/09/14  Yes Charlesetta Shanks, MD  aspirin 325 MG tablet Take 1 tablet (325 mg total) by mouth daily. 01/08/15  Yes Kelvin Cellar, MD  doxazosin (CARDURA) 4 MG tablet TAKE 1 TABLET BY MOUTH EVERYDAY AT BEDTIME 05/29/17  Yes [name], MD  fosinopril (MONOPRIL) 20 MG tablet TAKE 1 TABLET BY MOUTH EVERY DAY 07/20/17  Yes [name], MD  Multiple Vitamin (MULTIVITAMIN WITH MINERALS) TABS tablet Take 1 tablet by mouth daily.   Yes [provider]  vitamin C (ASCORBIC ACID) 500 MG tablet Take 500 mg by mouth daily.   Yes [provider]  ondansetron (ZOFRAN ODT) 4 MG disintegrating tablet Take 1 tablet (4 mg total) by mouth every 8 (eight) hours as needed for nausea or vomiting. Patient not taking: Reported on 08/21/2017 09/29/16   Ward, Delice Bison, DO    Family History Family History  Family history unknown: Yes    Social History Social History   Tobacco Use  . Smoking status: Former Smoker    Packs/day: 2.00    Years:  30.00    Pack years: 60.00    Types: Cigarettes    Last attempt to quit: 02/28/1983    Years since quitting: 34.5  . Smokeless tobacco: Never Used  Substance Use Topics  . Alcohol use: No  . Drug use: No     Allergies   Penicillins   Review of Systems Review of Systems  Unable to perform ROS: Dementia     Physical Exam Updated Vital Signs BP (!) 158/84 (BP Location: Left Arm)   Pulse 80   Temp 98.6 F (37 C) (Oral)   Resp 16   Ht 5\' 5"  (1.651 m)   Wt 56.4 kg (124 lb 6.4 oz) Comment: scale c  SpO2 98%   BMI 20.70 kg/m   Physical Exam  Constitutional: He appears  well-developed and well-nourished. No distress.  HENT:  Head: Normocephalic and atraumatic.  Mouth/Throat: Oropharynx is clear and moist.  No evidence of head injury.  Eyes: Pupils are equal, round, and reactive to light. EOM are normal.  Neck: Normal range of motion. Neck supple.  No posterior midline cervical tenderness to palpation.  No meningismus.  Cardiovascular: Normal rate and regular rhythm. Exam reveals no gallop and no friction rub.  No murmur heard. Pulmonary/Chest: Effort normal and breath sounds normal.  Abdominal: Soft. Bowel sounds are normal. There is no tenderness. There is no rebound and no guarding.  Musculoskeletal: Normal range of motion. He exhibits no edema or tenderness.  Neurological:  Patient will follow simple commands.  Very hard of hearing.  Appears to move all extremities without focal weakness.  Sensation to light touch grossly intact.  No definite facial asymmetry appreciated.  Skin: Skin is warm and dry. Capillary refill takes less than 2 seconds. No rash noted. He is not diaphoretic. No erythema.  Psychiatric: He has a normal mood and affect. His behavior is normal.  Nursing note and vitals reviewed.    ED Treatments / Results  Labs (all labs ordered are listed, but only abnormal results are displayed) Labs Reviewed  URINE CULTURE - Abnormal; Notable for the following components:      Result Value   Culture   (*)    Value: <10,000 COLONIES/mL INSIGNIFICANT GROWTH Performed at [facility], [address], [place], [place] [address]    All other components within normal limits  CBC WITH DIFFERENTIAL/PLATELET - Abnormal; Notable for the following components:   RBC 3.29 (*)    Hemoglobin 10.2 (*)    HCT 32.0 (*)    Lymphs Abs 0.4 (*)    All other components within normal limits  COMPREHENSIVE METABOLIC PANEL - Abnormal; Notable for the following components:   Glucose, Bld 163 (*)    BUN 24 (*)    Creatinine, Ser 1.77 (*)    Calcium 8.7  (*)    Total Protein 5.6 (*)    Albumin 3.0 (*)    GFR calc non Af Amer 30 (*)    GFR calc Af Amer 35 (*)    All other components within normal limits  TROPONIN I - Abnormal; Notable for the following components:   Troponin I 0.03 (*)    All other components within normal limits  D-DIMER, QUANTITATIVE (NOT AT [facility]) - Abnormal; Notable for the following components:   D-Dimer, Quant 8.62 (*)    All other components within normal limits  CBC - Abnormal; Notable for the following components:   RBC 3.16 (*)    Hemoglobin 9.8 (*)  HCT 29.5 (*)    All other components within normal limits  BASIC METABOLIC PANEL - Abnormal; Notable for the following components:   Creatinine, Ser 1.57 (*)    Calcium 8.4 (*)    GFR calc non Af Amer 34 (*)    GFR calc Af Amer 40 (*)    All other components within normal limits  TROPONIN I - Abnormal; Notable for the following components:   Troponin I 0.04 (*)    All other components within normal limits  TROPONIN I - Abnormal; Notable for the following components:   Troponin I 0.04 (*)    All other components within normal limits  HEMOGLOBIN A1C - Abnormal; Notable for the following components:   Hgb A1c MFr Bld 5.8 (*)    All other components within normal limits  LIPID PANEL - Abnormal; Notable for the following components:   Cholesterol 211 (*)    LDL Cholesterol 136 (*)    All other components within normal limits  GLUCOSE, CAPILLARY - Abnormal; Notable for the following components:   Glucose-Capillary 140 (*)    All other components within normal limits  GLUCOSE, CAPILLARY - Abnormal; Notable for the following components:   Glucose-Capillary 136 (*)    All other components within normal limits  COMPREHENSIVE METABOLIC PANEL - Abnormal; Notable for the following components:   Creatinine, Ser 1.43 (*)    Calcium 8.4 (*)    Total Protein 5.7 (*)    Albumin 2.8 (*)    GFR calc non Af Amer 39 (*)    GFR calc Af Amer 45 (*)    All other  components within normal limits  CBC - Abnormal; Notable for the following components:   RBC 3.62 (*)    Hemoglobin 11.0 (*)    HCT 34.1 (*)    All other components within normal limits  BASIC METABOLIC PANEL - Abnormal; Notable for the following components:   Creatinine, Ser 1.43 (*)    Calcium 8.7 (*)    GFR calc non Af Amer 39 (*)    GFR calc Af Amer 45 (*)    All other components within normal limits  CULTURE, BLOOD (ROUTINE X 2)  CULTURE, BLOOD (ROUTINE X 2)  CULTURE, EXPECTORATED SPUTUM-ASSESSMENT  URINALYSIS, ROUTINE W REFLEX MICROSCOPIC  HIV ANTIBODY (ROUTINE TESTING)  STREP PNEUMONIAE URINARY ANTIGEN  GLUCOSE, CAPILLARY  GLUCOSE, CAPILLARY  BASIC METABOLIC PANEL    EKG EKG Interpretation  Date/Time:  Tuesday August 21 2017 12:26:55 EDT Ventricular Rate:  71 PR Interval:    QRS Duration: 118 QT Interval:  415 QTC Calculation: 451 R Axis:   -60 Text Interpretation:  Sinus rhythm Prolonged PR interval Incomplete RBBB and LAFB Probable left ventricular hypertrophy Anterior Q waves, possibly due to LVH Nonspecific T abnormalities, lateral leads Confirmed by Julianne Rice [phone]) on 08/21/2017 1:20:29 PM   Radiology No results found.  Procedures Procedures (including critical care time)  Medications Ordered in ED Medications  doxazosin (CARDURA) tablet 1 mg (1 mg Oral Given 08/23/17 2126)  labetalol (NORMODYNE,TRANDATE) injection 10 mg (10 mg Intravenous Given 08/21/17 2158)  ondansetron (ZOFRAN) injection 4 mg (has no administration in time range)  amLODipine (NORVASC) tablet 10 mg (10 mg Oral Given 08/24/17 1031)  heparin injection 5,000 Units (5,000 Units Subcutaneous Given 08/24/17 1438)  acetaminophen (TYLENOL) tablet 650 mg (650 mg Oral Given 08/22/17 0203)  cefTRIAXone (ROCEPHIN) 1 g in sodium chloride 0.9 % 100 mL IVPB (1 g Intravenous New Bag/Given 08/24/17 1046)  azithromycin (  ZITHROMAX) 500 mg in sodium chloride 0.9 % 250 mL IVPB (500 mg Intravenous New  Bag/Given 08/24/17 1242)  guaiFENesin (ROBITUSSIN) 100 MG/5ML solution 400 mg (400 mg Oral Given 08/24/17 1830)  metoprolol tartrate (LOPRESSOR) tablet 12.5 mg (12.5 mg Oral Given 08/24/17 1831)  aspirin chewable tablet 81 mg (81 mg Oral Given 08/24/17 1831)  0.9 %  sodium chloride infusion ( Intravenous New Bag/Given 08/24/17 1834)  sodium chloride 0.9 % bolus 500 mL (0 mLs Intravenous Stopped 08/21/17 1624)  technetium albumin aggregated (MAA) injection solution 4 millicurie (4 millicuries Intravenous Contrast Given 08/22/17 1229)  technetium TC 58M diethylenetriame-pentaacetic acid (DTPA) injection 31 millicurie (31 millicuries Intravenous Given 08/22/17 1229)     Initial Impression / Assessment and Plan / ED Course  I have reviewed the triage vital signs and the nursing notes.  Pertinent labs & imaging results that were available during my care of the patient were reviewed by me and considered in my medical decision making (see chart for details).     Patient appears to be mildly dehydrated.  Given IV fluids.  Awaiting UA.  Signed out pending UA results and reevaluation.  Final Clinical Impressions(s) / ED Diagnoses   Final diagnoses:  Near syncope  Weakness    ED Discharge Orders    None       Julianne Rice, MD 08/24/17 1907

## 2017-08-22 ENCOUNTER — Observation Stay (HOSPITAL_COMMUNITY): Payer: Medicare Other

## 2017-08-22 ENCOUNTER — Encounter (HOSPITAL_COMMUNITY): Payer: Self-pay

## 2017-08-22 DIAGNOSIS — R739 Hyperglycemia, unspecified: Secondary | ICD-10-CM | POA: Diagnosis present

## 2017-08-22 DIAGNOSIS — J189 Pneumonia, unspecified organism: Secondary | ICD-10-CM | POA: Diagnosis not present

## 2017-08-22 DIAGNOSIS — I4510 Unspecified right bundle-branch block: Secondary | ICD-10-CM | POA: Diagnosis present

## 2017-08-22 DIAGNOSIS — R7989 Other specified abnormal findings of blood chemistry: Secondary | ICD-10-CM | POA: Diagnosis not present

## 2017-08-22 DIAGNOSIS — E785 Hyperlipidemia, unspecified: Secondary | ICD-10-CM | POA: Diagnosis present

## 2017-08-22 DIAGNOSIS — Z7982 Long term (current) use of aspirin: Secondary | ICD-10-CM | POA: Diagnosis not present

## 2017-08-22 DIAGNOSIS — R55 Syncope and collapse: Secondary | ICD-10-CM | POA: Diagnosis not present

## 2017-08-22 DIAGNOSIS — J440 Chronic obstructive pulmonary disease with acute lower respiratory infection: Secondary | ICD-10-CM | POA: Diagnosis present

## 2017-08-22 DIAGNOSIS — R531 Weakness: Secondary | ICD-10-CM | POA: Diagnosis not present

## 2017-08-22 DIAGNOSIS — D631 Anemia in chronic kidney disease: Secondary | ICD-10-CM | POA: Diagnosis present

## 2017-08-22 DIAGNOSIS — N179 Acute kidney failure, unspecified: Secondary | ICD-10-CM | POA: Diagnosis present

## 2017-08-22 DIAGNOSIS — Z880 Allergy status to penicillin: Secondary | ICD-10-CM | POA: Diagnosis not present

## 2017-08-22 DIAGNOSIS — I5032 Chronic diastolic (congestive) heart failure: Secondary | ICD-10-CM | POA: Diagnosis present

## 2017-08-22 DIAGNOSIS — R0902 Hypoxemia: Secondary | ICD-10-CM | POA: Diagnosis not present

## 2017-08-22 DIAGNOSIS — Z86711 Personal history of pulmonary embolism: Secondary | ICD-10-CM | POA: Diagnosis not present

## 2017-08-22 DIAGNOSIS — I444 Left anterior fascicular block: Secondary | ICD-10-CM | POA: Diagnosis present

## 2017-08-22 DIAGNOSIS — R42 Dizziness and giddiness: Secondary | ICD-10-CM | POA: Diagnosis not present

## 2017-08-22 DIAGNOSIS — I69320 Aphasia following cerebral infarction: Secondary | ICD-10-CM | POA: Diagnosis not present

## 2017-08-22 DIAGNOSIS — I130 Hypertensive heart and chronic kidney disease with heart failure and stage 1 through stage 4 chronic kidney disease, or unspecified chronic kidney disease: Secondary | ICD-10-CM | POA: Diagnosis present

## 2017-08-22 DIAGNOSIS — N183 Chronic kidney disease, stage 3 (moderate): Secondary | ICD-10-CM | POA: Diagnosis present

## 2017-08-22 DIAGNOSIS — R0602 Shortness of breath: Secondary | ICD-10-CM | POA: Diagnosis not present

## 2017-08-22 DIAGNOSIS — Z87891 Personal history of nicotine dependence: Secondary | ICD-10-CM | POA: Diagnosis not present

## 2017-08-22 DIAGNOSIS — Z86718 Personal history of other venous thrombosis and embolism: Secondary | ICD-10-CM | POA: Diagnosis not present

## 2017-08-22 DIAGNOSIS — I361 Nonrheumatic tricuspid (valve) insufficiency: Secondary | ICD-10-CM | POA: Diagnosis not present

## 2017-08-22 DIAGNOSIS — Z79899 Other long term (current) drug therapy: Secondary | ICD-10-CM | POA: Diagnosis not present

## 2017-08-22 DIAGNOSIS — F0390 Unspecified dementia without behavioral disturbance: Secondary | ICD-10-CM | POA: Diagnosis present

## 2017-08-22 DIAGNOSIS — I340 Nonrheumatic mitral (valve) insufficiency: Secondary | ICD-10-CM | POA: Diagnosis not present

## 2017-08-22 LAB — CBC
HEMATOCRIT: 29.5 % — AB (ref 39.0–52.0)
HEMOGLOBIN: 9.8 g/dL — AB (ref 13.0–17.0)
MCH: 31 pg (ref 26.0–34.0)
MCHC: 33.2 g/dL (ref 30.0–36.0)
MCV: 93.4 fL (ref 78.0–100.0)
Platelets: 163 10*3/uL (ref 150–400)
RBC: 3.16 MIL/uL — ABNORMAL LOW (ref 4.22–5.81)
RDW: 15.1 % (ref 11.5–15.5)
WBC: 8.9 10*3/uL (ref 4.0–10.5)

## 2017-08-22 LAB — GLUCOSE, CAPILLARY
GLUCOSE-CAPILLARY: 86 mg/dL (ref 70–99)
Glucose-Capillary: 96 mg/dL (ref 70–99)
Glucose-Capillary: 136 mg/dL — ABNORMAL HIGH (ref 70–99)

## 2017-08-22 LAB — BASIC METABOLIC PANEL
ANION GAP: 7 (ref 5–15)
BUN: 21 mg/dL (ref 8–23)
CALCIUM: 8.4 mg/dL — AB (ref 8.9–10.3)
CO2: 23 mmol/L (ref 22–32)
CREATININE: 1.57 mg/dL — AB (ref 0.61–1.24)
Chloride: 108 mmol/L (ref 98–111)
GFR calc Af Amer: 40 mL/min — ABNORMAL LOW (ref >60)
GFR calc non Af Amer: 34 mL/min — ABNORMAL LOW (ref >60)
Glucose, Bld: 94 mg/dL (ref 70–99)
Potassium: 4.1 mmol/L (ref 3.5–5.1)
SODIUM: 138 mmol/L (ref 135–145)

## 2017-08-22 LAB — HIV ANTIBODY (ROUTINE TESTING W REFLEX): HIV Screen 4th Generation wRfx: NONREACTIVE

## 2017-08-22 LAB — STREP PNEUMONIAE URINARY ANTIGEN: STREP PNEUMO URINARY ANTIGEN: NEGATIVE

## 2017-08-22 MED ORDER — TECHNETIUM TC 99M DIETHYLENETRIAME-PENTAACETIC ACID
31.0000 | Freq: Once | INTRAVENOUS | Status: AC | PRN
  Administered 2017-08-22: 31 via INTRAVENOUS

## 2017-08-22 MED ORDER — INSULIN ASPART 100 UNIT/ML ~~LOC~~ SOLN: 0.0000 [IU] | Freq: Three times a day (TID) | SUBCUTANEOUS | Status: DC

## 2017-08-22 MED ORDER — SODIUM CHLORIDE 0.9 % IV SOLN
500.0000 mg | INTRAVENOUS | Status: DC
  Administered 2017-08-22 – 2017-08-24 (×3): 500 mg via INTRAVENOUS
  Filled 2017-08-22 (×4): qty 500

## 2017-08-22 MED ORDER — CEFTRIAXONE SODIUM 1 G IJ SOLR
1.0000 g | INTRAMUSCULAR | Status: DC
  Administered 2017-08-22 – 2017-08-25 (×4): 1 g via INTRAVENOUS
  Filled 2017-08-22 (×4): qty 10

## 2017-08-22 MED ORDER — SODIUM CHLORIDE 0.9 % IV SOLN
INTRAVENOUS | Status: DC | PRN
  Administered 2017-08-22 – 2017-08-23 (×2): via INTRAVENOUS

## 2017-08-22 MED ORDER — TECHNETIUM TO 99M ALBUMIN AGGREGATED
4.0000 | Freq: Once | INTRAVENOUS | Status: AC | PRN
  Administered 2017-08-22: 4 via INTRAVENOUS

## 2017-08-22 MED ORDER — ACETAMINOPHEN 325 MG PO TABS
650.0000 mg | ORAL_TABLET | Freq: Four times a day (QID) | ORAL | Status: DC | PRN
Start: 2017-08-22 — End: 2017-08-25
  Administered 2017-08-22: 650 mg via ORAL

## 2017-08-22 NOTE — Progress Notes (Signed)
Progress Note    Howard Padilla  IRJ:[id number] DOB: [birth year]  DOA: 08/21/2017 PCP: [name], MD    Brief Narrative:     Medical records reviewed and are as summarized below:  Howard Padilla is an 84 y.o. male with medical history significant for prior CVA with residual aphasia, at baseline ambulatory with no assist, dementia, chronic normocytic anemia, CKD 3, HTN, who presented to ED [facility] after nearly passing out at home.  Found to have a fever with pneumonia and an elevated d dimer with V/Q scan pending    Assessment/Plan:   Active Problems:   Postural dizziness with presyncope  Fever with pneumonia on chest x ray -no recent hospitalizations -IV abx -blood cultures pending  Elevated d dimer -V/Q scan pending  Postural dizziness with presyncope CT head with no contrast revealed no acute intracranial abnormalities Per ED physician at baseline walking independently without assist PT evaluation Orthostatic vital signs negative U/a negative  Acute hypoxia, unclear etiology -? PNA -not on O2 currently -V/Q to r/o PE  AKI on CKD 3 Creatinine 1.77 Baseline creatinine 1.4 Avoid nephrotoxic agents/hypotension/dehydration -improved with IVF  Chronic diastolic CHF Last 2D echo done on 01/07/2015 revealed LVEF 55 to 60% with no diagnostic regional wall motion abnormality and grade 1 diastolic dysfunction Strict I's and O Daily weight  History of CVA with aphasia -per family, back to his baseline  Hyperglycemia Last hemoglobin A1c 6.0 from 01/06/2015 hemoglobin A1c: 5.8 -blood sugars improved with d/c SSI      Family Communication/Anticipated D/C date and plan/Code Status   DVT prophylaxis: heparin Code Status: Full Code.  Family Communication: wife/granddaughter at bedside Disposition Plan: home 24-48 hours   Medical Consultants:    None.    Subjective:   No chest pain, no cough-- wants to eat his breakfast  Objective:     Vitals:   08/22/17 0401 08/22/17 0619 08/22/17 0745 08/22/17 0800  BP: (!) 94/53   119/79  Pulse: 83   76  Resp: 12     Temp: 100.2 F (37.9 C)   98 F (36.7 C)  TempSrc: Rectal   Oral  SpO2: 95%   99%  Weight:  58.5 kg (129 lb)    Height:   5\' 5"  (1.651 m)     Intake/Output Summary (Last 24 hours) at 08/22/2017 1207 Last data filed at 08/22/2017 0920 Gross per 24 hour  Intake 2534.82 ml  Output 550 ml  Net 1984.82 ml   Filed Weights   08/22/17 0619  Weight: 58.5 kg (129 lb)    Exam: In bed, NAD Diminished, no wheezing,-- not on O2 +BS, soft Alert, pleasant/cooperative  Data Reviewed:   I have personally reviewed following labs and imaging studies:  Labs: Labs show the following:   Basic Metabolic Panel: Recent Labs  Lab 08/21/17 1251 08/22/17 0633  NA 141 138  K 4.5 4.1  CL 108 108  CO2 24 23  GLUCOSE 163* 94  BUN 24* 21  CREATININE 1.77* 1.57*  CALCIUM 8.7* 8.4*   GFR Estimated Creatinine Clearance: 20.7 mL/min (A) (by C-G formula based on SCr of 1.57 mg/dL (H)). Liver Function Tests: Recent Labs  Lab 08/21/17 1251  AST 18  ALT 14  ALKPHOS 75  BILITOT 0.7  PROT 5.6*  ALBUMIN 3.0*   No results for input(s): LIPASE, AMYLASE in the last 168 hours. No results for input(s): AMMONIA in the last 168 hours. Coagulation profile No results for input(s): INR, PROTIME in  the last 168 hours.  CBC: Recent Labs  Lab 08/21/17 1251 08/22/17 0633  WBC 6.6 8.9  NEUTROABS 5.7  --   HGB 10.2* 9.8*  HCT 32.0* 29.5*  MCV 97.3 93.4  PLT 168 163   Cardiac Enzymes: Recent Labs  Lab 08/21/17 1251 08/21/17 1907 08/21/17 2145  TROPONINI 0.03* 0.04* 0.04*   BNP (last 3 results) No results for input(s): PROBNP in the last 8760 hours. CBG: Recent Labs  Lab 08/21/17 2257 08/22/17 0753  GLUCAP 140* 86   D-Dimer: Recent Labs    08/21/17 1907  DDIMER 8.62*   Hgb A1c: Recent Labs    08/21/17 1907  HGBA1C 5.8*   Lipid Profile: Recent  Labs    08/21/17 1907  CHOL 211*  HDL 68  LDLCALC 136*  TRIG 34  CHOLHDL 3.1   Thyroid function studies: No results for input(s): TSH, T4TOTAL, T3FREE, THYROIDAB in the last 72 hours.  Invalid input(s): FREET3 Anemia work up: No results for input(s): VITAMINB12, FOLATE, FERRITIN, TIBC, IRON, RETICCTPCT in the last 72 hours. Sepsis Labs: Recent Labs  Lab 08/21/17 1251 08/22/17 0633  WBC 6.6 8.9    Microbiology No results found for this or any previous visit (from the past 240 hour(s)).  Procedures and diagnostic studies:  Ct Head Wo Contrast  Result Date: 08/21/2017 CLINICAL DATA:  One 84 year old male became weak and dizzy at home. No injury. Initial encounter. EXAM: CT HEAD WITHOUT CONTRAST TECHNIQUE: Contiguous axial images were obtained from the base of the skull through the vertex without intravenous contrast. COMPARISON:  07/14/2016 CT. FINDINGS: Brain: No intracranial hemorrhage or CT evidence of large acute infarct. Remote moderately large posterior left frontal-parietal lobe infarct with encephalomalacia. Marked chronic microvascular changes. Moderate to marked global atrophy. No intracranial mass lesion noted on this unenhanced exam. Vascular: Vascular calcifications Skull: No acute abnormality. Sinuses/Orbits: No acute orbital abnormality. Visualized paranasal sinuses clear. Other: Opacification left mastoid air cells and middle ear cavity without obstructing lesion of the eustachian tube noted. Transverse ligament hypertrophy with mild narrowing ventral aspect upper cervical canal. IMPRESSION: No acute intracranial abnormality. Remote moderately large posterior left frontal-parietal lobe infarct. Marked chronic microvascular changes. Moderate to marked global atrophy. Opacification left mastoid air cells and middle ear cavity without obstructing lesion of the eustachian tube noted. Electronically Signed   By: Genia Del M.D.   On: 08/21/2017 14:44   Dg Chest  Portable 1 View  Result Date: 08/21/2017 CLINICAL DATA:  Weak and dizzy, hypertension, smoker EXAM: PORTABLE CHEST 1 VIEW COMPARISON:  03/03/2017 FINDINGS: Background COPD/emphysema noted. Slight worsening left perihilar and bibasilar bronchovascular opacities concerning for pneumonia. Negative for edema, effusion or pneumothorax. Trachea is midline. Aorta is atherosclerotic and tortuous. Heart is mildly enlarged. Normal bowel gas pattern. Monitor leads overlie the chest. IMPRESSION: Slight worsening left perihilar and bibasilar bronchovascular opacities concerning for superimposed pneumonia on background COPD. Atherosclerosis Electronically Signed   By: Jerilynn Mages.  Shick M.D.   On: 08/21/2017 17:46    Medications:   . amLODipine  10 mg Oral Daily  . aspirin  325 mg Oral Daily  . doxazosin  1 mg Oral QHS  . heparin injection (subcutaneous)  5,000 Units Subcutaneous Q8H  . insulin aspart  0-9 Units Subcutaneous TID WC   Continuous Infusions: . sodium chloride Stopped (08/22/17 1025)  . azithromycin    . cefTRIAXone (ROCEPHIN)  IV       LOS: 0 days   Geradine Girt  [facility]   *  Please refer to [email or web address], password [email or web address] to get updated schedule on who will round on this patient, as hospitalists switch teams weekly. If 7PM-7AM, please contact night-coverage at [email or web address], password [email or web address] for any overnight needs.  08/22/2017, 12:07 PM

## 2017-08-22 NOTE — Care Management Note (Addendum)
Case Management Note  Patient Details  Name: Howard Padilla MRN: [id number] Date of Birth: 1915-12-28  Subjective/Objective:  Postural Dizziness                 Action/Plan: Patient lives at home with spouse; PCP: [name], MD. CM talked to spouse and was informed that they received a letter from the PCP that he was retiring and needs to find a new PCP; grand daughter is working with CM to find a new PCP; has [employer] with Medicare; pharmacy of choice is [facility]; DME - cane at home, he is requesting a walker with seat ( rollater ) at discharge; patient could benefit from a disease management program/ medication management; [place] choice offered, family chose [facility]; [place] with [facility] called for arrangements. CM will continue to follow for progression of care.  12:57 pm- Family member gave CM letter from PCP - Dr Burnice Logan last day is Sept 19,2019; follow up apt made with Dr Burnice Logan fo August 28, 2017 at 2:45 pm. Mindi Slicker [facility]   Expected Discharge Date:     Possibly 08/23/2017             Expected Discharge Plan:  [place]  In-House Referral:   [facility] [facility]  Discharge planning Services  CM Consult  Choice offered to:  Patient, Spouse  HH Arranged:  RN, Disease Management, PT, Nurse's Aide [place] Agency:  [facility]  Status of Service:  In process, will continue to follow  Sherrilyn Rist [phone] 08/22/2017, 10:24 AM

## 2017-08-22 NOTE — Progress Notes (Signed)
Patient returns from nuclear medicine with leaking condom cath.   Pt cleaned up. Family arrives back to bedside, [facility] forms signed.   IV extension added to new IV site by nuc med. IV charting updated.

## 2017-08-22 NOTE — Evaluation (Signed)
Physical Therapy Evaluation Patient Details Name: Howard Padilla MRN: [id number] DOB: 04/27/1915 Today's Date: 08/22/2017   History of Present Illness  Pt is a 84 y.o. M with significant PMH of prior CVA with residual aphasia, dementia, chronic normocytic anemia, CKD 3, HTN, who presents after nearly passing out at home. CT head (-) acute intracranial abnormality.  Clinical Impression  Pt admitted with above diagnosis. Pt currently with functional limitations due to the deficits listed below (see PT Problem List). Patient has dementia at baseline, dependent with ADL's, and uses a SPC for mobility. Has 24/7 assistance at home. On PT evaluation, patient ambulating 200 feet with Rollator and min guard assist. Patient seems to be at baseline based on patient wife and granddaughter's report. Has no history of previous falls. Pt will benefit from skilled PT to increase their independence and safety with mobility to allow discharge to the venue listed below.       Follow Up Recommendations No PT follow up;Supervision/Assistance - 24 hour    Equipment Recommendations  Other (comment)(Rollator (already delivered))    Recommendations for Other Services       Precautions / Restrictions Precautions Precautions: Fall Restrictions Weight Bearing Restrictions: No      Mobility  Bed Mobility Overal bed mobility: Modified Independent                Transfers Overall transfer level: Needs assistance Equipment used: Rolling walker (2 wheeled) Transfers: Sit to/from Stand Sit to Stand: Supervision            Ambulation/Gait Ambulation/Gait assistance: Min guard Gait Distance (Feet): 250 Feet Assistive device: 4-wheeled walker Gait Pattern/deviations: Step-through pattern;Trunk flexed;Decreased stride length     General Gait Details: patient requiring cueing for rollator proximity and upright  posture  Stairs            Wheelchair Mobility    Modified Rankin (Stroke  Patients Only)       Balance Overall balance assessment: Needs assistance Sitting-balance support: No upper extremity supported;Feet supported Sitting balance-Leahy Scale: Good     Standing balance support: Bilateral upper extremity supported Standing balance-Leahy Scale: Fair                               Pertinent Vitals/Pain Pain Assessment: No/denies pain    Home Living Family/patient expects to be discharged to:: Private residence Living Arrangements: Spouse/significant other Available Help at Discharge: Family;Available 24 hours/day Type of Home: House Home Access: Level entry     Home Layout: One level Home Equipment: Shower seat;Cane - single point      Prior Function Level of Independence: Needs assistance   Gait / Transfers Assistance Needed: uses SPC  ADL's / Homemaking Assistance Needed: patient wife and son assists with all ADL's        Hand Dominance   Dominant Hand: Right    Extremity/Trunk Assessment   Upper Extremity Assessment Upper Extremity Assessment: Overall WFL for tasks assessed    Lower Extremity Assessment Lower Extremity Assessment: Overall WFL for tasks assessed    Cervical / Trunk Assessment Cervical / Trunk Assessment: Kyphotic  Communication   Communication: No difficulties  Cognition Arousal/Alertness: Awake/alert Behavior During Therapy: WFL for tasks assessed/performed Overall Cognitive Status: History of cognitive impairments - at baseline  General Comments: has dementia; follows simple commands consistently. A&O x 1      General Comments      Exercises     Assessment/Plan    PT Assessment Patient needs continued PT services  PT Problem List Decreased strength;Decreased activity tolerance;Decreased balance;Decreased mobility;Decreased cognition;Decreased safety awareness       PT Treatment Interventions Gait training;Functional mobility  training;Therapeutic activities;Therapeutic exercise;Balance training;Patient/family education    PT Goals (Current goals can be found in the Care Plan section)  Acute Rehab PT Goals Patient Stated Goal: none stated PT Goal Formulation: Patient unable to participate in goal setting    Frequency Min 3X/week   Barriers to discharge        Co-evaluation               AM-PAC PT "6 Clicks" Daily Activity  Outcome Measure Difficulty turning over in bed (including adjusting bedclothes, sheets and blankets)?: None Difficulty moving from lying on back to sitting on the side of the bed? : None Difficulty sitting down on and standing up from a chair with arms (e.g., wheelchair, bedside commode, etc,.)?: A Little Help needed moving to and from a bed to chair (including a wheelchair)?: A Little Help needed walking in hospital room?: A Little Help needed climbing 3-5 steps with a railing? : A Lot 6 Click Score: 19    End of Session Equipment Utilized During Treatment: Gait belt Activity Tolerance: Patient tolerated treatment well Patient left: in bed;with call bell/phone within reach;with bed alarm set Nurse Communication: Mobility status PT Visit Diagnosis: Unsteadiness on feet (R26.81);Difficulty in walking, not elsewhere classified (R26.2)    Time: 6433-2951 PT Time Calculation (min) (ACUTE ONLY): 16 min   Charges:   PT Evaluation $PT Eval Low Complexity: 1 Low     PT G Codes:       Ellamae Sia, PT, DPT Acute Rehabilitation Services  Pager: [phone]   Willy Eddy 08/22/2017, 3:50 PM

## 2017-08-22 NOTE — Progress Notes (Signed)
Patient resting comfortably during shift report. Denies complaints.   Call placed to [facility], they anticipate pt going for scan at approximately 0945. They deny need for new CXR.

## 2017-08-22 NOTE — Progress Notes (Signed)
Temp 100.2 orally. Rectal temp = 101.3 upon reassessment. D-dimer = 8.62. MD paged. Will continue to monitor.

## 2017-08-22 NOTE — Progress Notes (Addendum)
Patient pending VQ scan this morning. Will hold abx (as to not occupy IV access) and meds (to avoid risk for aspiration) until study is performed.    Patients family reports personal diet preference in regard to not consuming pork; they deny religious purpose for not consuming pork. Family is agreeable to heparin.

## 2017-08-22 NOTE — Progress Notes (Signed)
Patient's condom cath came off again. Will replace with a smaller size to see if there is more success.

## 2017-08-22 NOTE — Progress Notes (Signed)
PT Cancellation Note  Patient Details Name: Howard Padilla MRN: [id number] DOB: 06-19-1915   Cancelled Treatment:    Reason Eval/Treat Not Completed: Patient at procedure or test/unavailable   Shary Decamp [facility] 08/22/2017, 11:52 AM Suanne Marker PT [phone]

## 2017-08-23 ENCOUNTER — Inpatient Hospital Stay (HOSPITAL_COMMUNITY): Payer: Medicare Other

## 2017-08-23 DIAGNOSIS — R0902 Hypoxemia: Secondary | ICD-10-CM

## 2017-08-23 DIAGNOSIS — I361 Nonrheumatic tricuspid (valve) insufficiency: Secondary | ICD-10-CM

## 2017-08-23 DIAGNOSIS — I340 Nonrheumatic mitral (valve) insufficiency: Secondary | ICD-10-CM

## 2017-08-23 LAB — COMPREHENSIVE METABOLIC PANEL
ALBUMIN: 2.8 g/dL — AB (ref 3.5–5.0)
ALK PHOS: 75 U/L (ref 38–126)
ALT: 17 U/L (ref 0–44)
ANION GAP: 11 (ref 5–15)
AST: 19 U/L (ref 15–41)
BILIRUBIN TOTAL: 0.5 mg/dL (ref 0.3–1.2)
BUN: 21 mg/dL (ref 8–23)
CO2: 23 mmol/L (ref 22–32)
CREATININE: 1.43 mg/dL — AB (ref 0.61–1.24)
Calcium: 8.4 mg/dL — ABNORMAL LOW (ref 8.9–10.3)
Chloride: 105 mmol/L (ref 98–111)
GFR calc Af Amer: 45 mL/min — ABNORMAL LOW (ref >60)
GFR calc non Af Amer: 39 mL/min — ABNORMAL LOW (ref >60)
GLUCOSE: 96 mg/dL (ref 70–99)
Potassium: 4.2 mmol/L (ref 3.5–5.1)
SODIUM: 139 mmol/L (ref 135–145)
TOTAL PROTEIN: 5.7 g/dL — AB (ref 6.5–8.1)

## 2017-08-23 LAB — URINE CULTURE: Culture: <10000 — AB

## 2017-08-23 LAB — ECHOCARDIOGRAM COMPLETE
Height: 65 in
Weight: 1996.8 oz

## 2017-08-23 NOTE — Progress Notes (Addendum)
Progress Note    Howard Padilla  WIO:[id number] DOB: [birth year]  DOA: 08/21/2017 PCP: [name], MD    Brief Narrative:     Medical records reviewed and are as summarized below:  Howard Padilla is an 84 y.o. male with medical history significant for prior CVA with residual aphasia, at baseline ambulatory with no assist, dementia, chronic normocytic anemia, CKD 3, HTN, who presented to ED [facility] after nearly passing out at home.  Found to have a fever with pneumonia and an elevated d dimer with V/Q scan  negative for PE, anticipate discharge tomorrow    Assessment/Plan:   Active Problems:   Postural dizziness with presyncope  Fever with pneumonia on chest x ray -no recent hospitalizations -IV abx -blood cultures NGSF  Elevated d dimer -V/Q scan negative   Postural dizziness with presyncope CT head with no contrast revealed no acute intracranial abnormalities Per ED physician at baseline walking independently without assist PT evaluation -24/7 Orthostatic vital signs negative U/a negative   Acute hypoxia, unclear etiology -? PNA,  -not on O2 currently -V/Q to r/o PE was negative   AKI on CKD 3 Creatinine 1.77 Baseline creatinine 1.4, will repeat  Avoid nephrotoxic agents/hypotension/dehydration improved with IVF  Chronic diastolic CHF Last 2D echo done on 01/07/2015 revealed LVEF 55 to 60% with no diagnostic regional wall motion abnormality and grade 1 diastolic dysfunction Strict I's and O Daily weight Repeat echo to evaluate EF   History of CVA with aphasia -per family, back to his baseline Asprin   Hyperglycemia Last hemoglobin A1c 6.0 from 01/06/2015 hemoglobin A1c: 5.8 blood sugars improved with d/c SSI      Family Communication/Anticipated D/C date and plan/Code Status   DVT prophylaxis: heparin Code Status: Full Code.  Family Communication: wife/granddaughter at bedside Disposition Plan: home likely in am    Medical  Consultants:    None.    Subjective:   Slightly hypertensive, denies any chest pain or shortness of breath  Objective:    Vitals:   08/23/17 0116 08/23/17 0652 08/23/17 1008 08/23/17 1236  BP: (!) 180/95 (!) 154/99  (!) 148/92  Pulse:  100  95  Resp:  16  17  Temp:  98.7 F (37.1 C)  (!) 97.4 F (36.3 C)  TempSrc:  Oral  Oral  SpO2:  100%  99%  Weight:   56.6 kg (124 lb 12.8 oz)   Height:        Intake/Output Summary (Last 24 hours) at 08/23/2017 1310 Last data filed at 08/23/2017 0843 Gross per 24 hour  Intake 980.27 ml  Output 1650 ml  Net -669.73 ml   Filed Weights   08/22/17 0619 08/23/17 1008  Weight: 58.5 kg (129 lb) 56.6 kg (124 lb 12.8 oz)    Exam: In bed, NAD Diminished, no wheezing,-- not on O2 +BS, soft Alert, pleasant/cooperative  Data Reviewed:   I have personally reviewed following labs and imaging studies:  Labs: Labs show the following:   Basic Metabolic Panel: Recent Labs  Lab 08/21/17 1251 08/22/17 0633  NA 141 138  K 4.5 4.1  CL 108 108  CO2 24 23  GLUCOSE 163* 94  BUN 24* 21  CREATININE 1.77* 1.57*  CALCIUM 8.7* 8.4*   GFR Estimated Creatinine Clearance: 20 mL/min (A) (by C-G formula based on SCr of 1.57 mg/dL (H)). Liver Function Tests: Recent Labs  Lab 08/21/17 1251  AST 18  ALT 14  ALKPHOS 75  BILITOT 0.7  PROT  5.6*  ALBUMIN 3.0*   No results for input(s): LIPASE, AMYLASE in the last 168 hours. No results for input(s): AMMONIA in the last 168 hours. Coagulation profile No results for input(s): INR, PROTIME in the last 168 hours.  CBC: Recent Labs  Lab 08/21/17 1251 08/22/17 0633  WBC 6.6 8.9  NEUTROABS 5.7  --   HGB 10.2* 9.8*  HCT 32.0* 29.5*  MCV 97.3 93.4  PLT 168 163   Cardiac Enzymes: Recent Labs  Lab 08/21/17 1251 08/21/17 1907 08/21/17 2145  TROPONINI 0.03* 0.04* 0.04*   BNP (last 3 results) No results for input(s): PROBNP in the last 8760 hours. CBG: Recent Labs  Lab 08/21/17 2257  08/22/17 0753 08/22/17 1212 08/22/17 1600  GLUCAP 140* 86 96 136*   D-Dimer: Recent Labs    08/21/17 1907  DDIMER 8.62*   Hgb A1c: Recent Labs    08/21/17 1907  HGBA1C 5.8*   Lipid Profile: Recent Labs    08/21/17 1907  CHOL 211*  HDL 68  LDLCALC 136*  TRIG 34  CHOLHDL 3.1   Thyroid function studies: No results for input(s): TSH, T4TOTAL, T3FREE, THYROIDAB in the last 72 hours.  Invalid input(s): FREET3 Anemia work up: No results for input(s): VITAMINB12, FOLATE, FERRITIN, TIBC, IRON, RETICCTPCT in the last 72 hours. Sepsis Labs: Recent Labs  Lab 08/21/17 1251 08/22/17 0633  WBC 6.6 8.9    Microbiology Recent Results (from the past 240 hour(s))  Urine Culture     Status: Abnormal   Collection Time: 08/21/17  5:25 PM  Result Value Ref Range Status   Specimen Description URINE, CLEAN CATCH  Final   Special Requests NONE  Final   Culture (A)  Final    <10,000 COLONIES/mL INSIGNIFICANT GROWTH Performed at [facility], [place] [address], [place], [place] [address]    Report Status 08/23/2017 FINAL  Final  Culture, blood (routine x 2) Call MD if unable to obtain prior to antibiotics being given     Status: None (Preliminary result)   Collection Time: 08/22/17  8:45 AM  Result Value Ref Range Status   Specimen Description BLOOD RIGHT ANTECUBITAL  Final   Special Requests   Final    BOTTLES DRAWN AEROBIC AND ANAEROBIC Blood Culture adequate volume   Culture   Final    NO GROWTH 1 DAY Performed at [facility], [place] [address], [place], [place] [address]    Report Status PENDING  Incomplete  Culture, blood (routine x 2) Call MD if unable to obtain prior to antibiotics being given     Status: None (Preliminary result)   Collection Time: 08/22/17  8:55 AM  Result Value Ref Range Status   Specimen Description BLOOD LEFT HAND  Final   Special Requests   Final    BOTTLES DRAWN AEROBIC ONLY Blood Culture adequate volume   Culture   Final    NO GROWTH  1 DAY Performed at [facility], [address], [place],  [address]    Report Status PENDING  Incomplete    Procedures and diagnostic studies:  Ct Head Wo Contrast  Result Date: 08/21/2017 CLINICAL DATA:  One 84 year old male became weak and dizzy at home. No injury. Initial encounter. EXAM: CT HEAD WITHOUT CONTRAST TECHNIQUE: Contiguous axial images were obtained from the base of the skull through the vertex without intravenous contrast. COMPARISON:  07/14/2016 CT. FINDINGS: Brain: No intracranial hemorrhage or CT evidence of large acute infarct. Remote moderately  large posterior left frontal-parietal lobe infarct with encephalomalacia. Marked chronic microvascular changes. Moderate to marked global atrophy. No intracranial mass lesion noted on this unenhanced exam. Vascular: Vascular calcifications Skull: No acute abnormality. Sinuses/Orbits: No acute orbital abnormality. Visualized paranasal sinuses clear. Other: Opacification left mastoid air cells and middle ear cavity without obstructing lesion of the eustachian tube noted. Transverse ligament hypertrophy with mild narrowing ventral aspect upper cervical canal. IMPRESSION: No acute intracranial abnormality. Remote moderately large posterior left frontal-parietal lobe infarct. Marked chronic microvascular changes. Moderate to marked global atrophy. Opacification left mastoid air cells and middle ear cavity without obstructing lesion of the eustachian tube noted. Electronically Signed   By: Genia Del M.D.   On: 08/21/2017 14:44   Nm Pulmonary Perf And Vent  Result Date: 08/23/2017 CLINICAL DATA:  Fever and pneumonia.  Elevated D-dimer EXAM: NUCLEAR MEDICINE VENTILATION - PERFUSION LUNG SCAN TECHNIQUE: Ventilation images were obtained in multiple projections using inhaled aerosol Tc-46m DTPA. Perfusion images were obtained in multiple projections after intravenous injection of Tc-71m-MAA. RADIOPHARMACEUTICALS:  31.0 mCi of  Tc-5m DTPA aerosol inhalation and 4.0 mCi Tc78m-MAA IV COMPARISON:  Chest x-ray August 21, 2017 FINDINGS: Ventilation: Multiple matched defects. Perfusion: Multiple matched defects. IMPRESSION: Very low probability V/Q scan. Electronically Signed   By: Dorise Bullion III M.D   On: 08/23/2017 10:58   Dg Chest Portable 1 View  Result Date: 08/21/2017 CLINICAL DATA:  Weak and dizzy, hypertension, smoker EXAM: PORTABLE CHEST 1 VIEW COMPARISON:  03/03/2017 FINDINGS: Background COPD/emphysema noted. Slight worsening left perihilar and bibasilar bronchovascular opacities concerning for pneumonia. Negative for edema, effusion or pneumothorax. Trachea is midline. Aorta is atherosclerotic and tortuous. Heart is mildly enlarged. Normal bowel gas pattern. Monitor leads overlie the chest. IMPRESSION: Slight worsening left perihilar and bibasilar bronchovascular opacities concerning for superimposed pneumonia on background COPD. Atherosclerosis Electronically Signed   By: Jerilynn Mages.  Shick M.D.   On: 08/21/2017 17:46    Medications:   . amLODipine  10 mg Oral Daily  . aspirin  325 mg Oral Daily  . doxazosin  1 mg Oral QHS  . heparin injection (subcutaneous)  5,000 Units Subcutaneous Q8H   Continuous Infusions: . sodium chloride 100 mL/hr at 08/23/17 0901  . azithromycin 500 mg (08/23/17 1009)  . cefTRIAXone (ROCEPHIN)  IV 1 g (08/23/17 0905)     LOS: 1 day   Howard Padilla  [facility]   *Please refer to [email or web address], password [email or web address] to get updated schedule on who will round on this patient, as hospitalists switch teams weekly. If 7PM-7AM, please contact night-coverage at [email or web address], password [email or web address] for any overnight needs.  08/23/2017, 1:10 PM

## 2017-08-23 NOTE — Progress Notes (Signed)
  Echocardiogram 2D Echocardiogram has been performed.  Howard Padilla 08/23/2017, 4:47 PM

## 2017-08-24 DIAGNOSIS — R55 Syncope and collapse: Secondary | ICD-10-CM

## 2017-08-24 DIAGNOSIS — R42 Dizziness and giddiness: Secondary | ICD-10-CM

## 2017-08-24 LAB — BASIC METABOLIC PANEL
ANION GAP: 8 (ref 5–15)
BUN: 22 mg/dL (ref 8–23)
CHLORIDE: 106 mmol/L (ref 98–111)
CO2: 24 mmol/L (ref 22–32)
CREATININE: 1.43 mg/dL — AB (ref 0.61–1.24)
Calcium: 8.7 mg/dL — ABNORMAL LOW (ref 8.9–10.3)
GFR calc non Af Amer: 39 mL/min — ABNORMAL LOW (ref >60)
GFR, EST AFRICAN AMERICAN: 45 mL/min — AB (ref >60)
Glucose, Bld: 75 mg/dL (ref 70–99)
POTASSIUM: 4.2 mmol/L (ref 3.5–5.1)
SODIUM: 138 mmol/L (ref 135–145)

## 2017-08-24 LAB — CBC
HEMATOCRIT: 34.1 % — AB (ref 39.0–52.0)
HEMOGLOBIN: 11 g/dL — AB (ref 13.0–17.0)
MCH: 30.4 pg (ref 26.0–34.0)
MCHC: 32.3 g/dL (ref 30.0–36.0)
MCV: 94.2 fL (ref 78.0–100.0)
Platelets: 183 10*3/uL (ref 150–400)
RBC: 3.62 MIL/uL — AB (ref 4.22–5.81)
RDW: 15 % (ref 11.5–15.5)
WBC: 4.6 10*3/uL (ref 4.0–10.5)

## 2017-08-24 MED ORDER — METOPROLOL TARTRATE 12.5 MG HALF TABLET
12.5000 mg | ORAL_TABLET | Freq: Two times a day (BID) | ORAL | Status: DC
  Administered 2017-08-24 – 2017-08-25 (×2): 12.5 mg via ORAL
  Filled 2017-08-24 (×2): qty 1

## 2017-08-24 MED ORDER — ASPIRIN 81 MG PO CHEW
81.0000 mg | CHEWABLE_TABLET | Freq: Every day | ORAL | Status: DC
  Administered 2017-08-24 – 2017-08-25 (×2): 81 mg via ORAL
  Filled 2017-08-24 (×2): qty 1

## 2017-08-24 MED ORDER — GUAIFENESIN 100 MG/5ML PO SOLN
20.0000 mL | Freq: Three times a day (TID) | ORAL | Status: DC
  Administered 2017-08-24 – 2017-08-25 (×3): 400 mg via ORAL
  Filled 2017-08-24 (×3): qty 5
  Filled 2017-08-24 (×3): qty 15

## 2017-08-24 MED ORDER — SODIUM CHLORIDE 0.9 % IV SOLN
INTRAVENOUS | Status: DC
  Administered 2017-08-24: 19:00:00 via INTRAVENOUS

## 2017-08-24 NOTE — Progress Notes (Signed)
Physical Therapy Treatment Patient Details Name: Howard Padilla MRN: [id number] DOB: Oct 30, 1915 Today's Date: 08/24/2017    History of Present Illness Pt is a 84 y.o. M with significant PMH of prior CVA with residual aphasia, dementia, chronic normocytic anemia, CKD 3, HTN, who presents after nearly passing out at home. CT head (-) acute intracranial abnormality.    PT Comments    Session focused on gait training with rollator, cueing for posture and proximity to maximize safety. Pt unable to follow cues consistently and will benefit from continued therapy after d/c at home setting to improve mechanics and balance. Updated recs for HHPT. No family present to discuss, pt fall risk and will need light physical assistance 24/7 with OOB mobility to ensure safety. Min guard level for 200' of gait today with rollator.     Follow Up Recommendations  Home health PT;Supervision/Assistance - 24 hour     Equipment Recommendations  Other (comment)(Rollator already delivered)    Recommendations for Other Services       Precautions / Restrictions Precautions Precautions: Fall Restrictions Weight Bearing Restrictions: No    Mobility  Bed Mobility Overal bed mobility: Modified Independent                Transfers Overall transfer level: Needs assistance Equipment used: Rolling walker (2 wheeled) Transfers: Sit to/from Stand Sit to Stand: Min assist         General transfer comment: min A this visit to help power up from normal height bed.   Ambulation/Gait Ambulation/Gait assistance: Min guard Gait Distance (Feet): 175 Feet Assistive device: 4-wheeled walker Gait Pattern/deviations: Step-through pattern;Trunk flexed;Decreased stride length Gait velocity: decreased   General Gait Details: Pt with intermittent steppage pattern, allows rollator to travel too far infront of him, walks with flexed trunk posture. cues to correct although unable to consistenly follow.     Stairs             Wheelchair Mobility    Modified Rankin (Stroke Patients Only)       Balance Overall balance assessment: Needs assistance Sitting-balance support: No upper extremity supported Sitting balance-Leahy Scale: Good     Standing balance support: Bilateral upper extremity supported Standing balance-Leahy Scale: Fair                              Cognition Arousal/Alertness: Awake/alert Behavior During Therapy: WFL for tasks assessed/performed Overall Cognitive Status: History of cognitive impairments - at baseline                                 General Comments: has dementia; follows simple commands consistently. A&O x 1      Exercises      General Comments        Pertinent Vitals/Pain Pain Assessment: No/denies pain    Home Living                      Prior Function            PT Goals (current goals can now be found in the care plan section) Acute Rehab PT Goals Patient Stated Goal: none stated PT Goal Formulation: Patient unable to participate in goal setting Progress towards PT goals: Progressing toward goals    Frequency    Min 3X/week      PT Plan Discharge plan needs to be updated  Co-evaluation              AM-PAC PT "6 Clicks" Daily Activity  Outcome Measure  Difficulty turning over in bed (including adjusting bedclothes, sheets and blankets)?: None Difficulty moving from lying on back to sitting on the side of the bed? : None Difficulty sitting down on and standing up from a chair with arms (e.g., wheelchair, bedside commode, etc,.)?: A Little Help needed moving to and from a bed to chair (including a wheelchair)?: A Little Help needed walking in hospital room?: A Little Help needed climbing 3-5 steps with a railing? : A Lot 6 Click Score: 19    End of Session Equipment Utilized During Treatment: Gait belt Activity Tolerance: Patient tolerated treatment well Patient  left: in bed;with call bell/phone within reach;with bed alarm set Nurse Communication: Mobility status PT Visit Diagnosis: Unsteadiness on feet (R26.81);Difficulty in walking, not elsewhere classified (R26.2)     Time: 5188-4166 PT Time Calculation (min) (ACUTE ONLY): 20 min  Charges:  $Gait Training: 8-22 mins                    G Codes:       Reinaldo Berber, PT, DPT Acute Rehab Services Pager: [phone]     Reinaldo Berber 08/24/2017, 9:36 AM

## 2017-08-24 NOTE — Consult Note (Signed)
            [facility] CM Primary Care Navigator  08/24/2017  Howard Padilla 1915/04/22 [id number]   Went to see patient at the bedside (unable to answer questions appropriately- dementia) and spoke with wife over the phone to identify possible discharge needs. Permission was obtained from patient to talk to his wife Howard Padilla).  Per wife, patient had weakness with increased difficulty walking. He presented with complaint of dizziness and nearly passing out, and was found to have fever with pneumonia that had led to this admission.    Patient's wife endorsesDr. Bluford Kaufmann with [employer] [facility] [place] as theprimary care provider.   [facility] [facility].   Patient's wife statesthat she manageshis medications at home using "pill box system" filled once a week.  Per wife, she has been driving and providing transportation to patient's doctors'appointments.  Wife verbalized that she is the primary caregiver for patient at home.  According to wife, anticipated discharge plan ishome with home health services ([facility]) arranged by Inpatient CM.   Patient's wife voiced understanding to call primary care provider's office whenhereturns home for a post discharge follow-up appointment within1- 2 weeksor sooner if needs arise.Patient letter (with PCP's contact number) was provided at the bedside asareminder.  Explained to wife regardingTHN CM services available for health management and resourcesat home butshe repeatedly denies any current concerns orneeds at this point (stating "I know to contact his doctor if there are any needs and he will provide me information and instructions on what to do"). Wife adamantly declinedTHN services which include EMMI calls to follow-up with hisrecovery. Wife mentioned that she has been managing patient's health needs at home.  Patient's wife was  encouraged to [facility] primary care providerto [facility] care management as deemed necessary and appropriate for anyservicesin thenear future.   [facility] care management information provided for future needs that may arise.  Primary care provider's office is listed as providing transition of care (TOC) follow-up.    For additional questions please contact:  Edwena Felty A. Abbeygail Igoe, BSN, RN-BC [facility] PRIMARY CARE Navigator Cell: [phone]

## 2017-08-24 NOTE — Progress Notes (Signed)
Progress Note    Howard Padilla  WJX:[id number] DOB: [birth year]  DOA: 08/21/2017 PCP: [name], MD    Brief Narrative:     Medical records reviewed and are as summarized below:  Howard Padilla is an 84 y.o. male with medical history significant for prior CVA with residual aphasia, at baseline ambulatory with no assist, dementia, chronic normocytic anemia, CKD 3, HTN, who presented to ED [facility] after nearly passing out at home.  Found to have a fever with pneumonia and an elevated d dimer with V/Q scan  negative for PE,    Assessment/Plan:   Active Problems:   Postural dizziness with presyncope  1)Community acquired Pneumonia--- patient was admitted with fevers, hypoxia and radiological findings suggestive of pneumonia, Rocephin/azithromycin, continue bronchodilators, mucolytics, blood cultures negative so far, hypoxia appears to be resolving   2)Dizziness/Presyncope--  d-dimer was elevated however VQ scan low probability for PE, CT head without acute findings, patient is not orthostatic   3)Generalized weakness/debility--- PT eval appreciated patient will need home health rehab  4)AKI----acute kidney injury on CKD stage - III    creatinine on admission=  1.77,   baseline creatinine = 1.4   , creatinine is now= 1.4      ,  Avoid nephrotoxic agents/dehydration/hypotension   5)HFpEF--- H/o Chronic diastolic CHF,  No Acute Exacer, Echo with preserved EF over 60 %,  with no diagnostic regional wall motion abnormality and  diastolic dysfunction, Daily weight   6)History of CVA with aphasia-  C/n Aspirin  7)Hyperglycemia-hemoglobin A1c: 5.8, blood sugars improved with d/c SSI  8)HTN- BP is not at goal, add metoprolol 2.5 mg twice daily, continue amlodipine 10 mg daily, Cardura    Family Communication/Anticipated D/C date and plan/Code Status   DVT prophylaxis: heparin Code Status: Full Code.  Family Communication: wife/granddaughter at bedside Disposition Plan:  home likely in am    Medical Consultants:    None.    Subjective:  Cough and shortness of breath continues to improve, oral intake improving,, wife and granddaughter at bedside  Objective:    Vitals:   08/23/17 1236 08/23/17 2047 08/24/17 0422 08/24/17 1139  BP: (!) 148/92 (!) 143/84 (!) 188/98 (!) 158/84  Pulse: 95 77 90 80  Resp: 17 18 18 16   Temp: (!) 97.4 F (36.3 C) 98.2 F (36.8 C) 98 F (36.7 C) 98.6 F (37 C)  TempSrc: Oral Oral Oral Oral  SpO2: 99% 95% 94% 98%  Weight:   56.4 kg (124 lb 6.4 oz)   Height:        Intake/Output Summary (Last 24 hours) at 08/24/2017 1658 Last data filed at 08/24/2017 1013 Gross per 24 hour  Intake 1626.83 ml  Output 550 ml  Net 1076.83 ml   Filed Weights   08/22/17 0619 08/23/17 1008 08/24/17 0422  Weight: 58.5 kg (129 lb) 56.6 kg (124 lb 12.8 oz) 56.4 kg (124 lb 6.4 oz)    Exam:   Physical Exam  Gen:- Awake Alert,  In no apparent distress  HEENT:- [place].AT, No sclera icterus Neck-Supple Neck,No JVD,.  Lungs-improving air movement, no wheezing  CV- S1, S2 normal Abd-  +ve B.Sounds, Abd Soft, No tenderness,    Extremity/Skin:- No  edema,   good pulses Psych-affect is appropriate, oriented x3 Neuro-no new focal deficits, no tremors   Data Reviewed:    Labs:  Labs show the following:   Basic Metabolic Panel: Recent Labs  Lab 08/21/17 1251 08/22/17 2130 08/23/17 1317 08/24/17 8657  NA 141 138 139 138  K 4.5 4.1 4.2 4.2  CL 108 108 105 106  CO2 24 23 23 24   GLUCOSE 163* 94 96 75  BUN 24* 21 21 22   CREATININE 1.77* 1.57* 1.43* 1.43*  CALCIUM 8.7* 8.4* 8.4* 8.7*   GFR Estimated Creatinine Clearance: 21.9 mL/min (A) (by C-G formula based on SCr of 1.43 mg/dL (H)). Liver Function Tests: Recent Labs  Lab 08/21/17 1251 08/23/17 1317  AST 18 19  ALT 14 17  ALKPHOS 75 75  BILITOT 0.7 0.5  PROT 5.6* 5.7*  ALBUMIN 3.0* 2.8*   No results for input(s): LIPASE, AMYLASE in the last 168 hours. No results  for input(s): AMMONIA in the last 168 hours. Coagulation profile No results for input(s): INR, PROTIME in the last 168 hours.  CBC: Recent Labs  Lab 08/21/17 1251 08/22/17 0633 08/24/17 0655  WBC 6.6 8.9 4.6  NEUTROABS 5.7  --   --   HGB 10.2* 9.8* 11.0*  HCT 32.0* 29.5* 34.1*  MCV 97.3 93.4 94.2  PLT 168 163 183   Cardiac Enzymes: Recent Labs  Lab 08/21/17 1251 08/21/17 1907 08/21/17 2145  TROPONINI 0.03* 0.04* 0.04*   BNP (last 3 results) No results for input(s): PROBNP in the last 8760 hours. CBG: Recent Labs  Lab 08/21/17 2257 08/22/17 0753 08/22/17 1212 08/22/17 1600  GLUCAP 140* 86 96 136*   D-Dimer: Recent Labs    08/21/17 1907  DDIMER 8.62*   Hgb A1c: Recent Labs    08/21/17 1907  HGBA1C 5.8*   Lipid Profile: Recent Labs    08/21/17 1907  CHOL 211*  HDL 68  LDLCALC 136*  TRIG 34  CHOLHDL 3.1   Thyroid function studies: No results for input(s): TSH, T4TOTAL, T3FREE, THYROIDAB in the last 72 hours.  Invalid input(s): FREET3 Anemia work up: No results for input(s): VITAMINB12, FOLATE, FERRITIN, TIBC, IRON, RETICCTPCT in the last 72 hours. Sepsis Labs: Recent Labs  Lab 08/21/17 1251 08/22/17 0633 08/24/17 0655  WBC 6.6 8.9 4.6    Microbiology Recent Results (from the past 240 hour(s))  Urine Culture     Status: Abnormal   Collection Time: 08/21/17  5:25 PM  Result Value Ref Range Status   Specimen Description URINE, CLEAN CATCH  Final   Special Requests NONE  Final   Culture (A)  Final    <10,000 COLONIES/mL INSIGNIFICANT GROWTH Performed at [facility], [place] [address], [place], [place] [address]    Report Status 08/23/2017 FINAL  Final  Culture, blood (routine x 2) Call MD if unable to obtain prior to antibiotics being given     Status: None (Preliminary result)   Collection Time: 08/22/17  8:45 AM  Result Value Ref Range Status   Specimen Description BLOOD RIGHT ANTECUBITAL  Final   Special Requests   Final     BOTTLES DRAWN AEROBIC AND ANAEROBIC Blood Culture adequate volume   Culture   Final    NO GROWTH 2 DAYS Performed at [facility], [place] [address], [place], [place] [address]    Report Status PENDING  Incomplete  Culture, blood (routine x 2) Call MD if unable to obtain prior to antibiotics being given     Status: None (Preliminary result)   Collection Time: 08/22/17  8:55 AM  Result Value Ref Range Status   Specimen Description BLOOD LEFT HAND  Final   Special Requests   Final    BOTTLES DRAWN AEROBIC ONLY Blood Culture adequate  volume   Culture   Final    NO GROWTH 2 DAYS Performed at [facility], [place] [address], [place], [place] [address]    Report Status PENDING  Incomplete    Procedures and diagnostic studies:  No results found.  Medications:   . amLODipine  10 mg Oral Daily  . aspirin  325 mg Oral Daily  . doxazosin  1 mg Oral QHS  . guaiFENesin  20 mL Oral TID  . heparin injection (subcutaneous)  5,000 Units Subcutaneous Q8H   Continuous Infusions: . sodium chloride 100 mL/hr at 08/23/17 0901  . azithromycin 500 mg (08/24/17 1242)  . cefTRIAXone (ROCEPHIN)  IV 1 g (08/24/17 1046)     LOS: 2 days   Azim Gillingham  [facility]   *Please refer to [email or web address], password [email or web address] to get updated schedule on who will round on this patient, as hospitalists switch teams weekly. If 7PM-7AM, please contact night-coverage at [email or web address], password [email or web address] for any overnight needs.  08/24/2017, 4:58 PM

## 2017-08-25 ENCOUNTER — Inpatient Hospital Stay (HOSPITAL_COMMUNITY): Payer: Medicare Other

## 2017-08-25 LAB — BASIC METABOLIC PANEL
Anion gap: 7 (ref 5–15)
BUN: 25 mg/dL — AB (ref 8–23)
CO2: 21 mmol/L — AB (ref 22–32)
CREATININE: 1.46 mg/dL — AB (ref 0.61–1.24)
Calcium: 8.2 mg/dL — ABNORMAL LOW (ref 8.9–10.3)
Chloride: 108 mmol/L (ref 98–111)
GFR calc Af Amer: 44 mL/min — ABNORMAL LOW (ref >60)
GFR calc non Af Amer: 38 mL/min — ABNORMAL LOW (ref >60)
GLUCOSE: 77 mg/dL (ref 70–99)
Potassium: 4.2 mmol/L (ref 3.5–5.1)
Sodium: 136 mmol/L (ref 135–145)

## 2017-08-25 MED ORDER — ASPIRIN 81 MG PO CHEW: 81.0000 mg | CHEWABLE_TABLET | Freq: Every day | ORAL | 1 refills | Status: DC

## 2017-08-25 MED ORDER — ACETAMINOPHEN 500 MG PO TABS: 1000.0000 mg | ORAL_TABLET | Freq: Four times a day (QID) | ORAL | 0 refills | Status: AC | PRN

## 2017-08-25 MED ORDER — CEFDINIR 300 MG PO CAPS: 300.0000 mg | ORAL_CAPSULE | Freq: Two times a day (BID) | ORAL | 0 refills | Status: AC

## 2017-08-25 MED ORDER — AMLODIPINE BESYLATE 10 MG PO TABS: 10.0000 mg | ORAL_TABLET | Freq: Every day | ORAL | 3 refills | Status: DC

## 2017-08-25 MED ORDER — AZITHROMYCIN 500 MG PO TABS
500.0000 mg | ORAL_TABLET | Freq: Every day | ORAL | Status: DC
  Administered 2017-08-25: 500 mg via ORAL
  Filled 2017-08-25: qty 1

## 2017-08-25 MED ORDER — GUAIFENESIN 200 MG/5ML PO LIQD: 10.0000 mL | ORAL | 0 refills | Status: DC | PRN

## 2017-08-25 MED ORDER — METOPROLOL TARTRATE 25 MG PO TABS: 12.5000 mg | ORAL_TABLET | Freq: Two times a day (BID) | ORAL | 2 refills | Status: DC

## 2017-08-25 MED ORDER — AZITHROMYCIN 500 MG PO TABS: 500.0000 mg | ORAL_TABLET | Freq: Every day | ORAL | 0 refills | Status: AC

## 2017-08-25 MED ORDER — ONDANSETRON 4 MG PO TBDP: 4.0000 mg | ORAL_TABLET | Freq: Three times a day (TID) | ORAL | 0 refills | Status: DC | PRN

## 2017-08-25 NOTE — Discharge Summary (Signed)
Howard Padilla, is a 84 y.o. male  DOB 1915-04-09  MRN [id number].  Admission date:  08/21/2017  Admitting Physician  Kayleen Memos, DO  Discharge Date:  08/25/2017   Primary MD  [name], MD  Recommendations for primary care physician for things to follow:   1) take medications as prescribed, 2) please encourage adequate fluid and nutritional intake 3)PCP /primary care physician for repeat BMP and CBC in 5 to 7 days   Admission Diagnosis  Weakness [R53.1] Near syncope [R55]   Discharge Diagnosis  Weakness [R53.1] Near syncope [R55]    Active Problems:   Postural dizziness with presyncope      Past Medical History:  Diagnosis Date  . BPH (benign prostatic hypertrophy)   . Cancer ([place])    colon  . Dementia   . DVT (deep venous thrombosis) ([place])    with pulmonary embo.  Marland Kitchen Hyperlipidemia   . Hypertension     Past Surgical History:  Procedure Laterality Date  . COLON SURGERY     partial colectomy  . HERNIA REPAIR     ingunial     HPI  from the history and physical done on the day of admission:    Chief Complaint: Dizziness and nearly passing out  HPI: Howard Padilla is a 84 y.o. male with medical history significant for prior CVA with residual aphasia, at baseline ambulatory with no assist, dementia, chronic normocytic anemia, CKD 3, HTN, who presented to ED [facility] after nearly passing out at home.  Symptoms associated with increasing difficulty ambulating, drowsiness and vomiting once today.  At the time of this visit the patient's wife was not in the room, called her contact information multiple times with no answer.  History is mostly obtained from ED physician and medical records.  Patient is alert but confused in the setting of dementia.  ED Course: Upon presentation to the ED, vital signs remarkable for accelerated hypertension, tachycardia and hypoxia with pulse ox  87%.  Lab studies remarkable for elevated creatinine of 1.77 with baseline creatinine 1.4, hyperglycemia with chemistry glucose of 163.  Troponin 0.03.  Denies chest pain.  CT head no contrast done on admission unremarkable for any acute intracranial abnormality.  Revealed remote moderately large posterior left frontoparietal lobe infarct.  No urine analysis or chest x-ray available at the time of this dictation.  Admitted for presyncope.    Hospital Course:   Brief summary [name] is an 84 y.o. male with medical history significant forprior CVAwithresidual aphasia, atbaseline ambulatory with no assist, dementia, chronicnormocytic anemia, CKD 3, HTN, [facility]op[facility]ed [facility] ED [facility] after nearly passing out at home.  Found to have a fever with pneumonia and an elevated d dimer with V/Q scan  negative for PE,   Plan: 1)Postural dizziness with Near-syncope--  d-dimer was elevated however VQ scan low probability for PE, CT head without acute findings, patient is not orthostatic, supportive and symptomatic treatment advised  2)Community acquired Pneumonia--- patient was admitted with fevers, hypoxia and radiological findings suggestive  of pneumonia, Rocephin/azithromycin, treated with bronchodilators, mucolytics, blood cultures negative so far,  discharge home on Omnicef and azithromycin  3)Generalized weakness/debility--- PT eval appreciated patient will need home health rehab  4)AKI----acute kidney injury on CKD stage - III    , overall renal function is improved, is back to baseline at this time ... creatinine on admission=  1.77,   baseline creatinine = 1.4   , creatinine is now= 1.4      ,  Avoid nephrotoxic agents/dehydration/hypotension  5)HFpEF--- H/o Chronic diastolic CHF,  No Acute Exacer, Echo with preserved EF over 60 %,  with no diagnostic regional wall motion abnormality and  diastolic dysfunction,    6)History of CVA with aphasia-  C/n Aspirin  7)Hyperglycemia-hemoglobin  A1c: 5.8, blood sugars improved , hypoglycemia was probably stress-induced  8)HTN-improved BP, discharged home on metoprolol 12.5 mg twice daily, continue amlodipine 10 mg daily, Cardura    Family Communication/Anticipated D/C date and plan/Code Status    Code Status: Full Code.  Family Communication: wife/granddaughter at bedside Disposition Plan: home with home health services  Discharge Condition: stable  Follow UP  Follow-up Information    [name], MD Follow up on 08/28/2017.   Specialty:  Internal Medicine Why:  at 2:45 pm; please try to keep your apt or call to reschedule Contact information: [place] [phone]            Diet and Activity recommendation:  As advised  Discharge Instructions    Discharge Instructions    Call MD for:  difficulty breathing, headache or visual disturbances   Complete by:  As directed    Call MD for:  persistant dizziness or light-headedness   Complete by:  As directed    Call MD for:  persistant nausea and vomiting   Complete by:  As directed    Call MD for:  temperature >100.4   Complete by:  As directed    Diet - low sodium heart healthy   Complete by:  As directed    Discharge instructions   Complete by:  As directed    1) take medications as prescribed, 2) please encourage adequate fluid and nutritional intake 3)PCP /primary care physician for repeat BMP and CBC in 5 to 7 days  4)Please note  You were cared for by a hospitalist Physician during your hospital stay. Once you are discharged, your primary care physician will handle any further medical issues. Please note that NO REFILLS for any discharge medications will be authorized once you are discharged, as it is imperative that you return to your primary care physician (or establish a relationship with a primary care physician if you do not have one) for your aftercare needs so that they can reassess your need for  medications and monitor your lab values   Increase activity slowly   Complete by:  As directed         Discharge Medications     Allergies as of 08/25/2017      Reactions   Penicillins Rash, Other (See Comments)   Has patient had a PCN reaction causing immediate rash, facial/tongue/throat swelling, SOB or lightheadedness with hypotension: No Has patient had a PCN reaction causing severe rash involving mucus membranes or skin necrosis: No Has patient had a PCN reaction that required hospitalization No Has patient had a PCN reaction occurring within the last 10 years: No If all of the above answers are "NO", then may proceed with Cephalosporin use.  Medication List    STOP taking these medications   aspirin 325 MG tablet Replaced by:  aspirin 81 MG chewable tablet   fosinopril 20 MG tablet Commonly known as:  MONOPRIL     TAKE these medications   acetaminophen 500 MG tablet Commonly known as:  TYLENOL Take 2 tablets (1,000 mg total) by mouth every 6 (six) hours as needed. What changed:  reasons to take this   amLODipine 10 MG tablet Commonly known as:  NORVASC Take 1 tablet (10 mg total) by mouth daily. For BP Start taking on:  08/26/2017   aspirin 81 MG chewable tablet Chew 1 tablet (81 mg total) by mouth daily. With Breakfast Start taking on:  08/26/2017 Replaces:  aspirin 325 MG tablet   azithromycin 500 MG tablet Commonly known as:  ZITHROMAX Take 1 tablet (500 mg total) by mouth daily for 3 days. Start taking on:  08/26/2017   cefdinir 300 MG capsule Commonly known as:  OMNICEF Take 1 capsule (300 mg total) by mouth 2 (two) times daily for 4 days.   doxazosin 4 MG tablet Commonly known as:  CARDURA TAKE 1 TABLET BY MOUTH EVERYDAY AT BEDTIME   Guaifenesin 200 MG/5ML Liqd Take 10 mLs (400 mg total) by mouth every 4 (four) hours as needed.   metoprolol tartrate 25 MG tablet Commonly known as:  LOPRESSOR Take 0.5 tablets (12.5 mg total) by mouth 2 (two)  times daily.   multivitamin with minerals Tabs tablet Take 1 tablet by mouth daily.   ondansetron 4 MG disintegrating tablet Commonly known as:  ZOFRAN ODT Take 1 tablet (4 mg total) by mouth every 8 (eight) hours as needed for nausea or vomiting.   vitamin C 500 MG tablet Commonly known as:  ASCORBIC ACID Take 500 mg by mouth daily.            Durable Medical Equipment  (From admission, onward)        Start     Ordered   08/22/17 1030  For home use only DME Walker rolling  Once    Comments:  Rollater  Question:  Patient needs a walker to treat with the following condition  Answer:  Pneumonia   08/22/17 1030      Major procedures and Radiology Reports - PLEASE review detailed and final reports for all details, in brief -   Dg Chest 2 View  Result Date: 08/25/2017 CLINICAL DATA:  Shortness of breath.  Fever and pneumonia EXAM: CHEST - 2 VIEW COMPARISON:  Four days ago FINDINGS: Low volume chest with generalized interstitial coarsening and stable streaky opacity. Trace if any pleural effusions. Mild cardiomegaly. Aortic tortuosity. IMPRESSION: 1. History of pneumonia with stable lung opacities. 2. COPD and lung scarring. Electronically Signed   By: Monte Fantasia M.D.   On: 08/25/2017 10:45   Ct Head Wo Contrast  Result Date: 08/21/2017 CLINICAL DATA:  One 84 year old male became weak and dizzy at home. No injury. Initial encounter. EXAM: CT HEAD WITHOUT CONTRAST TECHNIQUE: Contiguous axial images were obtained from the base of the skull through the vertex without intravenous contrast. COMPARISON:  07/14/2016 CT. FINDINGS: Brain: No intracranial hemorrhage or CT evidence of large acute infarct. Remote moderately large posterior left frontal-parietal lobe infarct with encephalomalacia. Marked chronic microvascular changes. Moderate to marked global atrophy. No intracranial mass lesion noted on this unenhanced exam. Vascular: Vascular calcifications Skull: No acute  abnormality. Sinuses/Orbits: No acute orbital abnormality. Visualized paranasal sinuses clear. Other: Opacification left mastoid  air cells and middle ear cavity without obstructing lesion of the eustachian tube noted. Transverse ligament hypertrophy with mild narrowing ventral aspect upper cervical canal. IMPRESSION: No acute intracranial abnormality. Remote moderately large posterior left frontal-parietal lobe infarct. Marked chronic microvascular changes. Moderate to marked global atrophy. Opacification left mastoid air cells and middle ear cavity without obstructing lesion of the eustachian tube noted. Electronically Signed   By: Genia Del M.D.   On: 08/21/2017 14:44   Nm Pulmonary Perf And Vent  Result Date: 08/23/2017 CLINICAL DATA:  Fever and pneumonia.  Elevated D-dimer EXAM: NUCLEAR MEDICINE VENTILATION - PERFUSION LUNG SCAN TECHNIQUE: Ventilation images were obtained in multiple projections using inhaled aerosol Tc-57m DTPA. Perfusion images were obtained in multiple projections after intravenous injection of Tc-10m-MAA. RADIOPHARMACEUTICALS:  31.0 mCi of Tc-83m DTPA aerosol inhalation and 4.0 mCi Tc31m-MAA IV COMPARISON:  Chest x-ray August 21, 2017 FINDINGS: Ventilation: Multiple matched defects. Perfusion: Multiple matched defects. IMPRESSION: Very low probability V/Q scan. Electronically Signed   By: Dorise Bullion III M.D   On: 08/23/2017 10:58   Dg Chest Portable 1 View  Result Date: 08/21/2017 CLINICAL DATA:  Weak and dizzy, hypertension, smoker EXAM: PORTABLE CHEST 1 VIEW COMPARISON:  03/03/2017 FINDINGS: Background COPD/emphysema noted. Slight worsening left perihilar and bibasilar bronchovascular opacities concerning for pneumonia. Negative for edema, effusion or pneumothorax. Trachea is midline. Aorta is atherosclerotic and tortuous. Heart is mildly enlarged. Normal bowel gas pattern. Monitor leads overlie the chest. IMPRESSION: Slight worsening left perihilar and bibasilar  bronchovascular opacities concerning for superimposed pneumonia on background COPD. Atherosclerosis Electronically Signed   By: Jerilynn Mages.  Shick M.D.   On: 08/21/2017 17:46    Micro Results   Recent Results (from the past 240 hour(s))  Urine Culture     Status: Abnormal   Collection Time: 08/21/17  5:25 PM  Result Value Ref Range Status   Specimen Description URINE, CLEAN CATCH  Final   Special Requests NONE  Final   Culture (A)  Final    <10,000 COLONIES/mL INSIGNIFICANT GROWTH Performed at [facility], [address], [place], [place] [address]    Report Status 08/23/2017 FINAL  Final  Culture, blood (routine x 2) Call MD if unable to obtain prior to antibiotics being given     Status: None (Preliminary result)   Collection Time: 08/22/17  8:45 AM  Result Value Ref Range Status   Specimen Description BLOOD RIGHT ANTECUBITAL  Final   Special Requests   Final    BOTTLES DRAWN AEROBIC AND ANAEROBIC Blood Culture adequate volume   Culture   Final    NO GROWTH 3 DAYS Performed at [facility], [place] [address], [place], [place] [address]    Report Status PENDING  Incomplete  Culture, blood (routine x 2) Call MD if unable to obtain prior to antibiotics being given     Status: None (Preliminary result)   Collection Time: 08/22/17  8:55 AM  Result Value Ref Range Status   Specimen Description BLOOD LEFT HAND  Final   Special Requests   Final    BOTTLES DRAWN AEROBIC ONLY Blood Culture adequate volume   Culture   Final    NO GROWTH 3 DAYS Performed at [facility], [address], [place],  [address]    Report Status PENDING  Incomplete       Today   Subjective    Kenyan Karnes today has no new complaints, wife and granddaughter at bedside, oral intake is  better, no fevers          Patient has been seen and examined prior to discharge   Objective   Blood pressure 140/77, pulse 76, temperature 98 F (36.7 C), temperature source Oral, resp. rate 20,  height 5\' 5"  (1.651 m), weight 57.6 kg (126 lb 14.4 oz), SpO2 97 %.   Intake/Output Summary (Last 24 hours) at 08/25/2017 1337 Last data filed at 08/25/2017 0900 Gross per 24 hour  Intake 360 ml  Output 650 ml  Net -290 ml    Exam  Gen:- Awake Alert,  In no apparent distress  HEENT:- [place].AT, No sclera icterus Neck-Supple Neck,No JVD,.  Lungs-improving air movement, no wheezing  CV- S1, S2 normal Abd-  +ve B.Sounds, Abd Soft, No tenderness,    Extremity/Skin:- No  edema,   good pulses Psych-affect is appropriate, oriented x 2 Neuro-no new focal deficits, no tremors     Data Review   CBC w Diff:  Lab Results  Component Value Date   WBC 4.6 08/24/2017   HGB 11.0 (L) 08/24/2017   HCT 34.1 (L) 08/24/2017   PLT 183 08/24/2017   LYMPHOPCT 6 08/21/2017   MONOPCT 7 08/21/2017   EOSPCT 0 08/21/2017   BASOPCT 0 08/21/2017    CMP:  Lab Results  Component Value Date   NA 136 08/25/2017   K 4.2 08/25/2017   CL 108 08/25/2017   CO2 21 (L) 08/25/2017   BUN 25 (H) 08/25/2017   CREATININE 1.46 (H) 08/25/2017   PROT 5.7 (L) 08/23/2017   ALBUMIN 2.8 (L) 08/23/2017   BILITOT 0.5 08/23/2017   ALKPHOS 75 08/23/2017   AST 19 08/23/2017   ALT 17 08/23/2017  .   Total Discharge time is about 33 minutes  Roxan Hockey M.D on 08/25/2017 at 1:37 PM  [facility]   Office  [phone]  Voice Recognition Viviann Spare dictation system was used to create this note, attempts have been made to correct errors. Please contact the author with questions and/or clarifications.

## 2017-08-25 NOTE — Progress Notes (Signed)
PHARMACIST - PHYSICIAN COMMUNICATION DR:   Denton Brick CONCERNING: Antibiotic IV to Oral Route Change Policy  RECOMMENDATION: This patient is receiving azithromycin by the intravenous route.  Based on criteria approved by the Pharmacy and Therapeutics Committee, the antibiotic(s) is/are being converted to the equivalent oral dose form(s).   DESCRIPTION: These criteria include:  Patient being treated for a respiratory tract infection, urinary tract infection, cellulitis or clostridium difficile associated diarrhea if on metronidazole  The patient is not neutropenic and does not exhibit a GI malabsorption state  The patient is eating (either orally or via tube) and/or has been taking other orally administered medications for a least 24 hours  The patient is improving clinically and has a Tmax < 100.5  If you have questions about this conversion, please contact the Pharmacy Department  []   [phone] )  [facility] [x]   [phone] )  [facility]  []   [phone] )  [facility] []   [phone] )  [facility]

## 2017-08-25 NOTE — Discharge Instructions (Signed)
1) take medications as prescribed, 2) please encourage adequate fluid and nutritional intake 3)PCP /primary care physician for repeat BMP and CBC in 5 to 7 days  4)Please note  You were cared for by a hospitalist Physician during your hospital stay. Once you are discharged, your primary care physician will handle any further medical issues. Please note that NO REFILLS for any discharge medications will be authorized once you are discharged, as it is imperative that you return to your primary care physician (or establish a relationship with a primary care physician if you do not have one) for your aftercare needs so that they can reassess your need for medications and monitor your lab values

## 2017-08-26 DIAGNOSIS — J189 Pneumonia, unspecified organism: Secondary | ICD-10-CM | POA: Diagnosis not present

## 2017-08-26 DIAGNOSIS — I130 Hypertensive heart and chronic kidney disease with heart failure and stage 1 through stage 4 chronic kidney disease, or unspecified chronic kidney disease: Secondary | ICD-10-CM | POA: Diagnosis not present

## 2017-08-26 DIAGNOSIS — R55 Syncope and collapse: Secondary | ICD-10-CM | POA: Diagnosis not present

## 2017-08-26 DIAGNOSIS — D631 Anemia in chronic kidney disease: Secondary | ICD-10-CM | POA: Diagnosis not present

## 2017-08-26 DIAGNOSIS — I5032 Chronic diastolic (congestive) heart failure: Secondary | ICD-10-CM | POA: Diagnosis not present

## 2017-08-26 DIAGNOSIS — N183 Chronic kidney disease, stage 3 (moderate): Secondary | ICD-10-CM | POA: Diagnosis not present

## 2017-08-27 ENCOUNTER — Telehealth: Payer: Self-pay | Admitting: Internal Medicine

## 2017-08-27 DIAGNOSIS — N183 Chronic kidney disease, stage 3 (moderate): Secondary | ICD-10-CM | POA: Diagnosis not present

## 2017-08-27 DIAGNOSIS — J189 Pneumonia, unspecified organism: Secondary | ICD-10-CM | POA: Diagnosis not present

## 2017-08-27 DIAGNOSIS — D631 Anemia in chronic kidney disease: Secondary | ICD-10-CM | POA: Diagnosis not present

## 2017-08-27 DIAGNOSIS — R55 Syncope and collapse: Secondary | ICD-10-CM | POA: Diagnosis not present

## 2017-08-27 DIAGNOSIS — I130 Hypertensive heart and chronic kidney disease with heart failure and stage 1 through stage 4 chronic kidney disease, or unspecified chronic kidney disease: Secondary | ICD-10-CM | POA: Diagnosis not present

## 2017-08-27 DIAGNOSIS — I5032 Chronic diastolic (congestive) heart failure: Secondary | ICD-10-CM | POA: Diagnosis not present

## 2017-08-27 LAB — CULTURE, BLOOD (ROUTINE X 2)
CULTURE: NO GROWTH
Culture: NO GROWTH
Special Requests: ADEQUATE
Special Requests: ADEQUATE

## 2017-08-27 NOTE — Telephone Encounter (Signed)
Copied from [place] [phone] [phone]. Topic: Quick Communication - See Telephone Encounter >> Aug 27, 2017  3:40 PM Mylinda Latina, NT wrote: CRM for notification. See Telephone encounter for: 08/27/17. Anda Kraft from [place] [facility] is calling to request verbal orders for Physical therapy 2x a week for 8 weeks  Please call back with these orders CB# [phone]

## 2017-08-27 NOTE — Telephone Encounter (Unsigned)
Copied from [place] [id number]. Topic: Quick Communication - See Telephone Encounter >> Aug 27, 2017  4:37 PM Neva Seat wrote: [place] - [phone] - ask to speak with Tamsen Meek.  Needing veral orders to follow pt or education care. Pt was released from hospital with control of Nausea - Vomiting - Pnemonia

## 2017-08-28 ENCOUNTER — Encounter: Payer: Self-pay | Admitting: Internal Medicine

## 2017-08-28 ENCOUNTER — Ambulatory Visit (INDEPENDENT_AMBULATORY_CARE_PROVIDER_SITE_OTHER): Payer: Medicare Other | Admitting: Internal Medicine

## 2017-08-28 ENCOUNTER — Telehealth: Payer: Self-pay | Admitting: Internal Medicine

## 2017-08-28 VITALS — BP 100/60 | HR 74 | Temp 97.7°F

## 2017-08-28 DIAGNOSIS — R55 Syncope and collapse: Secondary | ICD-10-CM | POA: Diagnosis not present

## 2017-08-28 DIAGNOSIS — I10 Essential (primary) hypertension: Secondary | ICD-10-CM | POA: Diagnosis not present

## 2017-08-28 DIAGNOSIS — R42 Dizziness and giddiness: Secondary | ICD-10-CM | POA: Diagnosis not present

## 2017-08-28 DIAGNOSIS — N400 Enlarged prostate without lower urinary tract symptoms: Secondary | ICD-10-CM | POA: Diagnosis not present

## 2017-08-28 MED ORDER — DOXAZOSIN MESYLATE 4 MG PO TABS: 2.0000 mg | ORAL_TABLET | Freq: Every day | ORAL | 0 refills | Status: DC

## 2017-08-28 MED ORDER — AMLODIPINE BESYLATE 5 MG PO TABS: 5.0000 mg | ORAL_TABLET | Freq: Every day | ORAL | 3 refills | Status: DC

## 2017-08-28 NOTE — Telephone Encounter (Signed)
Referring physician: Dr Tomi Bamberger  PCP: [name], MD  Outpatient Specialists: Unknown Patient coming from: Home  Transition Care Management Follow-up Telephone Call   Date discharged? 08/25/17   How have you been since you were released from the hospital? "For 84 years old he is doing pretty good."   Do you understand why you were in the hospital? Yes, but has questions about diagnosis that was discussed in the Hospital - COPD and Emphysema - wife is concerned of this never being discussed if truly an active disease.    Do you understand the discharge instructions? yes   Where were you discharged to? Home   Items Reviewed:  Medications reviewed: Will be reviewed at today's HFU  Allergies reviewed: Will be reviewed at today's HFU  Dietary changes reviewed: no changes, "Eliyohu eats good, he's always been a good eater!"  Referrals reviewed: yes   Functional Questionnaire:   Activities of Daily Living (ADLs):   He states they are independent in the following: ambulation, bathing and hygiene, feeding, continence, grooming, toileting and dressing States they require assistance with the following: n/a   Any transportation issues/concerns?: no   Any patient concerns? yes, Plans to discuss diagnosis given in Hospital - COPD/Emphysema   Confirmed importance and date/time of follow-up visits scheduled yes  Provider Appointment booked with Dr Burnice Logan 08/28/17 at 2:45p  Confirmed with patient if condition begins to worsen call PCP or go to the ER.  Patient was given the office number and encouraged to call back with question or concerns.  : yes

## 2017-08-28 NOTE — Telephone Encounter (Signed)
Yes please confirm home physical therapy orders

## 2017-08-28 NOTE — Patient Instructions (Signed)
Decrease amlodipine to 5 mg daily  (present dose is 10 mg daily)  Decrease doxazosin 1/2 tablet daily (2 mg)  Return in 2 months for follow-up

## 2017-08-28 NOTE — Telephone Encounter (Addendum)
LM for patient for TCM Hospitalization Follow up  Pt coming in today for HFU at 2:45, pt advised to call back to discuss recent Hospitalization prior to his appt.

## 2017-08-28 NOTE — Telephone Encounter (Signed)
Okay for verbal orders? Please advise 

## 2017-08-28 NOTE — Telephone Encounter (Signed)
Anda Kraft [facility] calling to follow up on the PT orders. They are hoping to see pt tomorrow, since this is a holiday week. [phone]  2 wk 8

## 2017-08-28 NOTE — Telephone Encounter (Signed)
Okay for verbal orders. 

## 2017-08-28 NOTE — Progress Notes (Signed)
Subjective:    Patient ID: Howard Padilla, male    DOB: Mar 19, 1915, 84 y.o.   MRN: [id number]  HPI  84 year old patient who is seen today following a recent hospital discharge and for transitional care management.  He was discharged from the hospital 3 days ago after dizziness and near syncope.  He was discharged on azithromycin and Omnicef.  Chest x-ray revealed findings consistent with COPD and some scarring but no acute infiltrate since his discharge he has done well.  His wife states that he continues to eat very well and has been quite stable.  Patient denies any cough or shortness of breath Medications were reviewed.  Aspirin therapy was down titrated from 325 to 81 mg daily. Fosinopril was discontinued.  He has a history of BPH and was discharged on doxazosin 4 mg daily.  Additionally amlodipine 10 and metoprolol 12.5 mg twice daily. The patient feels well denies any dizziness or presyncopal symptoms.  Blood pressure in the office 100/60.  Orthostatic reading not attempted  Past Medical History:  Diagnosis Date  . BPH (benign prostatic hypertrophy)   . Cancer ([place])    colon  . Dementia   . DVT (deep venous thrombosis) ([place])    with pulmonary embo.  Marland Kitchen Hyperlipidemia   . Hypertension      Social History   Socioeconomic History  . Marital status: Married    Spouse name: Not on file  . Number of children: Not on file  . Years of education: Not on file  . Highest education level: Not on file  Occupational History  . Not on file  Social Needs  . Financial resource strain: Not on file  . Food insecurity:    Worry: Not on file    Inability: Not on file  . Transportation needs:    Medical: Not on file    Non-medical: Not on file  Tobacco Use  . Smoking status: Former Smoker    Packs/day: 2.00    Years: 30.00    Pack years: 60.00    Types: Cigarettes    Last attempt to quit: 02/28/1983    Years since quitting: 34.5  . Smokeless tobacco: Never Used  Substance and  Sexual Activity  . Alcohol use: No  . Drug use: No  . Sexual activity: Not on file  Lifestyle  . Physical activity:    Days per week: Not on file    Minutes per session: Not on file  . Stress: Not on file  Relationships  . Social connections:    Talks on phone: Not on file    Gets together: Not on file    Attends religious service: Not on file    Active member of club or organization: Not on file    Attends meetings of clubs or organizations: Not on file    Relationship status: Not on file  . Intimate partner violence:    Fear of current or ex partner: Not on file    Emotionally abused: Not on file    Physically abused: Not on file    Forced sexual activity: Not on file  Other Topics Concern  . Not on file  Social History Narrative  . Not on file    Past Surgical History:  Procedure Laterality Date  . COLON SURGERY     partial colectomy  . HERNIA REPAIR     ingunial    Family History  Family history unknown: Yes    Allergies  Allergen Reactions  .  Penicillins Rash and Other (See Comments)    Has patient had a PCN reaction causing immediate rash, facial/tongue/throat swelling, SOB or lightheadedness with hypotension: No Has patient had a PCN reaction causing severe rash involving mucus membranes or skin necrosis: No Has patient had a PCN reaction that required hospitalization No Has patient had a PCN reaction occurring within the last 10 years: No If all of the above answers are "NO", then may proceed with Cephalosporin use.     Current Outpatient Medications on File Prior to Visit  Medication Sig Dispense Refill  . acetaminophen (TYLENOL) 500 MG tablet Take 2 tablets (1,000 mg total) by mouth every 6 (six) hours as needed. 30 tablet 0  . aspirin 81 MG chewable tablet Chew 1 tablet (81 mg total) by mouth daily. With Breakfast 30 tablet 1  . azithromycin (ZITHROMAX) 500 MG tablet Take 1 tablet (500 mg total) by mouth daily for 3 days. 3 tablet 0  . cefdinir  (OMNICEF) 300 MG capsule Take 1 capsule (300 mg total) by mouth 2 (two) times daily for 4 days. 8 capsule 0  . Guaifenesin 200 MG/5ML LIQD Take 10 mLs (400 mg total) by mouth every 4 (four) hours as needed. 118 mL 0  . metoprolol tartrate (LOPRESSOR) 25 MG tablet Take 0.5 tablets (12.5 mg total) by mouth 2 (two) times daily. 60 tablet 2  . Multiple Vitamin (MULTIVITAMIN WITH MINERALS) TABS tablet Take 1 tablet by mouth daily.    . ondansetron (ZOFRAN ODT) 4 MG disintegrating tablet Take 1 tablet (4 mg total) by mouth every 8 (eight) hours as needed for nausea or vomiting. 20 tablet 0  . vitamin C (ASCORBIC ACID) 500 MG tablet Take 500 mg by mouth daily.     No current facility-administered medications on file prior to visit.     Pulse 74   Temp 97.7 F (36.5 C) (Oral)     Review of Systems  Constitutional: Positive for fatigue. Negative for appetite change, chills and fever.  HENT: Negative for congestion, dental problem, ear pain, hearing loss, sore throat, tinnitus, trouble swallowing and voice change.   Eyes: Negative for pain, discharge and visual disturbance.  Respiratory: Negative for cough, chest tightness, wheezing and stridor.   Cardiovascular: Negative for chest pain, palpitations and leg swelling.  Gastrointestinal: Negative for abdominal distention, abdominal pain, blood in stool, constipation, diarrhea, nausea and vomiting.  Genitourinary: Negative for difficulty urinating, discharge, flank pain, genital sores, hematuria and urgency.  Musculoskeletal: Positive for gait problem. Negative for arthralgias, back pain, joint swelling, myalgias and neck stiffness.  Skin: Negative for rash.  Neurological: Positive for dizziness and weakness. Negative for syncope, speech difficulty, numbness and headaches.  Hematological: Negative for adenopathy. Does not bruise/bleed easily.  Psychiatric/Behavioral: Negative for behavioral problems and dysphoric mood. The patient is not  nervous/anxious.        Objective:   Physical Exam  Constitutional: He is oriented to person, place, and time. He appears well-developed. No distress.  Blood pressure 100/60  HENT:  Head: Normocephalic.  Right Ear: External ear normal.  Left Ear: External ear normal.  Eyes: Conjunctivae and EOM are normal.  Neck: Normal range of motion.  Cardiovascular: Normal rate.  Murmur heard. -Grade 1- 2/6 systolic murmur  Pulmonary/Chest: Breath sounds normal.  Slight diminished breath sounds but clear  Abdominal: Bowel sounds are normal.  Musculoskeletal: Normal range of motion. He exhibits no edema or tenderness.  Neurological: He is alert and oriented to person, place, and time.  Psychiatric: He has a normal mood and affect. His behavior is normal.          Assessment & Plan:   History of presyncope History of essential hypertension.  Blood pressure presently in a low normal range.  Will decrease amlodipine from 10 mg daily to 5 mg daily.  Decrease doxazosin from 4 to 2 mg daily BPH  The patient will complete antibiotic therapy Follow-up 2 months Patient report any clinical change  [name]

## 2017-08-29 ENCOUNTER — Telehealth: Payer: Self-pay | Admitting: Internal Medicine

## 2017-08-29 NOTE — Telephone Encounter (Signed)
Copied from [place] [id number]. Topic: Quick Communication - See Telephone Encounter >> Aug 29, 2017  2:01 PM Conception Chancy, NT wrote: CRM for notification. See Telephone encounter for: 08/29/17  Judeen Hammans is calling from [facility] and is requesting verbals for skilled nursing 2x a week for 5 weeks and then 1x a week for 1 week. Also requesting home health aid 2x a week for 7 weeks to help with bathing.   Cb# [phone]

## 2017-08-29 NOTE — Telephone Encounter (Signed)
Verbal orders given to Kate.  

## 2017-09-03 DIAGNOSIS — N183 Chronic kidney disease, stage 3 (moderate): Secondary | ICD-10-CM | POA: Diagnosis not present

## 2017-09-03 DIAGNOSIS — I130 Hypertensive heart and chronic kidney disease with heart failure and stage 1 through stage 4 chronic kidney disease, or unspecified chronic kidney disease: Secondary | ICD-10-CM | POA: Diagnosis not present

## 2017-09-03 DIAGNOSIS — J189 Pneumonia, unspecified organism: Secondary | ICD-10-CM | POA: Diagnosis not present

## 2017-09-03 DIAGNOSIS — R55 Syncope and collapse: Secondary | ICD-10-CM | POA: Diagnosis not present

## 2017-09-03 DIAGNOSIS — I5032 Chronic diastolic (congestive) heart failure: Secondary | ICD-10-CM | POA: Diagnosis not present

## 2017-09-03 DIAGNOSIS — D631 Anemia in chronic kidney disease: Secondary | ICD-10-CM | POA: Diagnosis not present

## 2017-09-03 NOTE — Telephone Encounter (Signed)
V/o given to [place].

## 2017-09-03 NOTE — Telephone Encounter (Signed)
Okay for verbal orders? Please advise 

## 2017-09-04 NOTE — Telephone Encounter (Signed)
Please advise 

## 2017-09-04 NOTE — Telephone Encounter (Signed)
Okay for verbal orders. 

## 2017-09-05 DIAGNOSIS — I5032 Chronic diastolic (congestive) heart failure: Secondary | ICD-10-CM | POA: Diagnosis not present

## 2017-09-05 DIAGNOSIS — I130 Hypertensive heart and chronic kidney disease with heart failure and stage 1 through stage 4 chronic kidney disease, or unspecified chronic kidney disease: Secondary | ICD-10-CM | POA: Diagnosis not present

## 2017-09-05 DIAGNOSIS — D631 Anemia in chronic kidney disease: Secondary | ICD-10-CM | POA: Diagnosis not present

## 2017-09-05 DIAGNOSIS — J189 Pneumonia, unspecified organism: Secondary | ICD-10-CM | POA: Diagnosis not present

## 2017-09-05 DIAGNOSIS — R55 Syncope and collapse: Secondary | ICD-10-CM | POA: Diagnosis not present

## 2017-09-05 DIAGNOSIS — N183 Chronic kidney disease, stage 3 (moderate): Secondary | ICD-10-CM | POA: Diagnosis not present

## 2017-09-05 NOTE — Telephone Encounter (Signed)
Verbal orders given to [facility].

## 2017-09-11 DIAGNOSIS — I5032 Chronic diastolic (congestive) heart failure: Secondary | ICD-10-CM | POA: Diagnosis not present

## 2017-09-11 DIAGNOSIS — I130 Hypertensive heart and chronic kidney disease with heart failure and stage 1 through stage 4 chronic kidney disease, or unspecified chronic kidney disease: Secondary | ICD-10-CM | POA: Diagnosis not present

## 2017-09-11 DIAGNOSIS — R55 Syncope and collapse: Secondary | ICD-10-CM | POA: Diagnosis not present

## 2017-09-11 DIAGNOSIS — N183 Chronic kidney disease, stage 3 (moderate): Secondary | ICD-10-CM | POA: Diagnosis not present

## 2017-09-11 DIAGNOSIS — J189 Pneumonia, unspecified organism: Secondary | ICD-10-CM | POA: Diagnosis not present

## 2017-09-11 DIAGNOSIS — D631 Anemia in chronic kidney disease: Secondary | ICD-10-CM | POA: Diagnosis not present

## 2017-09-13 DIAGNOSIS — N183 Chronic kidney disease, stage 3 (moderate): Secondary | ICD-10-CM | POA: Diagnosis not present

## 2017-09-13 DIAGNOSIS — I130 Hypertensive heart and chronic kidney disease with heart failure and stage 1 through stage 4 chronic kidney disease, or unspecified chronic kidney disease: Secondary | ICD-10-CM | POA: Diagnosis not present

## 2017-09-13 DIAGNOSIS — D631 Anemia in chronic kidney disease: Secondary | ICD-10-CM | POA: Diagnosis not present

## 2017-09-13 DIAGNOSIS — R55 Syncope and collapse: Secondary | ICD-10-CM | POA: Diagnosis not present

## 2017-09-13 DIAGNOSIS — J189 Pneumonia, unspecified organism: Secondary | ICD-10-CM | POA: Diagnosis not present

## 2017-09-13 DIAGNOSIS — I5032 Chronic diastolic (congestive) heart failure: Secondary | ICD-10-CM | POA: Diagnosis not present

## 2017-09-14 ENCOUNTER — Telehealth: Payer: Self-pay | Admitting: *Deleted

## 2017-09-14 NOTE — Telephone Encounter (Signed)
FYI  Copied from [place] [phone]. Topic: General - Other >> Sep 14, 2017  8:19 AM Keene Breath wrote: Reason for CRM: Amy from [facility] called to report a missed visit on 09/07/17.  Reason for missed visit was not the right time of day.  CB# [phone].

## 2017-09-14 NOTE — Telephone Encounter (Signed)
FYI only.

## 2017-09-17 DIAGNOSIS — I130 Hypertensive heart and chronic kidney disease with heart failure and stage 1 through stage 4 chronic kidney disease, or unspecified chronic kidney disease: Secondary | ICD-10-CM | POA: Diagnosis not present

## 2017-09-17 DIAGNOSIS — J189 Pneumonia, unspecified organism: Secondary | ICD-10-CM | POA: Diagnosis not present

## 2017-09-17 DIAGNOSIS — I5032 Chronic diastolic (congestive) heart failure: Secondary | ICD-10-CM | POA: Diagnosis not present

## 2017-09-17 DIAGNOSIS — N183 Chronic kidney disease, stage 3 (moderate): Secondary | ICD-10-CM | POA: Diagnosis not present

## 2017-09-17 DIAGNOSIS — R55 Syncope and collapse: Secondary | ICD-10-CM | POA: Diagnosis not present

## 2017-09-17 DIAGNOSIS — D631 Anemia in chronic kidney disease: Secondary | ICD-10-CM | POA: Diagnosis not present

## 2017-09-18 ENCOUNTER — Telehealth: Payer: Self-pay | Admitting: Internal Medicine

## 2017-09-18 DIAGNOSIS — N183 Chronic kidney disease, stage 3 unspecified: Secondary | ICD-10-CM

## 2017-09-18 DIAGNOSIS — I10 Essential (primary) hypertension: Secondary | ICD-10-CM

## 2017-09-18 DIAGNOSIS — R4701 Aphasia: Secondary | ICD-10-CM

## 2017-09-18 NOTE — Telephone Encounter (Signed)
Okay to switch to [facility]

## 2017-09-18 NOTE — Telephone Encounter (Signed)
Dr. [name] - [facility] is no longer able to provide services to the pt d/t the wife's behavior. She has used profanity with several employees and has expressed how unhappy she is with their services. Per [facility] the wife has requested to change providers to [place].   Please advise. Thanks!

## 2017-09-18 NOTE — Telephone Encounter (Signed)
Copied from [place] [phone]. Topic: Quick Communication - See Telephone Encounter >> Sep 18, 2017  2:22 PM Percell Belt A wrote: CRM for notification. See Telephone encounter for: 09/18/17. Cindy with [facility] at home [phone] She was calling in to let Dr [name] know that [facility] is not making Pt wife happy.  She is wanting to request home health with [facility].  Wife stated that [facility] employee parked in the wrong location.

## 2017-09-24 ENCOUNTER — Observation Stay (HOSPITAL_COMMUNITY)
Admission: EM | Admit: 2017-09-24 | Discharge: 2017-09-25 | Disposition: A | Discharge status: Home / Self Care | Payer: Medicare Other | Attending: Family Medicine | Admitting: Family Medicine

## 2017-09-24 ENCOUNTER — Encounter (HOSPITAL_COMMUNITY): Payer: Self-pay | Admitting: Emergency Medicine

## 2017-09-24 ENCOUNTER — Emergency Department (HOSPITAL_COMMUNITY): Payer: Medicare Other

## 2017-09-24 DIAGNOSIS — N183 Chronic kidney disease, stage 3 (moderate): Secondary | ICD-10-CM | POA: Diagnosis not present

## 2017-09-24 DIAGNOSIS — Z85038 Personal history of other malignant neoplasm of large intestine: Secondary | ICD-10-CM | POA: Diagnosis not present

## 2017-09-24 DIAGNOSIS — N179 Acute kidney failure, unspecified: Secondary | ICD-10-CM | POA: Insufficient documentation

## 2017-09-24 DIAGNOSIS — Z66 Do not resuscitate: Secondary | ICD-10-CM | POA: Insufficient documentation

## 2017-09-24 DIAGNOSIS — Z7982 Long term (current) use of aspirin: Secondary | ICD-10-CM | POA: Diagnosis not present

## 2017-09-24 DIAGNOSIS — I441 Atrioventricular block, second degree: Secondary | ICD-10-CM | POA: Insufficient documentation

## 2017-09-24 DIAGNOSIS — E785 Hyperlipidemia, unspecified: Secondary | ICD-10-CM | POA: Insufficient documentation

## 2017-09-24 DIAGNOSIS — Z86718 Personal history of other venous thrombosis and embolism: Secondary | ICD-10-CM | POA: Insufficient documentation

## 2017-09-24 DIAGNOSIS — R4020 Unspecified coma: Secondary | ICD-10-CM | POA: Diagnosis not present

## 2017-09-24 DIAGNOSIS — F0390 Unspecified dementia without behavioral disturbance: Secondary | ICD-10-CM | POA: Diagnosis not present

## 2017-09-24 DIAGNOSIS — N400 Enlarged prostate without lower urinary tract symptoms: Secondary | ICD-10-CM | POA: Insufficient documentation

## 2017-09-24 DIAGNOSIS — Z87891 Personal history of nicotine dependence: Secondary | ICD-10-CM | POA: Diagnosis not present

## 2017-09-24 DIAGNOSIS — Z880 Allergy status to penicillin: Secondary | ICD-10-CM | POA: Insufficient documentation

## 2017-09-24 DIAGNOSIS — R55 Syncope and collapse: Principal | ICD-10-CM | POA: Insufficient documentation

## 2017-09-24 DIAGNOSIS — I129 Hypertensive chronic kidney disease with stage 1 through stage 4 chronic kidney disease, or unspecified chronic kidney disease: Secondary | ICD-10-CM | POA: Insufficient documentation

## 2017-09-24 DIAGNOSIS — R001 Bradycardia, unspecified: Secondary | ICD-10-CM | POA: Diagnosis not present

## 2017-09-24 DIAGNOSIS — R05 Cough: Secondary | ICD-10-CM | POA: Diagnosis not present

## 2017-09-24 DIAGNOSIS — J849 Interstitial pulmonary disease, unspecified: Secondary | ICD-10-CM | POA: Diagnosis not present

## 2017-09-24 DIAGNOSIS — R4781 Slurred speech: Secondary | ICD-10-CM | POA: Diagnosis not present

## 2017-09-24 DIAGNOSIS — J449 Chronic obstructive pulmonary disease, unspecified: Secondary | ICD-10-CM | POA: Diagnosis not present

## 2017-09-24 DIAGNOSIS — I452 Bifascicular block: Secondary | ICD-10-CM | POA: Diagnosis not present

## 2017-09-24 DIAGNOSIS — R1111 Vomiting without nausea: Secondary | ICD-10-CM | POA: Diagnosis not present

## 2017-09-24 DIAGNOSIS — R42 Dizziness and giddiness: Secondary | ICD-10-CM | POA: Diagnosis not present

## 2017-09-24 DIAGNOSIS — D631 Anemia in chronic kidney disease: Secondary | ICD-10-CM | POA: Diagnosis present

## 2017-09-24 DIAGNOSIS — I10 Essential (primary) hypertension: Secondary | ICD-10-CM | POA: Diagnosis present

## 2017-09-24 DIAGNOSIS — Z79899 Other long term (current) drug therapy: Secondary | ICD-10-CM | POA: Diagnosis not present

## 2017-09-24 DIAGNOSIS — N189 Chronic kidney disease, unspecified: Secondary | ICD-10-CM

## 2017-09-24 LAB — CBC WITH DIFFERENTIAL/PLATELET
Abs Immature Granulocytes: 0 10*3/uL (ref 0.0–0.1)
BASOS ABS: 0 10*3/uL (ref 0.0–0.1)
Basophils Relative: 0 %
EOS ABS: 0.1 10*3/uL (ref 0.0–0.7)
EOS PCT: 2 %
HEMATOCRIT: 33.8 % — AB (ref 39.0–52.0)
Hemoglobin: 10.6 g/dL — ABNORMAL LOW (ref 13.0–17.0)
IMMATURE GRANULOCYTES: 1 %
LYMPHS ABS: 0.9 10*3/uL (ref 0.7–4.0)
LYMPHS PCT: 20 %
MCH: 31.3 pg (ref 26.0–34.0)
MCHC: 31.4 g/dL (ref 30.0–36.0)
MCV: 99.7 fL (ref 78.0–100.0)
Monocytes Absolute: 0.4 10*3/uL (ref 0.1–1.0)
Monocytes Relative: 10 %
Neutro Abs: 2.8 10*3/uL (ref 1.7–7.7)
Neutrophils Relative %: 67 %
Platelets: 153 10*3/uL (ref 150–400)
RBC: 3.39 MIL/uL — AB (ref 4.22–5.81)
RDW: 15.4 % (ref 11.5–15.5)
WBC: 4.2 10*3/uL (ref 4.0–10.5)

## 2017-09-24 LAB — ETHANOL

## 2017-09-24 LAB — COMPREHENSIVE METABOLIC PANEL
ALT: 16 U/L (ref 0–44)
AST: 22 U/L (ref 15–41)
Albumin: 3.1 g/dL — ABNORMAL LOW (ref 3.5–5.0)
Alkaline Phosphatase: 73 U/L (ref 38–126)
Anion gap: 9 (ref 5–15)
BUN: 32 mg/dL — ABNORMAL HIGH (ref 8–23)
CALCIUM: 9 mg/dL (ref 8.9–10.3)
CHLORIDE: 111 mmol/L (ref 98–111)
CO2: 21 mmol/L — ABNORMAL LOW (ref 22–32)
CREATININE: 1.81 mg/dL — AB (ref 0.61–1.24)
GFR, EST AFRICAN AMERICAN: 34 mL/min — AB (ref >60)
GFR, EST NON AFRICAN AMERICAN: 29 mL/min — AB (ref >60)
Glucose, Bld: 107 mg/dL — ABNORMAL HIGH (ref 70–99)
Potassium: 4.7 mmol/L (ref 3.5–5.1)
Sodium: 141 mmol/L (ref 135–145)
Total Bilirubin: 0.8 mg/dL (ref 0.3–1.2)
Total Protein: 5.7 g/dL — ABNORMAL LOW (ref 6.5–8.1)

## 2017-09-24 LAB — URINALYSIS, ROUTINE W REFLEX MICROSCOPIC
Bilirubin Urine: NEGATIVE
Glucose, UA: NEGATIVE mg/dL
Hgb urine dipstick: NEGATIVE
Ketones, ur: NEGATIVE mg/dL
LEUKOCYTES UA: NEGATIVE
Nitrite: NEGATIVE
PH: 6 (ref 5.0–8.0)
PROTEIN: NEGATIVE mg/dL
SPECIFIC GRAVITY, URINE: 1.017 (ref 1.005–1.030)

## 2017-09-24 LAB — RAPID URINE DRUG SCREEN, HOSP PERFORMED
AMPHETAMINES: NOT DETECTED
Barbiturates: NOT DETECTED
Benzodiazepines: NOT DETECTED
Cocaine: NOT DETECTED
OPIATES: NOT DETECTED
Tetrahydrocannabinol: NOT DETECTED

## 2017-09-24 LAB — I-STAT TROPONIN, ED: TROPONIN I, POC: 0.01 ng/mL (ref 0.00–0.08)

## 2017-09-24 LAB — LIPASE, BLOOD: Lipase: 30 U/L (ref 11–51)

## 2017-09-24 LAB — TROPONIN I: Troponin I: <0.03 ng/mL (ref <0.03)

## 2017-09-24 LAB — PROCALCITONIN: Procalcitonin: <0.1 ng/mL

## 2017-09-24 MED ORDER — ACETAMINOPHEN 650 MG RE SUPP: 650.0000 mg | Freq: Four times a day (QID) | RECTAL | Status: DC | PRN

## 2017-09-24 MED ORDER — ONDANSETRON HCL 4 MG PO TABS: 4.0000 mg | ORAL_TABLET | Freq: Four times a day (QID) | ORAL | Status: DC | PRN

## 2017-09-24 MED ORDER — SODIUM CHLORIDE 0.9 % IV BOLUS
1000.0000 mL | Freq: Once | INTRAVENOUS | Status: AC
  Administered 2017-09-24: 1000 mL via INTRAVENOUS

## 2017-09-24 MED ORDER — BISACODYL 10 MG RE SUPP: 10.0000 mg | Freq: Every day | RECTAL | Status: DC | PRN

## 2017-09-24 MED ORDER — DOCUSATE SODIUM 100 MG PO CAPS: 100.0000 mg | ORAL_CAPSULE | Freq: Two times a day (BID) | ORAL | Status: DC

## 2017-09-24 MED ORDER — SODIUM CHLORIDE 0.9% FLUSH: 3.0000 mL | Freq: Two times a day (BID) | INTRAVENOUS | Status: DC

## 2017-09-24 MED ORDER — DOXAZOSIN MESYLATE 2 MG PO TABS
2.0000 mg | ORAL_TABLET | Freq: Every day | ORAL | Status: DC
  Filled 2017-09-24 (×2): qty 1

## 2017-09-24 MED ORDER — METRONIDAZOLE IN NACL 5-0.79 MG/ML-% IV SOLN
500.0000 mg | Freq: Once | INTRAVENOUS | Status: AC
  Administered 2017-09-24: 500 mg via INTRAVENOUS
  Filled 2017-09-24: qty 100

## 2017-09-24 MED ORDER — ONDANSETRON HCL 4 MG/2ML IJ SOLN: 4.0000 mg | Freq: Four times a day (QID) | INTRAMUSCULAR | Status: DC | PRN

## 2017-09-24 MED ORDER — ASPIRIN 81 MG PO CHEW: 81.0000 mg | CHEWABLE_TABLET | Freq: Every day | ORAL | Status: DC

## 2017-09-24 MED ORDER — SODIUM CHLORIDE 0.9 % IV SOLN: 2.0000 g | Freq: Once | INTRAVENOUS | Status: DC

## 2017-09-24 MED ORDER — ACETAMINOPHEN 325 MG PO TABS: 650.0000 mg | ORAL_TABLET | Freq: Four times a day (QID) | ORAL | Status: DC | PRN

## 2017-09-24 MED ORDER — LACTATED RINGERS IV SOLN
INTRAVENOUS | Status: DC
  Administered 2017-09-24: 20:00:00 via INTRAVENOUS
  Administered 2017-09-25: 1000 mL via INTRAVENOUS

## 2017-09-24 MED ORDER — SODIUM CHLORIDE 0.9 % IV SOLN
1.0000 g | Freq: Every day | INTRAVENOUS | Status: DC
  Administered 2017-09-24: 1 g via INTRAVENOUS
  Filled 2017-09-24 (×2): qty 1

## 2017-09-24 MED ORDER — HYDRALAZINE HCL 20 MG/ML IJ SOLN: 5.0000 mg | INTRAMUSCULAR | Status: DC | PRN

## 2017-09-24 MED ORDER — ENOXAPARIN SODIUM 30 MG/0.3ML ~~LOC~~ SOLN
30.0000 mg | SUBCUTANEOUS | Status: DC
  Administered 2017-09-24: 30 mg via SUBCUTANEOUS
  Filled 2017-09-24 (×2): qty 0.3

## 2017-09-24 MED ORDER — MAGNESIUM HYDROXIDE 400 MG/5ML PO SUSP: 30.0000 mL | Freq: Every day | ORAL | Status: DC | PRN

## 2017-09-24 MED ORDER — METRONIDAZOLE IN NACL 5-0.79 MG/ML-% IV SOLN
500.0000 mg | Freq: Three times a day (TID) | INTRAVENOUS | Status: DC
  Administered 2017-09-24 – 2017-09-25 (×2): 500 mg via INTRAVENOUS
  Filled 2017-09-24 (×2): qty 100

## 2017-09-24 MED ORDER — FLEET ENEMA 7-19 GM/118ML RE ENEM: 1.0000 | ENEMA | Freq: Once | RECTAL | Status: DC | PRN

## 2017-09-24 NOTE — ED Provider Notes (Signed)
[facility] EMERGENCY DEPARTMENT Provider Note   CSN: [id number] Arrival date & time: 09/24/17  1316     History   Chief Complaint Chief Complaint  Patient presents with  . Near Syncope    HPI Howard Padilla is a 84 y.o. male.  HPI   Howard Padilla is a 84 y.o. male, with a history of DVT, HTN,, dementia, and hyperlipidemia, presenting to the ED with vomiting and near syncope. Family states patient ate, went to lay down, started to vomit, and then had a near syncopal episode. Patient states he continues to feel nauseous and has an occasional cough.  Denies LOC, head injury, chest pain, shortness of breath, abdominal pain, urinary symptoms, recent illness, fever/chills, or any other complaints.   Past Medical History:  Diagnosis Date  . BPH (benign prostatic hypertrophy)   . Cancer ([place])    colon  . Dementia   . DVT (deep venous thrombosis) ([place])    with pulmonary embo.  Marland Kitchen Hyperlipidemia   . Hypertension     Patient Active Problem List   Diagnosis Date Noted  . Postural dizziness with presyncope 08/21/2017  . Bradycardia 07/14/2016  . Aphasia 01/06/2015  . Stroke ([place]) 01/06/2015  . Slurred speech   . TIA (transient ischemic attack)   . Urinary retention 12/25/2014  . Lung nodule 06/24/2013  . CAP (community acquired pneumonia) 06/23/2013  . 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 Lung abscess ([place]) 06/22/2013  . CKD (chronic kidney disease) stage 3, GFR 30-59 ml/min (H[facility]) 06/22/2013  . OTHER ABNORMAL BLOOD CHEMISTRY 02/03/2010  . PERSONAL HISTORY OF THROMBOPHLEBITIS 02/03/2010  . WEIGHT LOSS 10/29/2008  . DVT 06/24/2007  . LEG PAIN, LEFT 05/28/2007  . SYNCOPE 05/28/2007  . ACUTE BRONCHITIS 05/25/2007  . Anemia of renal disease 11/05/2006  . HLD (hyperlipidemia) 07/30/2006  . Essential hypertension 07/30/2006  . BPH (benign prostatic hyperplasia) 07/30/2006  . History of malignant neoplasm of large intestine 07/30/2006    Past  Surgical History:  Procedure Laterality Date  . COLON SURGERY     partial colectomy  . HERNIA REPAIR     ingunial        Home Medications    Prior to Admission medications   Medication Sig Start Date End Date Taking? Authorizing Provider  acetaminophen (TYLENOL) 500 MG tablet Take 2 tablets (1,000 mg total) by mouth every 6 (six) hours as needed. Patient taking differently: Take 1,000 mg by mouth every 6 (six) hours as needed for headache (pain).  08/25/17  Yes Emokpae, Courage, MD  amLODipine (NORVASC) 5 MG tablet Take 1 tablet (5 mg total) by mouth daily. 08/28/17  Yes [name], MD  aspirin 81 MG chewable tablet Chew 1 tablet (81 mg total) by mouth daily. With Breakfast 08/26/17  Yes Emokpae, Courage, MD  doxazosin (CARDURA) 4 MG tablet Take 0.5 tablets (2 mg total) by mouth daily. Patient taking differently: Take 2 mg by mouth at bedtime.  08/28/17  Yes [name], MD  metoprolol tartrate (LOPRESSOR) 25 MG tablet Take 0.5 tablets (12.5 mg total) by mouth 2 (two) times daily. 08/25/17  Yes Roxan Hockey, MD  Multiple Vitamin (MULTIVITAMIN WITH MINERALS) TABS tablet Take 1 tablet by mouth daily.   Yes [provider]  ondansetron (ZOFRAN ODT) 4 MG disintegrating tablet Take 1 tablet (4 mg total) by mouth every 8 (eight) hours as needed for nausea or vomiting. 08/25/17  Yes Emokpae, Courage, MD  vitamin C (ASCORBIC ACID) 500 MG tablet Take  500 mg by mouth daily.   Yes [provider]  Guaifenesin 200 MG/5ML LIQD Take 10 mLs (400 mg total) by mouth every 4 (four) hours as needed. Patient not taking: Reported on 09/24/2017 08/25/17   Roxan Hockey, MD    Family History Family History  Family history unknown: Yes    Social History Social History   Tobacco Use  . Smoking status: Former Smoker    Packs/day: 2.00    Years: 30.00    Pack years: 60.00    Types: Cigarettes    Last attempt to quit: 02/28/1983    Years since quitting: 34.5  .  Smokeless tobacco: Never Used  Substance Use Topics  . Alcohol use: No  . Drug use: No     Allergies   Penicillins   Review of Systems Review of Systems  Constitutional: Negative for chills and fever.  Respiratory: Negative for cough and shortness of breath.   Cardiovascular: Negative for chest pain.  Gastrointestinal: Positive for nausea and vomiting. Negative for abdominal pain, blood in stool and diarrhea.  Genitourinary: Negative for dysuria and hematuria.  Neurological: Positive for syncope (near syncope) and weakness (generalized).  All other systems reviewed and are negative.    Physical Exam Updated Vital Signs BP (!) 104/55 (BP Location: Right Arm)   Pulse 92   Temp 97.6 F (36.4 C) (Oral)   Resp 18   SpO2 95%   Physical Exam  Constitutional: He appears well-developed and well-nourished. No distress.  HENT:  Head: Normocephalic and atraumatic.  Eyes: Pupils are equal, round, and reactive to light. Conjunctivae and EOM are normal.  Neck: Neck supple.  Cardiovascular: Normal rate, regular rhythm, normal heart sounds and intact distal pulses.  Pulmonary/Chest: Effort normal and breath sounds normal. No respiratory distress.  Abdominal: Soft. There is no tenderness. There is no guarding.  Musculoskeletal: He exhibits no edema.  Lymphadenopathy:    He has no cervical adenopathy.  Neurological: He is alert.  Sensation grossly intact to light touch in all four extremities. Strength 5/5 in all extremities. No gait disturbance. Coordination intact. Cranial nerves III-XII grossly intact. No facial droop.   Skin: Skin is warm and dry. He is not diaphoretic.  Psychiatric: He has a normal mood and affect. His behavior is normal.  Nursing note and vitals reviewed.    ED Treatments / Results  Labs (all labs ordered are listed, but only abnormal results are displayed) Labs Reviewed  COMPREHENSIVE METABOLIC PANEL - Abnormal; Notable for the following components:       Result Value   CO2 21 (*)    Glucose, Bld 107 (*)    BUN 32 (*)    Creatinine, Ser 1.81 (*)    Total Protein 5.7 (*)    Albumin 3.1 (*)    GFR calc non Af Amer 29 (*)    GFR calc Af Amer 34 (*)    All other components within normal limits  CBC WITH DIFFERENTIAL/PLATELET - Abnormal; Notable for the following components:   RBC 3.39 (*)    Hemoglobin 10.6 (*)    HCT 33.8 (*)    All other components within normal limits  ETHANOL  LIPASE, BLOOD  RAPID URINE DRUG SCREEN, HOSP PERFORMED  URINALYSIS, ROUTINE W REFLEX MICROSCOPIC  I-STAT TROPONIN, ED    EKG EKG Interpretation  Date/Time:  Monday September 24 2017 13:52:04 EDT Ventricular Rate:  65 PR Interval:    QRS Duration: 110 QT Interval:  426 QTC Calculation: 443 R Axis:   -  58 Text Interpretation:  Sinus rhythm Prolonged PR interval Incomplete RBBB and LAFB Consider RVH w/ secondary repol abnormality Probable anteroseptal infarct, old Nonspecific T abnormalities, lateral leads Since last EKG, TWI is new in inferolateral leads Confirmed by Duffy Bruce [phone]) on 09/24/2017 2:04:42 PM   Radiology Dg Chest 2 View  Result Date: 09/24/2017 CLINICAL DATA:  Near syncope.  History of colon cancer. EXAM: CHEST - 2 VIEW COMPARISON:  08/25/2017 FINDINGS: COPD with hyperinflation. Bibasilar airspace disease with slight interval improvement. Underlying scarring in the bases based on prior studies. Negative for effusion. Apical emphysema bilaterally. Heart size and vascularity normal. IMPRESSION: COPD. Bibasilar airspace disease with mild interval improvement. Superimposed bibasilar atelectasis/infiltrate based on prior studies. Electronically Signed   By: Franchot Gallo M.D.   On: 09/24/2017 14:47    Procedures Procedures (including critical care time)  Medications Ordered in ED Medications  sodium chloride 0.9 % bolus 1,000 mL (1,000 mLs Intravenous New Bag/Given 09/24/17 1525)  cefTRIAXone (ROCEPHIN) 2 g in sodium chloride 0.9 % 100 mL  IVPB (has no administration in time range)  metroNIDAZOLE (FLAGYL) IVPB 500 mg (has no administration in time range)     Initial Impression / Assessment and Plan / ED Course  I have reviewed the triage vital signs and the nursing notes.  Pertinent labs & imaging results that were available during my care of the patient were reviewed by me and considered in my medical decision making (see chart for details).     Patient presents with an episode of vomiting and reported near syncope.  Patient may have aspirated.  He has no current complaints other than nausea and an occasional cough.  End of shift patient care handoff report given to Alford Highland, EM resident. Plan: IV fluids, second troponin around 1700, orthostatics, ambulate, treat for possible aspiration, likely discharge home. Patient has declined admission.  Findings and plan of care discussed with Duffy Bruce, MD. Dr. Ellender Hose personally evaluated and examined this patient.    Final Clinical Impressions(s) / ED Diagnoses   Final diagnoses:  AKI (acute kidney injury) [facility])    ED Discharge Orders    None       Layla Maw 09/24/17 1622    Duffy Bruce, MD 09/25/17 [phone]

## 2017-09-24 NOTE — ED Notes (Signed)
Attempted report 

## 2017-09-24 NOTE — ED Provider Notes (Signed)
Transfer of Care Note I assumed care of Howard Padilla on 09/24/2017 at 3:44 PM   Briefly, Howard Padilla is a 84 y.o. male who:  Weak feeling, vomiting, near syncope  T wave abnormalities (II, III, AVF, V5, V6)  AKI (Cr 1.8)  Likely aspirated - vomiting while lying flat, has a history of aspiration  Getting antibiotics  The plan includes:  F/u urine studies  Getting IVF  Repeat troponin at 5 PM  Likely discharge home - does not want to be admitted  Will need vantin and flagyl when discharged     Please refer to the original provider's note for additional information regarding the care of Howard Padilla.  ### Reassessment: I personally reassessed the patient:  Vital Signs:  The most current vitals were  Vitals:   09/24/17 1321  BP: (!) 104/55  Pulse: 92  Resp: 18  Temp: 97.6 F (36.4 C)  SpO2: 95%    Hemodynamics:  The patient is hemodynamically stable. Mental Status:  The patient is Alert  Additional MDM: When standing, the patient's heart rate dropped to 34.  He remained awake during this time.  He is on Lopressor.  The patient and his family previously declined admission.  I reviewed with them that he currently has an AKI, possible aspiration pneumonia, and is now having episodes of bradycardia.  They agreed with being admitted.  He was admitted to the hospitalist service.  The care of this patient was supervised by Dr. Venora Maples, who agreed with the plan and management of the patient.    Alford Highland, MD 09/25/17 Dyann Kief    Jola Schmidt, MD 09/25/17 [phone]

## 2017-09-24 NOTE — ED Notes (Signed)
Patient transported to X-ray 

## 2017-09-24 NOTE — ED Notes (Signed)
Could not complete orthostatic vital signs. Pt heartrate dropped to 34 and pt stopped responding to this NT. This NT sat pt back down and after 20-30 seconds pt began responding again at baseline. Larkin Ina, RN at bedside. Venora Maples, MD made aware

## 2017-09-24 NOTE — Progress Notes (Signed)
Pharmacy Antibiotic Note  Howard Padilla is a 84 y.o. male admitted on 09/24/2017 with pneumonia.  Pharmacy has been consulted for cefepime dosing.  Plan: Cefepime 1G IV QD Flagyl 50mg  IV q8 hours Monitor clinical progression and LOT     Temp (24hrs), Avg:97.6 F (36.4 C), Min:97.6 F (36.4 C), Max:97.6 F (36.4 C)  Recent Labs  Lab 09/24/17 1356  WBC 4.2  CREATININE 1.81*    CrCl cannot be calculated (Unknown ideal weight.).    Allergies  Allergen Reactions  . Penicillins Rash and Other (See Comments)    Has patient had a PCN reaction causing immediate rash, facial/tongue/throat swelling, SOB or lightheadedness with hypotension: No Has patient had a PCN reaction causing severe rash involving mucus membranes or skin necrosis: No Has patient had a PCN reaction that required hospitalization No Has patient had a PCN reaction occurring within the last 10 years: No If all of the above answers are "NO", then may proceed with Cephalosporin use.    Thank you for allowing pharmacy to be a part of this patient's care.  Howard Padilla 09/24/2017 6:11 PM

## 2017-09-24 NOTE — ED Triage Notes (Signed)
Pt arrives from home after eating lunch began to have an episode of vomiting and feeling weak, family reports a near syncope. Pt arrives to ED alert but disoriented.

## 2017-09-24 NOTE — H&P (Signed)
History and Physical    Howard Padilla ZOX:[id number] DOB: 1915-07-31 DOA: 09/24/2017  PCP: [name], MD Patient coming from:  Home  Chief Complaint: Near syncope  HPI: Howard Padilla is a 84 y.o. male with medical history significant of HTN, HLD, DVT/PE, dementia, colon CA, and BPH presenting with presyncope. He is unable to answer questions due to dementia and is unaccompanied at this time.  HPI per ER provider: [name] is a 84 y.o. male, with a history of DVT, HTN,, dementia, and hyperlipidemia, presenting to the ED with vomiting and near syncope. Family states patient ate, went to lay down, started to vomit, and then had a near syncopal episode. Patient states he continues to feel nauseous and has an occasional cough.  Denies LOC, head injury, chest pain, shortness of breath, abdominal pain, urinary symptoms, recent illness, fever/chills, or any other complaints.  He was admitted for the same reason from 6/25-29.  He was found to have CAP and treated with Rocephin/Azithromycin.    ED Course:  Near syncope, AKI, bradycardia, ?aspiration PNA.  Emesis this AM - he was lying down, ?aspiration.  Some SOB and cough since.  He got Rocephin and Flagyl.  Creatinine is slightly above baseline.  He was refusing admission.  Stood up, HR dropped to 35-36 and he became pre-syncopal.    Review of Systems:  Unable to perform  Past Medical History:  Diagnosis Date  . BPH (benign prostatic hypertrophy)   . Cancer ([place])    colon  . Dementia   . DVT (deep venous thrombosis) ([place])    with pulmonary embo.  Marland Kitchen Hyperlipidemia   . Hypertension     Past Surgical History:  Procedure Laterality Date  . COLON SURGERY     partial colectomy  . HERNIA REPAIR     ingunial    Social History   Socioeconomic History  . Marital status: Married    Spouse name: Not on file  . Number of children: Not on file  . Years of education: Not on file  . Highest education level: Not on file    Occupational History  . Not on file  Social Needs  . Financial resource strain: Not on file  . Food insecurity:    Worry: Not on file    Inability: Not on file  . Transportation needs:    Medical: Not on file    Non-medical: Not on file  Tobacco Use  . Smoking status: Former Smoker    Packs/day: 2.00    Years: 30.00    Pack years: 60.00    Types: Cigarettes    Last attempt to quit: 02/28/1983    Years since quitting: 34.5  . Smokeless tobacco: Never Used  Substance and Sexual Activity  . Alcohol use: No  . Drug use: No  . Sexual activity: Not on file  Lifestyle  . Physical activity:    Days per week: Not on file    Minutes per session: Not on file  . Stress: Not on file  Relationships  . Social connections:    Talks on phone: Not on file    Gets together: Not on file    Attends religious service: Not on file    Active member of club or organization: Not on file    Attends meetings of clubs or organizations: Not on file    Relationship status: Not on file  . Intimate partner violence:    Fear of current or ex partner: Not on file  Emotionally abused: Not on file    Physically abused: Not on file    Forced sexual activity: Not on file  Other Topics Concern  . Not on file  Social History Narrative  . Not on file    Allergies  Allergen Reactions  . Penicillins Rash and Other (See Comments)    Has patient had a PCN reaction causing immediate rash, facial/tongue/throat swelling, SOB or lightheadedness with hypotension: No Has patient had a PCN reaction causing severe rash involving mucus membranes or skin necrosis: No Has patient had a PCN reaction that required hospitalization No Has patient had a PCN reaction occurring within the last 10 years: No If all of the above answers are "NO", then may proceed with Cephalosporin use.     Family History  Family history unknown: Yes    Prior to Admission medications   Medication Sig Start Date End Date Taking?  Authorizing Provider  acetaminophen (TYLENOL) 500 MG tablet Take 2 tablets (1,000 mg total) by mouth every 6 (six) hours as needed. Patient taking differently: Take 1,000 mg by mouth every 6 (six) hours as needed for headache (pain).  08/25/17  Yes Emokpae, Courage, MD  amLODipine (NORVASC) 5 MG tablet Take 1 tablet (5 mg total) by mouth daily. 08/28/17  Yes [name], MD  aspirin 81 MG chewable tablet Chew 1 tablet (81 mg total) by mouth daily. With Breakfast 08/26/17  Yes Emokpae, Courage, MD  doxazosin (CARDURA) 4 MG tablet Take 0.5 tablets (2 mg total) by mouth daily. Patient taking differently: Take 2 mg by mouth at bedtime.  08/28/17  Yes [name], MD  metoprolol tartrate (LOPRESSOR) 25 MG tablet Take 0.5 tablets (12.5 mg total) by mouth 2 (two) times daily. 08/25/17  Yes Roxan Hockey, MD  Multiple Vitamin (MULTIVITAMIN WITH MINERALS) TABS tablet Take 1 tablet by mouth daily.   Yes [provider]  ondansetron (ZOFRAN ODT) 4 MG disintegrating tablet Take 1 tablet (4 mg total) by mouth every 8 (eight) hours as needed for nausea or vomiting. 08/25/17  Yes Emokpae, Courage, MD  vitamin C (ASCORBIC ACID) 500 MG tablet Take 500 mg by mouth daily.   Yes [provider]  Guaifenesin 200 MG/5ML LIQD Take 10 mLs (400 mg total) by mouth every 4 (four) hours as needed. Patient not taking: Reported on 09/24/2017 08/25/17   Roxan Hockey, MD    Physical Exam: Vitals:   09/24/17 1321  BP: (!) 104/55  Pulse: 92  Resp: 18  Temp: 97.6 F (36.4 C)  TempSrc: Oral  SpO2: 95%     General:  Appears calm and comfortable and is NAD Eyes:  PERRL, EOMI, normal lids, iris ENT:  grossly normal hearing, lips & tongue, mmm Neck:  no LAD, masses or thyromegaly Cardiovascular:  RRR, no m/r/g. No LE edema.  Respiratory:   CTA bilaterally with no wheezes/rales/rhonchi.  Normal respiratory effort. Abdomen:  soft, NT, ND, NABS Back:   normal alignment, no CVAT Skin:  no  rash or induration seen on limited exam Musculoskeletal:  grossly normal tone BUE/BLE, good ROM, no bony abnormality Psychiatric: He is alert and pleasant but generally unable to converse due to his dementia.  He expresses appreciation for his care. Neurologic: Unable to assess   Radiological Exams on Admission: Dg Chest 2 View  Result Date: 09/24/2017 CLINICAL DATA:  Near syncope.  History of colon cancer. EXAM: CHEST - 2 VIEW COMPARISON:  08/25/2017 FINDINGS: COPD with hyperinflation. Bibasilar airspace disease with slight interval  improvement. Underlying scarring in the bases based on prior studies. Negative for effusion. Apical emphysema bilaterally. Heart size and vascularity normal. IMPRESSION: COPD. Bibasilar airspace disease with mild interval improvement. Superimposed bibasilar atelectasis/infiltrate based on prior studies. Electronically Signed   By: Franchot Gallo M.D.   On: 09/24/2017 14:47    EKG: Independently reviewed.  NSR with rate 65; nonspecific ST changes with no evidence of acute ischemia   Labs on Admission: I have personally reviewed the available labs and imaging studies at the time of the admission.  Pertinent labs:   Glucose 107 BUN 32/Creatinine 1.81/GFR 29; prior 25/1.46/38 on 6/29 Albumin 3.1 Troponin 0.01 Hgb 10.6 - stable   Assessment/Plan Principal Problem:   Near syncope Active Problems:   Anemia of renal disease   Essential hypertension   Slurred speech   Bradycardia   Acute kidney injury superimposed on chronic kidney disease ([place])   Near syncope -Patient presenting with an apparent episode of near syncope -Family preferred outpatient management, but he once again became presyncopal in the ER when his HR dropped to 35 with standing (see below) -He also has AKI -Will admit for now and continue to evaluate -Will monitor on telemetry -Orthostatic vital signs in AM -Neuro checks   Bradycardia -Patient is on a rate-controlling agent (beta  blocker) -Will hold both Lopressor (discontinued); and Norvasc -If patient continues to have symptomatic bradycardia once the effects of the beta blocker are worn off, he may need EP evaluation at that time  AKI on stage 3 CKD -Mild renal insufficiency worsening -Likely associated with decreased PO intake in the setting of possible aspiration event yesterday -Rehydrate and follow daily BMP  Anemia of CKD -Likely associated with CKD -Appears to be stable -Recheck CBC in AM  CVA, residual aphasia -Concern raised by family for possible aspiration -CXR is less concerning for this -Will continue antibiotics for now -Check procalcitonin -NPO while awaiting speech therapy swallow evaluation  HTN -Continue Cardura -Discontinue lopressor -Hold Norvasc -Cover with prn IV hydralazine for now  DVT prophylaxis: Lovenox  Code Status:  Full - no family present, this needs to be addressed when they are available Family Communication: None present  Disposition Plan:  Home once clinically improved Consults called: None Admission status: Admit - It is my clinical opinion that admission to INPATIENT is reasonable and necessary because of the expectation that this patient will require hospital care that crosses at least 2 midnights to treat this condition based on the medical complexity of the problems presented.  Given the aforementioned information, the predictability of an adverse outcome is felt to be significant.    Karmen Bongo MD [facility]  If note is complete, please contact covering daytime or nighttime physician. [email or web address] Password [facility]  09/24/2017, 5:35 PM

## 2017-09-25 DIAGNOSIS — Z66 Do not resuscitate: Secondary | ICD-10-CM | POA: Diagnosis not present

## 2017-09-25 DIAGNOSIS — N189 Chronic kidney disease, unspecified: Secondary | ICD-10-CM

## 2017-09-25 DIAGNOSIS — R4781 Slurred speech: Secondary | ICD-10-CM

## 2017-09-25 DIAGNOSIS — R55 Syncope and collapse: Secondary | ICD-10-CM

## 2017-09-25 DIAGNOSIS — I10 Essential (primary) hypertension: Secondary | ICD-10-CM

## 2017-09-25 DIAGNOSIS — R001 Bradycardia, unspecified: Secondary | ICD-10-CM | POA: Diagnosis not present

## 2017-09-25 DIAGNOSIS — I441 Atrioventricular block, second degree: Secondary | ICD-10-CM | POA: Diagnosis not present

## 2017-09-25 DIAGNOSIS — I129 Hypertensive chronic kidney disease with stage 1 through stage 4 chronic kidney disease, or unspecified chronic kidney disease: Secondary | ICD-10-CM | POA: Diagnosis not present

## 2017-09-25 DIAGNOSIS — N179 Acute kidney failure, unspecified: Secondary | ICD-10-CM | POA: Diagnosis not present

## 2017-09-25 DIAGNOSIS — E785 Hyperlipidemia, unspecified: Secondary | ICD-10-CM | POA: Diagnosis not present

## 2017-09-25 DIAGNOSIS — F0390 Unspecified dementia without behavioral disturbance: Secondary | ICD-10-CM | POA: Diagnosis not present

## 2017-09-25 DIAGNOSIS — D631 Anemia in chronic kidney disease: Secondary | ICD-10-CM

## 2017-09-25 DIAGNOSIS — N183 Chronic kidney disease, stage 3 (moderate): Secondary | ICD-10-CM | POA: Diagnosis not present

## 2017-09-25 DIAGNOSIS — N400 Enlarged prostate without lower urinary tract symptoms: Secondary | ICD-10-CM | POA: Diagnosis not present

## 2017-09-25 DIAGNOSIS — I452 Bifascicular block: Secondary | ICD-10-CM | POA: Diagnosis not present

## 2017-09-25 LAB — CBC
HCT: 33.2 % — ABNORMAL LOW (ref 39.0–52.0)
Hemoglobin: 10.9 g/dL — ABNORMAL LOW (ref 13.0–17.0)
MCH: 31.1 pg (ref 26.0–34.0)
MCHC: 32.8 g/dL (ref 30.0–36.0)
MCV: 94.9 fL (ref 78.0–100.0)
PLATELETS: 141 10*3/uL — AB (ref 150–400)
RBC: 3.5 MIL/uL — AB (ref 4.22–5.81)
RDW: 15.2 % (ref 11.5–15.5)
WBC: 8.9 10*3/uL (ref 4.0–10.5)

## 2017-09-25 LAB — BASIC METABOLIC PANEL
Anion gap: 5 (ref 5–15)
BUN: 24 mg/dL — AB (ref 8–23)
CO2: 24 mmol/L (ref 22–32)
CREATININE: 1.42 mg/dL — AB (ref 0.61–1.24)
Calcium: 8.8 mg/dL — ABNORMAL LOW (ref 8.9–10.3)
Chloride: 110 mmol/L (ref 98–111)
GFR, EST AFRICAN AMERICAN: 45 mL/min — AB (ref >60)
GFR, EST NON AFRICAN AMERICAN: 39 mL/min — AB (ref >60)
Glucose, Bld: 84 mg/dL (ref 70–99)
POTASSIUM: 4.5 mmol/L (ref 3.5–5.1)
SODIUM: 139 mmol/L (ref 135–145)

## 2017-09-25 NOTE — Care Management CC44 (Signed)
Condition Code 44 Documentation Completed  Patient Details  Name: Howard Padilla MRN: [id number] Date of Birth: 1915-06-25   Condition Code 44 given:  Yes Patient signature on Condition Code 44 notice:  Yes Documentation of 2 MD's agreement:  Yes Code 44 added to claim:  Yes    Sharin Mons, RN 09/25/2017, 2:48 PM

## 2017-09-25 NOTE — Care Management Obs Status (Signed)
[facility] NOTIFICATION   Patient Details  Name: Howard Padilla MRN: [id number] Date of Birth: Mar 18, 1915   Medicare Observation Status Notification Given:  Yes    Sharin Mons, RN 09/25/2017, 2:48 PM

## 2017-09-25 NOTE — Care Management Note (Signed)
Case Management Note  Patient Details  Name: Howard Padilla MRN: [id number] Date of Birth: 02/25/1916  Subjective/Objective:        Presented to the ED on 09/24/17 with near syncope. PMH significant for HTN, HLD, DVT/PE, dementia, colon CA, and BPH. From home wife, [name].       XION DEBRUYNE (Spouse) Talbot Grumbling (Daughter)    [phone]       Action/Plan: Transition to home with home health services to follow. Wife declined SNF placement.  Expected Discharge Date:  09/25/17               Expected Discharge Plan:  [place]  In-House Referral:     Discharge planning Services  CM Consult  Post Acute Care Choice:    Choice offered to:  Spouse  DME Arranged:   N/A DME Agency:   N/A  HH Arranged:  PT, OT, Nurse's Aide [place] Agency:  [place]  Status of Service:  Completed, signed off  If discussed at [place] of Stay Meetings, dates discussed:    Additional Comments:  Sharin Mons, RN 09/25/2017, 3:05 PM

## 2017-09-25 NOTE — Evaluation (Signed)
Clinical/Bedside Swallow Evaluation Patient Details  Name: Howard Padilla MRN: [id number] Date of Birth: 1915-10-04  Today's Date: 09/25/2017 Time: SLP Start Time (ACUTE ONLY): 3428 SLP Stop Time (ACUTE ONLY): 1222 SLP Time Calculation (min) (ACUTE ONLY): 18 min  Past Medical History:  Past Medical History:  Diagnosis Date  . BPH (benign prostatic hypertrophy)   . Cancer ([place])    colon  . Dementia   . DVT (deep venous thrombosis) ([place])    with pulmonary embo.  Marland Kitchen Hyperlipidemia   . Hypertension    Past Surgical History:  Past Surgical History:  Procedure Laterality Date  . COLON SURGERY     partial colectomy  . HERNIA REPAIR     ingunial   HPI:  84 y.o. male with medical history significant for HTN, HLD, DVT/PE, CVA and aphasia, dementia, colon CA, and BPH presenting with presyncope, vomited when lying down shortly after eating. Concerns for aspiration.  Dx AKI, bradycardia.     Assessment / Plan / Recommendation Clinical Impression  Pt presents with remarkably strong/efficient swallow c/b adequate mastication of solids (despite absence of teeth), brisk swallow response, no overt s/s of aspiration.  Wife present and states he generally has a good appetite and eats well.  Pt may have had event of post-prandial aspiration PTA, but does not present with any s/s of dysphagia.  Recommend resuming a regular diet, thin liquids.  Assist with tray set-up as needed. D/W RN.  No further SLP needs.  SLP Visit Diagnosis: Dysphagia, unspecified (R13.10)    Aspiration Risk  No limitations    Diet Recommendation   regular solids, thin liquids  Medication Administration: Whole meds with liquid    Other  Recommendations Oral Care Recommendations: Oral care BID   Follow up Recommendations        Frequency and Duration            Prognosis        Swallow Study   General HPI: 84 y.o. male with medical history significant for HTN, HLD, DVT/PE, CVA and aphasia, dementia, colon CA,  and BPH presenting with presyncope, vomited when lying down shortly after eating. Concerns for aspiration.  Dx AKI, bradycardia.   Type of Study: Bedside Swallow Evaluation Previous Swallow Assessment: no Diet Prior to this Study: NPO Temperature Spikes Noted: No History of Recent Intubation: No Behavior/Cognition: Alert;Cooperative Oral Cavity Assessment: Within Functional Limits Oral Care Completed by SLP: No Oral Cavity - Dentition: Edentulous Vision: Functional for self-feeding Self-Feeding Abilities: Needs assist;Able to feed self Patient Positioning: Upright in bed Baseline Vocal Quality: Normal Volitional Cough: Cognitively unable to elicit Volitional Swallow: Unable to elicit    Oral/Motor/Sensory Function Overall Oral Motor/Sensory Function: Within functional limits   Ice Chips Ice chips: Within functional limits   Thin Liquid Thin Liquid: Within functional limits Presentation: Straw    Nectar Thick Nectar Thick Liquid: Not tested   Honey Thick Honey Thick Liquid: Not tested   Puree Puree: Within functional limits   Solid     Solid: Within functional limits      Howard Padilla Howard Padilla 09/25/2017,12:27 PM

## 2017-09-25 NOTE — Discharge Summary (Signed)
Physician Discharge Summary  Howard Padilla [id number]:[id number] DOB: [birth year] DOA: 09/24/2017  PCP: [name], MD  Admit date: 09/24/2017 Discharge date: 09/25/2017  Admitted From: Home Disposition: Home   Recommendations for Outpatient Follow-up:  1. Follow up with PCP in 1-2 weeks. Did not have metoprolol on the medication list, though wife has been giving him this. Will STOP metoprolol.  Home Health: PT, OT, aide Equipment/Devices: None new Discharge Condition: Stable CODE STATUS: DNR confirmed with discussion with wife Diet recommendation: Regular (no restrictions per speech therapy)  Brief/Interim Summary: Howard Padilla is a 84 y.o. male with a history of dementia, DVT/PE, HTN on metoprolol and norvasc, HLD, colon CA, and BPH who presented to the ED by ambulance with near syncopal episode following an episode of vomiting. His wife provides the history that he was sitting in bed after just eating a meal and drinking a glass of orange juice when he had an episode of vomiting all stomach contents. He denies any nausea, abdominal pain, changes in bowel habits. Immediately following this he seemed "out of it" but responding to commands. Wife called EMS who recommended holding hands above his head and he quickly regained normal mentation. He was brought to the ED and found to have an elevation in creatinine from baseline. Due to concern for aspiration, ceftriaxone and flagyl were started. He has remained afebrile without respiratory symptoms and speech therapy evaluation reports he has no apparent dysphagia. He stood up in the emergency department and had an episode of presyncope with HR dropping into the 30's. He was subsequently admitted, norvasc and metoprolol were held.   Telemetry has revealed intermittent 2nd degree AV block with occasional dropped beats. This was discussed with cardiology who recommended supportive care, discontinuation of AV nodal blocking agents. The patient's  wife shares the care team's sentiments that a pacemaker should not be placed. He has worked with physical therapy and seems to be at his mental and functional baseline per his wife. PT has recommended a nursing facility for short term rehabilitation prior to return home, though the family and patient politely but adamantly decline this.   Discharge Diagnoses:  Principal Problem:   Near syncope Active Problems:   Anemia of renal disease   Essential hypertension   Slurred speech   Bradycardia   Acute kidney injury superimposed on chronic kidney disease ([place])  Near syncope: This is his third admission for the same, and there's strong suspicion for a combination of age-related conduction system compromise (1st and 2nd degree AV block), beta blocker, and increased vagal tone following meals (the suspected precipitant here).  - Symptoms have resolved with discontinuation of BB. - Not a candidate and family declines pacemaker.  - DC metoprolol - Follow up closely with PCP  HTN:  - Will allow some permissive HTN given age and risk for syncope. Restart norvasc.  AKI on stage III CKD: Improved with fluids. Taking good po now.  PCT undetectable, so will not continue antibiotics.   BPH: Continue cardura.  Discharge Instructions Discharge Instructions    Diet - low sodium heart healthy   Complete by:  As directed    Discharge instructions   Complete by:  As directed    Your heart is occasionally skipping beats which has improved without metoprolol, so you need to STOP taking metoprolol.   Follow up with Dr. [name] soon.  We will attempt to arrange home health through [facility] prior to discharge.   Increase activity slowly   Complete  by:  As directed      Allergies as of 09/25/2017      Reactions   Penicillins Rash, Other (See Comments)   Has patient had a PCN reaction causing immediate rash, facial/tongue/throat swelling, SOB or lightheadedness with hypotension: No Has patient had  a PCN reaction causing severe rash involving mucus membranes or skin necrosis: No Has patient had a PCN reaction that required hospitalization No Has patient had a PCN reaction occurring within the last 10 years: No If all of the above answers are "NO", then may proceed with Cephalosporin use.      Medication List    TAKE these medications   acetaminophen 500 MG tablet Commonly known as:  TYLENOL Take 2 tablets (1,000 mg total) by mouth every 6 (six) hours as needed. What changed:  reasons to take this   amLODipine 5 MG tablet Commonly known as:  NORVASC Take 1 tablet (5 mg total) by mouth daily.   aspirin 81 MG chewable tablet Chew 1 tablet (81 mg total) by mouth daily. With Breakfast   doxazosin 4 MG tablet Commonly known as:  CARDURA Take 0.5 tablets (2 mg total) by mouth daily. What changed:  when to take this   multivitamin with minerals Tabs tablet Take 1 tablet by mouth daily.   ondansetron 4 MG disintegrating tablet Commonly known as:  ZOFRAN ODT Take 1 tablet (4 mg total) by mouth every 8 (eight) hours as needed for nausea or vomiting.   vitamin C 500 MG tablet Commonly known as:  ASCORBIC ACID Take 500 mg by mouth daily.      Follow-up Information    [name], MD. Schedule an appointment as soon as possible for a visit in 1 week(s).   Specialty:  Internal Medicine Contact information: [place] [address] [phone]        [facility] Follow up.   Specialty:  Home Health Services Why:  home health services arranged Contact information: [place] [address] [phone]          Allergies  Allergen Reactions  . Penicillins Rash and Other (See Comments)    Has patient had a PCN reaction causing immediate rash, facial/tongue/throat swelling, SOB or lightheadedness with hypotension: No Has patient had a PCN reaction causing severe rash involving mucus membranes or  skin necrosis: No Has patient had a PCN reaction that required hospitalization No Has patient had a PCN reaction occurring within the last 10 years: No If all of the above answers are "NO", then may proceed with Cephalosporin use.     Consultations:  Cardiology by phone, Dr. Warren Lacy.   Procedures/Studies: Dg Chest 2 View  Result Date: 09/24/2017 CLINICAL DATA:  Near syncope.  History of colon cancer. EXAM: CHEST - 2 VIEW COMPARISON:  08/25/2017 FINDINGS: COPD with hyperinflation. Bibasilar airspace disease with slight interval improvement. Underlying scarring in the bases based on prior studies. Negative for effusion. Apical emphysema bilaterally. Heart size and vascularity normal. IMPRESSION: COPD. Bibasilar airspace disease with mild interval improvement. Superimposed bibasilar atelectasis/infiltrate based on prior studies. Electronically Signed   By: Franchot Gallo M.D.   On: 09/24/2017 14:47   Subjective: Feels at baseline, seems at baseline per wife. No fever, cough, shortness of breath, chills, abd pain, N/V/D. Walked in halls with PT who recommends SNF though patient's wife declines.   Discharge Exam: Vitals:   09/25/17 0525 09/25/17 1102  BP: (!) 156/103 (!) 180/93  Pulse: 67  Resp:    Temp:    SpO2: 100%    General: Pt is alert, awake, not in acute distress. Not oriented.  Cardiovascular: Regular, rate in 70's, S1/S2 +, no rubs, no gallops Respiratory: CTA bilaterally, no wheezing, no rhonchi Abdominal: Soft, NT, ND, bowel sounds + Extremities: No edema, no cyanosis  Labs: Basic Metabolic Panel: Recent Labs  Lab 09/24/17 1356 09/25/17 0620  NA 141 139  K 4.7 4.5  CL 111 110  CO2 21* 24  GLUCOSE 107* 84  BUN 32* 24*  CREATININE 1.81* 1.42*  CALCIUM 9.0 8.8*   Liver Function Tests: Recent Labs  Lab 09/24/17 1356  AST 22  ALT 16  ALKPHOS 73  BILITOT 0.8  PROT 5.7*  ALBUMIN 3.1*   Recent Labs  Lab 09/24/17 1356  LIPASE 30   CBC: Recent Labs   Lab 09/24/17 1356 09/25/17 0620  WBC 4.2 8.9  NEUTROABS 2.8  --   HGB 10.6* 10.9*  HCT 33.8* 33.2*  MCV 99.7 94.9  PLT 153 141*   Cardiac Enzymes: Recent Labs  Lab 09/24/17 2015  TROPONINI <0.03   Urinalysis    Component Value Date/Time   COLORURINE YELLOW 09/24/2017 1616   APPEARANCEUR CLEAR 09/24/2017 1616   LABSPEC 1.017 09/24/2017 1616   PHURINE 6.0 09/24/2017 1616   GLUCOSEU NEGATIVE 09/24/2017 1616   HGBUR NEGATIVE 09/24/2017 1616   BILIRUBINUR NEGATIVE 09/24/2017 1616   KETONESUR NEGATIVE 09/24/2017 1616   PROTEINUR NEGATIVE 09/24/2017 1616   UROBILINOGEN 0.2 01/06/2015 0927   NITRITE NEGATIVE 09/24/2017 1616   LEUKOCYTESUR NEGATIVE 09/24/2017 1616   Time coordinating discharge: Approximately 40 minutes  Patrecia Pour, MD  [facility] 09/25/2017, 2:44 PM Pager [phone]

## 2017-09-25 NOTE — Progress Notes (Signed)
CSW received consult regarding PT recommendation of SNF at discharge.  Patient's wife is refusing SNF and would like to take patient home. RNCM aware.  CSW signing off.   Percell Locus Nasri Boakye LCSW [phone]

## 2017-09-25 NOTE — Progress Notes (Signed)
Patient was discharged home with home health by MD order; discharged instructions review and give to patient and his wife with care notes; IV DIC; skin intact; patient will be escorted to the car by nurse tech via wheelchair.  °

## 2017-09-25 NOTE — Evaluation (Addendum)
Physical Therapy Evaluation Patient Details Name: Howard Padilla MRN: [id number] DOB: 01-24-1916 Today's Date: 09/25/2017   History of Present Illness  Patient is a 84 y/o male presenting to the ED on 09/24/17 with near syncope. PMH significant for HTN, HLD, DVT/PE, dementia, colon CA, and BPH.    Clinical Impression  Mr. Hackman is a pleasant 84 y/o male admitted with the above listed diagnosis. Patient's wife present for session. Patient today requiring overall Min A for mobility today for general safety. Patient slightly impulsive with forward lean in stance and dynamic mobility increasing his fall risk. Discussion with wife regarding benefit of short term rehab but with wife refusing this and requesting HHPT. PT to continue to follow to progress safe and independent functional mobility prior to d/c.    Follow Up Recommendations SNF;Supervision/Assistance - 24 hour(wife refusing - will likely need HHPT)    Equipment Recommendations  None recommended by PT    Recommendations for Other Services       Precautions / Restrictions Precautions Precautions: Fall Restrictions Weight Bearing Restrictions: No      Mobility  Bed Mobility Overal bed mobility: Needs Assistance Bed Mobility: Supine to Sit     Supine to sit: Min assist;Min guard        Transfers Overall transfer level: Needs assistance Equipment used: 1 person hand held assist Transfers: Sit to/from Stand Sit to Stand: Min guard         General transfer comment: slightly impulsive; min guard for safety  Ambulation/Gait Ambulation/Gait assistance: Min assist;Min guard Gait Distance (Feet): 80 Feet Assistive device: 1 person hand held assist Gait Pattern/deviations: Step-through pattern;Decreased stride length;Drifts right/left;Trunk flexed Gait velocity: decreased   General Gait Details: verbal cueing for posturing, safety, and obstacle navigation in hallway; slightly impulsive increasing his fall  risk  Stairs            Wheelchair Mobility    Modified Rankin (Stroke Patients Only)       Balance Overall balance assessment: Mild deficits observed, not formally tested                                           Pertinent Vitals/Pain Pain Assessment: No/denies pain    Home Living Family/patient expects to be discharged to:: Private residence Living Arrangements: Spouse/significant other Available Help at Discharge: Family;Available 24 hours/day Type of Home: House Home Access: Level entry     Home Layout: One level Home Equipment: Shower seat;Cane - single point Additional Comments: wife reports that she doesn't like himt o sue a cane to walk due to fear of falling    Prior Function Level of Independence: Needs assistance   Gait / Transfers Assistance Needed: SPC intermittently  ADL's / Homemaking Assistance Needed: patient wife and son assists with all ADL's        Hand Dominance   Dominant Hand: Right    Extremity/Trunk Assessment        Lower Extremity Assessment Lower Extremity Assessment: Generalized weakness    Cervical / Trunk Assessment Cervical / Trunk Assessment: Kyphotic  Communication   Communication: No difficulties  Cognition Arousal/Alertness: Awake/alert Behavior During Therapy: WFL for tasks assessed/performed Overall Cognitive Status: History of cognitive impairments - at baseline  General Comments      Exercises     Assessment/Plan    PT Assessment Patient needs continued PT services  PT Problem List Decreased strength;Decreased activity tolerance;Decreased balance;Decreased mobility;Decreased coordination;Decreased knowledge of use of DME;Decreased safety awareness       PT Treatment Interventions DME instruction;Gait training;Functional mobility training;Therapeutic activities;Therapeutic exercise;Balance training;Neuromuscular  re-education;Patient/family education    PT Goals (Current goals can be found in the Care Plan section)  Acute Rehab PT Goals Patient Stated Goal: wife wants patient to have HHPT PT Goal Formulation: With patient/family Time For Goal Achievement: 10/09/17 Potential to Achieve Goals: Fair    Frequency Min 3X/week   Barriers to discharge        Co-evaluation               AM-PAC PT "6 Clicks" Daily Activity  Outcome Measure Difficulty turning over in bed (including adjusting bedclothes, sheets and blankets)?: A Little Difficulty moving from lying on back to sitting on the side of the bed? : Unable Difficulty sitting down on and standing up from a chair with arms (e.g., wheelchair, bedside commode, etc,.)?: Unable Help needed moving to and from a bed to chair (including a wheelchair)?: A Little Help needed walking in hospital room?: A Little Help needed climbing 3-5 steps with a railing? : A Lot 6 Click Score: 13    End of Session Equipment Utilized During Treatment: Gait belt Activity Tolerance: Patient tolerated treatment well Patient left: in chair;with call bell/phone within reach;with chair alarm set;with family/visitor present Nurse Communication: Mobility status PT Visit Diagnosis: Unsteadiness on feet (R26.81);Other abnormalities of gait and mobility (R26.89);Muscle weakness (generalized) (M62.81)    Time: 0263-7858 PT Time Calculation (min) (ACUTE ONLY): 29 min   Charges:   PT Evaluation $PT Eval Moderate Complexity: 1 Mod PT Treatments $Gait Training: 8-22 mins        Lanney Gins, PT, DPT 09/25/17 2:57 PM Pager: [phone]

## 2017-09-26 ENCOUNTER — Telehealth: Payer: Self-pay | Admitting: Family Medicine

## 2017-09-26 NOTE — Telephone Encounter (Signed)
I tried to reach pt, no answer or option to leave a message, will try again later.

## 2017-09-27 ENCOUNTER — Telehealth: Payer: Self-pay | Admitting: Internal Medicine

## 2017-09-27 DIAGNOSIS — I441 Atrioventricular block, second degree: Secondary | ICD-10-CM | POA: Diagnosis not present

## 2017-09-27 DIAGNOSIS — N183 Chronic kidney disease, stage 3 (moderate): Secondary | ICD-10-CM | POA: Diagnosis not present

## 2017-09-27 DIAGNOSIS — Z7982 Long term (current) use of aspirin: Secondary | ICD-10-CM | POA: Diagnosis not present

## 2017-09-27 DIAGNOSIS — R55 Syncope and collapse: Secondary | ICD-10-CM | POA: Diagnosis not present

## 2017-09-27 DIAGNOSIS — Z8701 Personal history of pneumonia (recurrent): Secondary | ICD-10-CM | POA: Diagnosis not present

## 2017-09-27 DIAGNOSIS — I69320 Aphasia following cerebral infarction: Secondary | ICD-10-CM | POA: Diagnosis not present

## 2017-09-27 DIAGNOSIS — Z85038 Personal history of other malignant neoplasm of large intestine: Secondary | ICD-10-CM | POA: Diagnosis not present

## 2017-09-27 DIAGNOSIS — N400 Enlarged prostate without lower urinary tract symptoms: Secondary | ICD-10-CM | POA: Diagnosis not present

## 2017-09-27 DIAGNOSIS — D631 Anemia in chronic kidney disease: Secondary | ICD-10-CM | POA: Diagnosis not present

## 2017-09-27 DIAGNOSIS — F0390 Unspecified dementia without behavioral disturbance: Secondary | ICD-10-CM | POA: Diagnosis not present

## 2017-09-27 DIAGNOSIS — E785 Hyperlipidemia, unspecified: Secondary | ICD-10-CM | POA: Diagnosis not present

## 2017-09-27 DIAGNOSIS — Z86718 Personal history of other venous thrombosis and embolism: Secondary | ICD-10-CM | POA: Diagnosis not present

## 2017-09-27 DIAGNOSIS — I129 Hypertensive chronic kidney disease with stage 1 through stage 4 chronic kidney disease, or unspecified chronic kidney disease: Secondary | ICD-10-CM | POA: Diagnosis not present

## 2017-09-27 DIAGNOSIS — Z86711 Personal history of pulmonary embolism: Secondary | ICD-10-CM | POA: Diagnosis not present

## 2017-09-27 DIAGNOSIS — Z87891 Personal history of nicotine dependence: Secondary | ICD-10-CM | POA: Diagnosis not present

## 2017-09-27 NOTE — Telephone Encounter (Signed)
Okay for verbal orders? Please advise 

## 2017-09-27 NOTE — Telephone Encounter (Signed)
Referral to [facility] has been placed.

## 2017-09-27 NOTE — Telephone Encounter (Signed)
Howard Padilla called in regards to pt. Advised her she needs to be added to the [facility]. Requesting callback.

## 2017-09-27 NOTE — Telephone Encounter (Signed)
Verbal orders given to

## 2017-09-27 NOTE — Telephone Encounter (Signed)
Copied from [place] [id number]. Topic: Quick Communication - See Telephone Encounter >> Sep 27, 2017  2:47 PM Synthia Innocent wrote: CRM for notification. See Telephone encounter for: 09/27/17. [facility] calling requesting verbal PT orders, 2x a week for 4 weeks

## 2017-09-27 NOTE — Telephone Encounter (Signed)
I was unable to complete the TCM call, patient did not feel up to answering questions, wife is not on the DPR, she will take care of this when they come in for office visit. Patient did not schedule at this time, will call back.

## 2017-09-27 NOTE — Telephone Encounter (Signed)
I left a voice message for pt to return my call.  

## 2017-09-28 NOTE — Telephone Encounter (Signed)
Verbal orders given to [facility].

## 2017-09-28 NOTE — Telephone Encounter (Signed)
Okay for verbal orders. 

## 2017-10-03 DIAGNOSIS — I441 Atrioventricular block, second degree: Secondary | ICD-10-CM | POA: Diagnosis not present

## 2017-10-03 DIAGNOSIS — N183 Chronic kidney disease, stage 3 (moderate): Secondary | ICD-10-CM | POA: Diagnosis not present

## 2017-10-03 DIAGNOSIS — F0390 Unspecified dementia without behavioral disturbance: Secondary | ICD-10-CM | POA: Diagnosis not present

## 2017-10-03 DIAGNOSIS — I129 Hypertensive chronic kidney disease with stage 1 through stage 4 chronic kidney disease, or unspecified chronic kidney disease: Secondary | ICD-10-CM | POA: Diagnosis not present

## 2017-10-03 DIAGNOSIS — R55 Syncope and collapse: Secondary | ICD-10-CM | POA: Diagnosis not present

## 2017-10-03 DIAGNOSIS — D631 Anemia in chronic kidney disease: Secondary | ICD-10-CM | POA: Diagnosis not present

## 2017-10-04 NOTE — Telephone Encounter (Signed)
Merrilee Seashore from [facility] called and stated that pt canceled visit for 10/05/17. Please note [phone]

## 2017-10-04 NOTE — Telephone Encounter (Signed)
Noted  

## 2017-10-11 DIAGNOSIS — F0390 Unspecified dementia without behavioral disturbance: Secondary | ICD-10-CM | POA: Diagnosis not present

## 2017-10-11 DIAGNOSIS — R55 Syncope and collapse: Secondary | ICD-10-CM | POA: Diagnosis not present

## 2017-10-11 DIAGNOSIS — I129 Hypertensive chronic kidney disease with stage 1 through stage 4 chronic kidney disease, or unspecified chronic kidney disease: Secondary | ICD-10-CM | POA: Diagnosis not present

## 2017-10-11 DIAGNOSIS — I441 Atrioventricular block, second degree: Secondary | ICD-10-CM | POA: Diagnosis not present

## 2017-10-11 DIAGNOSIS — N183 Chronic kidney disease, stage 3 (moderate): Secondary | ICD-10-CM | POA: Diagnosis not present

## 2017-10-11 DIAGNOSIS — D631 Anemia in chronic kidney disease: Secondary | ICD-10-CM | POA: Diagnosis not present

## 2017-10-17 DIAGNOSIS — I441 Atrioventricular block, second degree: Secondary | ICD-10-CM | POA: Diagnosis not present

## 2017-10-17 DIAGNOSIS — N183 Chronic kidney disease, stage 3 (moderate): Secondary | ICD-10-CM | POA: Diagnosis not present

## 2017-10-17 DIAGNOSIS — D631 Anemia in chronic kidney disease: Secondary | ICD-10-CM | POA: Diagnosis not present

## 2017-10-17 DIAGNOSIS — F0390 Unspecified dementia without behavioral disturbance: Secondary | ICD-10-CM | POA: Diagnosis not present

## 2017-10-17 DIAGNOSIS — R55 Syncope and collapse: Secondary | ICD-10-CM | POA: Diagnosis not present

## 2017-10-17 DIAGNOSIS — I129 Hypertensive chronic kidney disease with stage 1 through stage 4 chronic kidney disease, or unspecified chronic kidney disease: Secondary | ICD-10-CM | POA: Diagnosis not present

## 2017-10-19 DIAGNOSIS — R55 Syncope and collapse: Secondary | ICD-10-CM | POA: Diagnosis not present

## 2017-10-19 DIAGNOSIS — F0390 Unspecified dementia without behavioral disturbance: Secondary | ICD-10-CM | POA: Diagnosis not present

## 2017-10-19 DIAGNOSIS — I129 Hypertensive chronic kidney disease with stage 1 through stage 4 chronic kidney disease, or unspecified chronic kidney disease: Secondary | ICD-10-CM | POA: Diagnosis not present

## 2017-10-19 DIAGNOSIS — D631 Anemia in chronic kidney disease: Secondary | ICD-10-CM | POA: Diagnosis not present

## 2017-10-19 DIAGNOSIS — I441 Atrioventricular block, second degree: Secondary | ICD-10-CM | POA: Diagnosis not present

## 2017-10-19 DIAGNOSIS — N183 Chronic kidney disease, stage 3 (moderate): Secondary | ICD-10-CM | POA: Diagnosis not present

## 2017-10-24 DIAGNOSIS — I441 Atrioventricular block, second degree: Secondary | ICD-10-CM | POA: Diagnosis not present

## 2017-10-24 DIAGNOSIS — I129 Hypertensive chronic kidney disease with stage 1 through stage 4 chronic kidney disease, or unspecified chronic kidney disease: Secondary | ICD-10-CM | POA: Diagnosis not present

## 2017-10-24 DIAGNOSIS — F0390 Unspecified dementia without behavioral disturbance: Secondary | ICD-10-CM | POA: Diagnosis not present

## 2017-10-24 DIAGNOSIS — N183 Chronic kidney disease, stage 3 (moderate): Secondary | ICD-10-CM | POA: Diagnosis not present

## 2017-10-24 DIAGNOSIS — D631 Anemia in chronic kidney disease: Secondary | ICD-10-CM | POA: Diagnosis not present

## 2017-10-24 DIAGNOSIS — R55 Syncope and collapse: Secondary | ICD-10-CM | POA: Diagnosis not present

## 2017-11-28 DIAGNOSIS — Z23 Encounter for immunization: Secondary | ICD-10-CM | POA: Diagnosis not present

## 2018-02-07 ENCOUNTER — Other Ambulatory Visit: Payer: Self-pay | Admitting: *Deleted

## 2018-02-07 MED ORDER — DOXAZOSIN MESYLATE 4 MG PO TABS
2.0000 mg | ORAL_TABLET | Freq: Every day | ORAL | 0 refills | Status: DC
Start: 2018-02-07 — End: 2018-08-15

## 2018-08-15 ENCOUNTER — Ambulatory Visit (INDEPENDENT_AMBULATORY_CARE_PROVIDER_SITE_OTHER): Payer: Medicare Other | Admitting: Internal Medicine

## 2018-08-15 DIAGNOSIS — Z85038 Personal history of other malignant neoplasm of large intestine: Secondary | ICD-10-CM

## 2018-08-15 DIAGNOSIS — N400 Enlarged prostate without lower urinary tract symptoms: Secondary | ICD-10-CM | POA: Diagnosis not present

## 2018-08-15 DIAGNOSIS — E785 Hyperlipidemia, unspecified: Secondary | ICD-10-CM

## 2018-08-15 DIAGNOSIS — I10 Essential (primary) hypertension: Secondary | ICD-10-CM | POA: Diagnosis not present

## 2018-08-15 MED ORDER — AMLODIPINE BESYLATE 5 MG PO TABS: 5.0000 mg | ORAL_TABLET | Freq: Every day | ORAL | 1 refills | Status: DC

## 2018-08-15 MED ORDER — DOXAZOSIN MESYLATE 4 MG PO TABS: 2.0000 mg | ORAL_TABLET | Freq: Every day | ORAL | 1 refills | Status: DC

## 2018-08-15 NOTE — Progress Notes (Addendum)
Virtual Visit via Video Note  I connected with Howard Padilla on 08/15/18 at  3:30 PM EDT by a video enabled telemedicine application and verified that I am speaking with the correct person using two identifiers.  Location patient: home Location provider: work office Persons participating in the virtual visit: patient, provider wife who assists with technology  I discussed the limitations of evaluation and management by telemedicine and the availability of in person appointments. The patient expressed understanding and agreed to proceed.   HPI: This is a scheduled visit to establish care.  Howard Padilla is a remarkable 85 year old gentleman who has a history of well-controlled hypertension, BPH with minimal symptoms.  He has had TIAs and strokes in the past.  They have scheduled this visit mainly for medication refills.  He has no remarkable health for his advanced age, he has no issues with hearing, is independent with most ADLs still, walks 15 minutes a day for exercise.   ROS: Constitutional: Denies fever, chills, diaphoresis, appetite change and fatigue.  HEENT: Denies photophobia, eye pain, redness, hearing loss, ear pain, congestion, sore throat, rhinorrhea, sneezing, mouth sores, trouble swallowing, neck pain, neck stiffness and tinnitus.   Respiratory: Denies SOB, DOE, cough, chest tightness,  and wheezing.   Cardiovascular: Denies chest pain, palpitations and leg swelling.  Gastrointestinal: Denies nausea, vomiting, abdominal pain, diarrhea, constipation, blood in stool and abdominal distention.  Genitourinary: Denies dysuria, urgency, frequency, hematuria, flank pain and difficulty urinating.  Endocrine: Denies: hot or cold intolerance, sweats, changes in hair or nails, polyuria, polydipsia. Musculoskeletal: Denies myalgias, back pain, joint swelling, arthralgias and gait problem.  Skin: Denies pallor, rash and wound.  Neurological: Denies dizziness, seizures, syncope,  weakness, light-headedness, numbness and headaches.  Hematological: Denies adenopathy. Easy bruising, personal or family bleeding history  Psychiatric/Behavioral: Denies suicidal ideation, mood changes, confusion, nervousness, sleep disturbance and agitation   Past Medical History:  Diagnosis Date  . BPH (benign prostatic hypertrophy)   . Cancer ([place])    colon  . Dementia   . DVT (deep venous thrombosis) ([place])    with pulmonary embo.  Mar[place] Kitchen Hyperlipidemia   . Hypertension     Past Surgical History:  Procedure Laterality Date  . COLON SURGERY     partial colectomy  . HERNIA REPAIR     ingunial    Family History  Family history unknown: Yes    SOCIAL HX:   reports that he quit smoking about 35 years ago. His smoking use included cigarettes. He has a 60.00 pack-year smoking history. He has never used smokeless tobacco. He reports that he does not drink alcohol or use drugs.   Current Outpatient Medications:  .  acetaminophen (TYLENOL) 500 MG tablet, Take 2 tablets (1,000 mg total) by mouth every 6 (six) hours as needed. (Patient taking differently: Take 1,000 mg by mouth every 6 (six) hours as needed for headache (pain). ), Disp: 30 tablet, Rfl: 0 .  amLODipine (NORVASC) 5 MG tablet, Take 1 tablet (5 mg total) by mouth daily., Disp: 90 tablet, Rfl: 1 .  aspirin 81 MG chewable tablet, Chew 1 tablet (81 mg total) by mouth daily. With Breakfast, Disp: 30 tablet, Rfl: 1 .  doxazosin (CARDURA) 4 MG tablet, Take 0.5 tablets (2 mg total) by mouth daily. Needs to establish with new provider for further refills, Disp: 90 tablet, Rfl: 1 .  Multiple Vitamin (MULTIVITAMIN WITH MINERALS) TABS tablet, Take 1 tablet by mouth daily., Disp: , Rfl:  .  ondansetron (ZOFRAN  ODT) 4 MG disintegrating tablet, Take 1 tablet (4 mg total) by mouth every 8 (eight) hours as needed for nausea or vomiting., Disp: 20 tablet, Rfl: 0 .  vitamin C (ASCORBIC ACID) 500 MG tablet, Take 500 mg by mouth daily., Disp: ,  Rfl:   EXAM:   VITALS per patient if applicable: None reported  GENERAL: alert, oriented, appears well and in no acute distress  HEENT: atraumatic, conjunttiva clear, no obvious abnormalities on inspection of external nose and ears, adentulous  NECK: normal movements of the head and neck  LUNGS: on inspection no signs of respiratory distress, breathing rate appears normal, no obvious gross increased work of breathing, gasping or wheezing  CV: no obvious cyanosis  MS: moves all visible extremities without noticeable abnormality  PSYCH/NEURO: pleasant and cooperative, no obvious depression or anxiety, speech and thought processing grossly intact  ASSESSMENT AND PLAN:   Hyperlipidemia, unspecified hyperlipidemia type -Last LDL was 136 in June 2019, not on medications.  Essential hypertension  -Has been well controlled in past, continue Norvasc  History of malignant neoplasm of large intestine -Remote, noted  Benign prostatic hyperplasia without lower urinary tract symptoms - -minimal, if any symptoms, refill doxazosin.   Patient has trouble breathing at night when head is elevated less than 30 degrees. Bed wedges do not provide enough elevation to resolve breathing issues. Shortness of breath causes the patient to require immediate changes in body position which cannot be achieved with a normal bed.  Because of this hospital bed has been prescribed.    I discussed the assessment and treatment plan with the patient. The patient was provided an opportunity to ask questions and all were answered. The patient agreed with the plan and demonstrated an understanding of the instructions.   The patient was advised to call back or seek an in-person evaluation if the symptoms worsen or if the condition fails to improve as anticipated.    Lelon Frohlich, MD  [facility]

## 2018-08-19 ENCOUNTER — Telehealth: Payer: Self-pay | Admitting: Internal Medicine

## 2018-08-19 DIAGNOSIS — N400 Enlarged prostate without lower urinary tract symptoms: Secondary | ICD-10-CM

## 2018-08-19 NOTE — Telephone Encounter (Signed)
amLODipine (NORVASC) 5 MG tablet  doxazosin (CARDURA) 4 MG tablet  [facility] [place] [id number]  1 [phone]  Please reroute these 2 scripts to this [facility] instead

## 2018-08-20 MED ORDER — DOXAZOSIN MESYLATE 4 MG PO TABS: 2.0000 mg | ORAL_TABLET | Freq: Every day | ORAL | 1 refills | Status: DC

## 2018-09-23 ENCOUNTER — Other Ambulatory Visit: Payer: Self-pay | Admitting: Internal Medicine

## 2018-09-30 ENCOUNTER — Other Ambulatory Visit: Payer: Self-pay

## 2018-11-12 ENCOUNTER — Telehealth: Payer: Self-pay | Admitting: *Deleted

## 2018-11-12 NOTE — Telephone Encounter (Signed)
Copied from [place] [id number]. Topic: Appointment Scheduling - Scheduling Inquiry for Clinic >> Nov 12, 2018  1:29 PM Burchel, Abbi R wrote: Pt's wife called to ask if pt should come into the office for flu shot.  Please advise.   [phone]

## 2018-11-13 NOTE — Telephone Encounter (Signed)
Left detailed message on machine for patient to call and schedule a flu vaccine.

## 2019-01-21 ENCOUNTER — Other Ambulatory Visit: Payer: Self-pay

## 2019-04-07 ENCOUNTER — Other Ambulatory Visit: Payer: Self-pay | Admitting: Internal Medicine

## 2019-04-10 ENCOUNTER — Telehealth: Payer: Self-pay | Admitting: Internal Medicine

## 2019-04-10 NOTE — Telephone Encounter (Signed)
Left message with male that if Howard Padilla has the form then she can drop it off.  If she does not have the form then we will fill one out for her.

## 2019-04-10 NOTE — Telephone Encounter (Signed)
Pt wife would like a call so that you can explain how she gets handicap form filled out.   Patient [phone]

## 2019-04-14 ENCOUNTER — Telehealth: Payer: Self-pay | Admitting: Internal Medicine

## 2019-04-14 NOTE — Telephone Encounter (Signed)
Pt wife wants to know how pt can get covid vaccine since he is home bound. Would a nurse come to his house to give etc..?  Patient Phone:[phone]

## 2019-04-15 NOTE — Telephone Encounter (Signed)
Spoke with wife and will try HH to see if they can help.

## 2019-04-18 NOTE — Telephone Encounter (Signed)
Spoke with wife and the best option now is his pharmacy.

## 2019-06-24 ENCOUNTER — Telehealth: Payer: Self-pay | Admitting: Internal Medicine

## 2019-06-24 ENCOUNTER — Telehealth: Payer: Self-pay | Admitting: *Deleted

## 2019-06-24 DIAGNOSIS — Z85038 Personal history of other malignant neoplasm of large intestine: Secondary | ICD-10-CM

## 2019-06-24 DIAGNOSIS — R4701 Aphasia: Secondary | ICD-10-CM

## 2019-06-24 DIAGNOSIS — G459 Transient cerebral ischemic attack, unspecified: Secondary | ICD-10-CM

## 2019-06-24 MED ORDER — ENSURE PO LIQD: 237.0000 mL | Freq: Two times a day (BID) | ORAL | 12 refills | Status: DC

## 2019-06-24 NOTE — Telephone Encounter (Signed)
Granddaughter called to inform us that that the patient fell last night.  Patient is complaining of pain and swelling with his right leg.  Advised patient to go to the ED.

## 2019-06-24 NOTE — Telephone Encounter (Signed)
Rx and order placed.  Wife reminded again of Dr Ledell Noss recommendation. Wife declined.

## 2019-06-24 NOTE — Telephone Encounter (Signed)
Agree with ED visit to r/o fractures

## 2019-06-24 NOTE — Telephone Encounter (Signed)
I am ok with hospital bed and ensure. Still recommend ED eval as he may have a fracture following his fall.

## 2019-06-24 NOTE — Telephone Encounter (Signed)
Pt wife Howard Padilla called requesting to speak with [name]. Informed I could put in message to have her give her a call back. She would not provide any information about what she was needing, just has several questions about her husband.

## 2019-06-24 NOTE — Addendum Note (Signed)
Addended by: Westley Hummer B on: 06/24/2019 04:25 PM   Modules accepted: Orders

## 2019-06-24 NOTE — Telephone Encounter (Signed)
Pt wants to know if Dr. Dana Allan can put in an order for a mobile X-ray to have it done somewhere else. The patient granddaughter said she spoke with [name] about this and understood that we did not have a portable x-ray in our office. She doesn't feel its safe to move the patient around .  please  call ciarra jones [phone]

## 2019-06-24 NOTE — Telephone Encounter (Signed)
Advised granddaughter to take patient to ED or call 911.  See phone note.

## 2019-06-24 NOTE — Telephone Encounter (Signed)
Wife is calling for a hospital bed with rails and Rx for Ensure.  Okay to order?  Wife states she" put ice on his knee and swelling went down".  She declines the recommendation to to go to the ED due to [place].  [place]

## 2019-07-21 ENCOUNTER — Telehealth: Payer: Self-pay | Admitting: Internal Medicine

## 2019-07-21 DIAGNOSIS — Z9181 History of falling: Secondary | ICD-10-CM

## 2019-07-21 DIAGNOSIS — R29898 Other symptoms and signs involving the musculoskeletal system: Secondary | ICD-10-CM

## 2019-07-21 NOTE — Telephone Encounter (Signed)
[name], pt's spouse (no DPR on file), states he fell 2 1/2 weeks ago and some days he cannot walk very good such as from the bedroom to the kitchen he struggles . She is wondering if he can do PT?   Pt  can be reached at [phone]

## 2019-07-22 NOTE — Telephone Encounter (Signed)
I am ok with PT, but he could he have suffered a fx? May need xrays. Maybe we can do a VV

## 2019-07-22 NOTE — Addendum Note (Signed)
Addended by: Westley Hummer B on: 07/22/2019 11:09 AM   Modules accepted: Orders

## 2019-07-22 NOTE — Telephone Encounter (Signed)
Spoke with wife and states when she called 911 the EMS workers moved his legs with no problem, and the patient can walk to eat at the table with no concerns.  Wife is worried about his strength. Virtual visit declined at this time.  PT order placed

## 2019-07-23 ENCOUNTER — Telehealth: Payer: Self-pay | Admitting: Internal Medicine

## 2019-07-23 NOTE — Telephone Encounter (Signed)
A mychart appointment has been schedule.  Howard Padilla will go to the patient's home for the visit to help.

## 2019-07-23 NOTE — Telephone Encounter (Signed)
Candi Leash with [facility] is calling about a face to face since the pt has not been seen since 2020

## 2019-07-23 NOTE — Telephone Encounter (Signed)
Not quite sure what this means...they want him to be seen in office?

## 2019-07-29 ENCOUNTER — Telehealth: Payer: Self-pay | Admitting: Internal Medicine

## 2019-07-29 NOTE — Telephone Encounter (Signed)
Hoyle Sauer with [facility] would like [name] to call her on her cell phone [phone] in regards to pt [name].

## 2019-07-29 NOTE — Telephone Encounter (Signed)
Spoke with Hoyle Sauer and will call back before the appointment.

## 2019-07-29 NOTE — Telephone Encounter (Signed)
Called and confirmed with Hoyle Sauer.

## 2019-07-29 NOTE — Telephone Encounter (Signed)
Ms. Howard Padilla is calling in stating that she has a new number and it is [phone] and would like to be called to let her know if the persons is still going to be coming out to the house at 2:30?

## 2019-07-31 ENCOUNTER — Telehealth: Payer: Self-pay | Admitting: Internal Medicine

## 2019-07-31 ENCOUNTER — Telehealth (INDEPENDENT_AMBULATORY_CARE_PROVIDER_SITE_OTHER): Payer: Medicare Other | Admitting: Internal Medicine

## 2019-07-31 DIAGNOSIS — I10 Essential (primary) hypertension: Secondary | ICD-10-CM

## 2019-07-31 DIAGNOSIS — N400 Enlarged prostate without lower urinary tract symptoms: Secondary | ICD-10-CM

## 2019-07-31 DIAGNOSIS — D631 Anemia in chronic kidney disease: Secondary | ICD-10-CM | POA: Diagnosis not present

## 2019-07-31 DIAGNOSIS — R531 Weakness: Secondary | ICD-10-CM

## 2019-07-31 DIAGNOSIS — I129 Hypertensive chronic kidney disease with stage 1 through stage 4 chronic kidney disease, or unspecified chronic kidney disease: Secondary | ICD-10-CM | POA: Diagnosis not present

## 2019-07-31 DIAGNOSIS — R29898 Other symptoms and signs involving the musculoskeletal system: Secondary | ICD-10-CM | POA: Diagnosis not present

## 2019-07-31 DIAGNOSIS — Z85038 Personal history of other malignant neoplasm of large intestine: Secondary | ICD-10-CM | POA: Diagnosis not present

## 2019-07-31 DIAGNOSIS — Z8673 Personal history of transient ischemic attack (TIA), and cerebral infarction without residual deficits: Secondary | ICD-10-CM | POA: Diagnosis not present

## 2019-07-31 DIAGNOSIS — E785 Hyperlipidemia, unspecified: Secondary | ICD-10-CM | POA: Diagnosis not present

## 2019-07-31 DIAGNOSIS — F0390 Unspecified dementia without behavioral disturbance: Secondary | ICD-10-CM | POA: Diagnosis not present

## 2019-07-31 DIAGNOSIS — N183 Chronic kidney disease, stage 3 unspecified: Secondary | ICD-10-CM | POA: Diagnosis not present

## 2019-07-31 DIAGNOSIS — R0602 Shortness of breath: Secondary | ICD-10-CM | POA: Diagnosis not present

## 2019-07-31 DIAGNOSIS — Z9181 History of falling: Secondary | ICD-10-CM | POA: Diagnosis not present

## 2019-07-31 NOTE — Telephone Encounter (Signed)
Left message on machine for PT to return our call.

## 2019-07-31 NOTE — Telephone Encounter (Signed)
Charis with [facility], is requesting a verbal order for home care physical therapy. 1 week 1, 2 week 2, and 1 week 2. Thanks   Charis with [facility] [phone]

## 2019-07-31 NOTE — Progress Notes (Signed)
Virtual Visit via Video Note  I connected with Howard Padilla on 07/31/19 at  2:00 PM EDT by a video enabled telemedicine application and verified that I am speaking with the correct person using two identifiers.  Location patient: home Location provider: work office Persons participating in the virtual visit: patient, provider, wife, home health physical therapist  I discussed the limitations of evaluation and management by telemedicine and the availability of in person appointments. The patient expressed understanding and agreed to proceed.   HPI: This is a yearly follow-up with Howard Padilla.  He is a remarkable 86 year old male with past medical history significant for hypertension and BPH.  He also has a prior history of TIAs.  His medications include aspirin 81 mg daily, Norvasc 5 mg daily and doxazosin 4 mg at bedtime.  His appetite is good.  He had a fall at home a few months back out of bed and ever since then has been having some generalized weakness and difficulty walking.  Physical therapy has been involved.  He cannot localize any particular area of pain.  Wife states he is completely dependent on ADLs, he can watch and understand TV, no longer reads due to visual difficulties, he is able to eat by himself.  He has had no updated blood work since 2019.  He has not yet received his Covid vaccines as he does not leave the house.   ROS: Constitutional: Denies fever, chills, diaphoresis, appetite change and fatigue.  HEENT: Denies photophobia, eye pain, redness, hearing loss, ear pain, congestion, sore throat, rhinorrhea, sneezing, mouth sores, trouble swallowing, neck pain, neck stiffness and tinnitus.   Respiratory: Denies SOB, DOE, cough, chest tightness,  and wheezing.   Cardiovascular: Denies chest pain, palpitations and leg swelling.  Gastrointestinal: Denies nausea, vomiting, abdominal pain, diarrhea, constipation, blood in stool and abdominal distention.  Genitourinary:  Denies dysuria, urgency, frequency, hematuria, flank pain and difficulty urinating.  Endocrine: Denies: hot or cold intolerance, sweats, changes in hair or nails, polyuria, polydipsia. Musculoskeletal: Denies myalgias, back pain, joint swelling, arthralgias and gait problem.  Skin: Denies pallor, rash and wound.  Neurological: Denies dizziness, seizures, syncope, weakness, light-headedness, numbness and headaches.  Hematological: Denies adenopathy. Easy bruising, personal or family bleeding history  Psychiatric/Behavioral: Denies suicidal ideation, mood changes, confusion, nervousness, sleep disturbance and agitation   Past Medical History:  Diagnosis Date  . BPH (benign prostatic hypertrophy)   . Cancer ([place])    colon  . Dementia   . DVT (deep venous thrombosis) ([place])    with pulmonary embo.  Marland Kitchen Hyperlipidemia   . Hypertension     Past Surgical History:  Procedure Laterality Date  . COLON SURGERY     partial colectomy  . HERNIA REPAIR     ingunial    Family History  Family history unknown: Yes    SOCIAL HX:   reports that he quit smoking about 36 years ago. His smoking use included cigarettes. He has a 60.00 pack-year smoking history. He has never used smokeless tobacco. He reports that he does not drink alcohol or use drugs.   Current Outpatient Medications:  .  acetaminophen (TYLENOL) 500 MG tablet, Take 2 tablets (1,000 mg total) by mouth every 6 (six) hours as needed. (Patient taking differently: Take 1,000 mg by mouth every 6 (six) hours as needed for headache (pain). ), Disp: 30 tablet, Rfl: 0 .  amLODipine (NORVASC) 10 MG tablet, TAKE 1 TABLET BY MOUTH EVERY DAY FOR BLOOD PRESSURE, Disp: 90 tablet,  Rfl: 1 .  amLODipine (NORVASC) 5 MG tablet, Take 1 tablet (5 mg total) by mouth daily., Disp: 90 tablet, Rfl: 1 .  aspirin 81 MG chewable tablet, Chew 1 tablet (81 mg total) by mouth daily. With Breakfast, Disp: 30 tablet, Rfl: 1 .  doxazosin (CARDURA) 4 MG tablet, Take 0.5  tablets (2 mg total) by mouth daily., Disp: 90 tablet, Rfl: 1 .  Ensure (ENSURE), Take 237 mLs by mouth 2 (two) times daily between meals., Disp: 237 mL, Rfl: 12 .  Multiple Vitamin (MULTIVITAMIN WITH MINERALS) TABS tablet, Take 1 tablet by mouth daily., Disp: , Rfl:  .  ondansetron (ZOFRAN ODT) 4 MG disintegrating tablet, Take 1 tablet (4 mg total) by mouth every 8 (eight) hours as needed for nausea or vomiting., Disp: 20 tablet, Rfl: 0 .  vitamin C (ASCORBIC ACID) 500 MG tablet, Take 500 mg by mouth daily., Disp: , Rfl:   EXAM:   VITALS per patient if applicable: Blood pressure 100/60, heart rate 77, temperature 97.6 and O2 saturations 90  GENERAL: alert, oriented, appears well and in no acute distress  HEENT: atraumatic, conjunttiva clear, no obvious abnormalities on inspection of external nose and ears, wears corrective lenses, appears hard of hearing  NECK: normal movements of the head and neck  LUNGS: on inspection no signs of respiratory distress, breathing rate appears normal, no obvious gross increased work of breathing, gasping or wheezing  CV: no obvious cyanosis  MS: moves all visible extremities without noticeable abnormality  PSYCH/NEURO: pleasant and cooperative, no obvious depression or anxiety, speech and thought processing grossly intact  ASSESSMENT AND PLAN:   Hyperlipidemia, unspecified hyperlipidemia type -Check lipid panel, he is not on a statin  Essential hypertension -Blood pressures well controlled on current regimen  Benign prostatic hyperplasia without lower urinary tract symptoms -He remains incontinent but has no difficulty urinating, is on doxazosin  Weakness generalized -Home health PT is already on board. -Will request blood work to see if there is any organic reason as to why he could be weaker than usual.  I will have home health agency draw this.  I will inquire if the home health agency has the facility to administer Covid vaccinations and  if so we will order for him.     I discussed the assessment and treatment plan with the patient. The patient was provided an opportunity to ask questions and all were answered. The patient agreed with the plan and demonstrated an understanding of the instructions.   The patient was advised to call back or seek an in-person evaluation if the symptoms worsen or if the condition fails to improve as anticipated.    Lelon Frohlich, MD  [facility]

## 2019-08-01 ENCOUNTER — Other Ambulatory Visit: Payer: Self-pay | Admitting: Internal Medicine

## 2019-08-01 DIAGNOSIS — N189 Chronic kidney disease, unspecified: Secondary | ICD-10-CM

## 2019-08-01 DIAGNOSIS — R29898 Other symptoms and signs involving the musculoskeletal system: Secondary | ICD-10-CM

## 2019-08-01 DIAGNOSIS — R531 Weakness: Secondary | ICD-10-CM

## 2019-08-01 DIAGNOSIS — N183 Chronic kidney disease, stage 3 unspecified: Secondary | ICD-10-CM

## 2019-08-01 DIAGNOSIS — Z9181 History of falling: Secondary | ICD-10-CM

## 2019-08-01 DIAGNOSIS — E785 Hyperlipidemia, unspecified: Secondary | ICD-10-CM

## 2019-08-01 DIAGNOSIS — I10 Essential (primary) hypertension: Secondary | ICD-10-CM

## 2019-08-01 DIAGNOSIS — D631 Anemia in chronic kidney disease: Secondary | ICD-10-CM

## 2019-08-01 NOTE — Telephone Encounter (Signed)
Verbal orders given to [facility] for PT

## 2019-08-01 NOTE — Telephone Encounter (Signed)
Charis returning call please call again [phone]

## 2019-08-01 NOTE — Telephone Encounter (Signed)
Left message on machine for [facility] to return our call.

## 2019-08-06 DIAGNOSIS — N183 Chronic kidney disease, stage 3 unspecified: Secondary | ICD-10-CM | POA: Diagnosis not present

## 2019-08-06 DIAGNOSIS — I129 Hypertensive chronic kidney disease with stage 1 through stage 4 chronic kidney disease, or unspecified chronic kidney disease: Secondary | ICD-10-CM | POA: Diagnosis not present

## 2019-08-06 DIAGNOSIS — F0390 Unspecified dementia without behavioral disturbance: Secondary | ICD-10-CM | POA: Diagnosis not present

## 2019-08-06 DIAGNOSIS — E785 Hyperlipidemia, unspecified: Secondary | ICD-10-CM | POA: Diagnosis not present

## 2019-08-06 DIAGNOSIS — D631 Anemia in chronic kidney disease: Secondary | ICD-10-CM | POA: Diagnosis not present

## 2019-08-06 DIAGNOSIS — N400 Enlarged prostate without lower urinary tract symptoms: Secondary | ICD-10-CM | POA: Diagnosis not present

## 2019-08-08 DIAGNOSIS — D631 Anemia in chronic kidney disease: Secondary | ICD-10-CM | POA: Diagnosis not present

## 2019-08-08 DIAGNOSIS — N183 Chronic kidney disease, stage 3 unspecified: Secondary | ICD-10-CM | POA: Diagnosis not present

## 2019-08-08 DIAGNOSIS — I129 Hypertensive chronic kidney disease with stage 1 through stage 4 chronic kidney disease, or unspecified chronic kidney disease: Secondary | ICD-10-CM | POA: Diagnosis not present

## 2019-08-08 DIAGNOSIS — F0390 Unspecified dementia without behavioral disturbance: Secondary | ICD-10-CM | POA: Diagnosis not present

## 2019-08-08 DIAGNOSIS — N400 Enlarged prostate without lower urinary tract symptoms: Secondary | ICD-10-CM | POA: Diagnosis not present

## 2019-08-08 DIAGNOSIS — E785 Hyperlipidemia, unspecified: Secondary | ICD-10-CM | POA: Diagnosis not present

## 2019-08-11 DIAGNOSIS — I129 Hypertensive chronic kidney disease with stage 1 through stage 4 chronic kidney disease, or unspecified chronic kidney disease: Secondary | ICD-10-CM | POA: Diagnosis not present

## 2019-08-11 DIAGNOSIS — F0390 Unspecified dementia without behavioral disturbance: Secondary | ICD-10-CM | POA: Diagnosis not present

## 2019-08-11 DIAGNOSIS — N400 Enlarged prostate without lower urinary tract symptoms: Secondary | ICD-10-CM | POA: Diagnosis not present

## 2019-08-11 DIAGNOSIS — N183 Chronic kidney disease, stage 3 unspecified: Secondary | ICD-10-CM | POA: Diagnosis not present

## 2019-08-11 DIAGNOSIS — E785 Hyperlipidemia, unspecified: Secondary | ICD-10-CM | POA: Diagnosis not present

## 2019-08-11 DIAGNOSIS — D631 Anemia in chronic kidney disease: Secondary | ICD-10-CM | POA: Diagnosis not present

## 2019-08-12 NOTE — Telephone Encounter (Signed)
FYI

## 2019-08-12 NOTE — Telephone Encounter (Signed)
Pt's spouse, [name], stated that a home health nurse, Manus Rudd. Wilder Glade, came to her house at 6:30pm to do the blood work for the labs Dr. Jerilee Hoh. [name] did not know who she was and did not know that Jerilee Hoh wanted blood work done on the pt so she declined it being done and that she would contact the office today. She stated that she does not like them coming past 5pm. The acceptable hours is between 8am-5pm. I informed pt to contact the home health to notify them of the acceptable hours. [name] stated that she did notify them and wanted to make PCP aware.   [name] can be reached at [phone] if needed.

## 2019-08-13 DIAGNOSIS — E785 Hyperlipidemia, unspecified: Secondary | ICD-10-CM | POA: Diagnosis not present

## 2019-08-13 DIAGNOSIS — I129 Hypertensive chronic kidney disease with stage 1 through stage 4 chronic kidney disease, or unspecified chronic kidney disease: Secondary | ICD-10-CM | POA: Diagnosis not present

## 2019-08-13 DIAGNOSIS — N400 Enlarged prostate without lower urinary tract symptoms: Secondary | ICD-10-CM | POA: Diagnosis not present

## 2019-08-13 DIAGNOSIS — D631 Anemia in chronic kidney disease: Secondary | ICD-10-CM | POA: Diagnosis not present

## 2019-08-13 DIAGNOSIS — F0390 Unspecified dementia without behavioral disturbance: Secondary | ICD-10-CM | POA: Diagnosis not present

## 2019-08-13 DIAGNOSIS — N183 Chronic kidney disease, stage 3 unspecified: Secondary | ICD-10-CM | POA: Diagnosis not present

## 2019-08-14 ENCOUNTER — Telehealth: Payer: Self-pay | Admitting: Internal Medicine

## 2019-08-14 NOTE — Telephone Encounter (Signed)
Patient is refusing to let home health in for lab work.

## 2019-08-20 NOTE — Telephone Encounter (Signed)
That is their decision to make; unfortunately I am limited in what I can do if they will not allow labs to be done.

## 2019-08-22 NOTE — Telephone Encounter (Signed)
Attempted to e-mail for a Covid vaccine but was unsuccessful. Left message on machine

## 2019-08-26 ENCOUNTER — Telehealth: Payer: Self-pay | Admitting: Physician Assistant

## 2019-08-26 NOTE — Telephone Encounter (Signed)
Called to discuss the homebound Covid-19 vaccination initiative with the patient and/or caregiver.   Message left to call back our hotline [phone]  Angelena Form PA-C  [facility]

## 2019-08-26 NOTE — Telephone Encounter (Signed)
Information given so patient can receive a COVID vaccine at home.

## 2019-08-27 ENCOUNTER — Telehealth: Payer: Self-pay | Admitting: Physician Assistant

## 2019-08-27 NOTE — Telephone Encounter (Signed)
I connected by phone with [name] and/or patient's caregiver on 08/27/2019 at 4:51 PM to discuss the potential vaccination through our Homebound vaccination initiative.   Prevaccination Checklist for COVID-19 Vaccines  1.  Are you feeling sick today? no  2.  Have you ever received a dose of a COVID-19 vaccine?  no      If yes, which one? None   3.  Have you ever had an allergic reaction: (This would include a severe reaction [ e.g., anaphylaxis] that required treatment with epinephrine or EpiPen or that caused you to go to the hospital.  It would also include an allergic reaction that occurred within 4 hours that caused hives, swelling, or respiratory distress, including wheezing.) A.  A previous dose of COVID-19 vaccine. no  B.  A vaccine or injectable therapy that contains multiple components, one of which is a COVID-19 vaccine component, but it is not known which component elicited the immediate reaction. no  C.  Are you allergic to polyethylene glycol? no  D. Are you allergic to Polysorbate, which is found in some vaccines, film coated tablets and intravenous steroids?  no   4.  Have you ever had an allergic reaction to another vaccine (other than COVID-19 vaccine) or an injectable medication? (This would include a severe reaction [ e.g., anaphylaxis] that required treatment with epinephrine or EpiPen or that caused you to go to the hospital.  It would also include an allergic reaction that occurred within 4 hours that caused hives, swelling, or respiratory distress, including wheezing.)  no   5.  Have you ever had a severe allergic reaction (e.g., anaphylaxis) to something other than a component of the COVID-19 vaccine, or any vaccine or injectable medication?  This would include food, pet, venom, environmental, or oral medication allergies.  no   6.  Have you received any vaccine in the last 14 days? no   7.  Have you ever had a positive test for COVID-19 or has a doctor ever told  you that you had COVID-19?  no   8.  Have you received passive antibody therapy (monoclonal antibodies or convalescent serum) as a treatment for COVID-19? no   9.  Do you have a weakened immune system caused by something such as HIV infection or cancer or do you take immunosuppressive drugs or therapies?  no   10.  Do you have a bleeding disorder or are you taking a blood thinner? no   11.  Are you pregnant or breast-feeding? no   12.  Do you have dermal fillers? no   __________________   This patient is a 86 y.o. male that meets the FDA criteria to receive homebound vaccination. Patient or parent/caregiver understands they have the option to accept or refuse homebound vaccination.  Patient passed the pre-screening checklist and would like to proceed with homebound vaccination.  Based on questionnaire above, I recommend the patient be observed for 15 minutes.  There are no other household members/caregivers who are also interested in receiving the vaccine.   I will send the patient's information to our scheduling team who will reach out to schedule the patient and potential caregiver/family members for homebound vaccination.   Angelena Form PA-C 08/27/2019 4:51 PM

## 2019-08-29 ENCOUNTER — Telehealth: Payer: Self-pay | Admitting: Internal Medicine

## 2019-08-29 NOTE — Telephone Encounter (Signed)
Charrisse with [facility] stated the she spoke with the pts spouse refused further PT due to the spouse having the bug for about 2 weeks and did not want the therapist to come in to the home due to her having the bug and do not want to extend the Home PT.   If you should have any questions you can call Charrisse at [phone] can leave msg on the secured mailbox.

## 2019-08-30 DIAGNOSIS — I10 Essential (primary) hypertension: Secondary | ICD-10-CM | POA: Diagnosis not present

## 2019-09-04 ENCOUNTER — Ambulatory Visit: Payer: Medicare Other | Attending: Critical Care Medicine

## 2019-09-04 DIAGNOSIS — Z23 Encounter for immunization: Secondary | ICD-10-CM

## 2019-09-04 NOTE — Progress Notes (Signed)
   Covid-19 Vaccination Clinic  Name:  Erika Hussar    MRN: [id number] DOB: 1915-06-28  09/04/2019  Mr. Caniglia was observed post Covid-19 immunization for 15 minutes without incident. He was provided with Vaccine Information Sheet and instruction to access the V-Safe system.   Mr. Trefz was instructed to call 911 with any severe reactions post vaccine: Marland Kitchen Difficulty breathing  . Swelling of face and throat  . A fast heartbeat  . A bad rash all over body  . Dizziness and weakness   Immunizations Administered    Name Date Dose VIS Date Route   Moderna COVID-19 Vaccine 09/04/2019  1:34 PM 0.5 mL 01/2019 Intramuscular   Manufacturer: Moderna   Lot: [id number]   [place]: 97471-855-01

## 2019-09-30 ENCOUNTER — Ambulatory Visit: Payer: Medicare Other

## 2019-09-30 ENCOUNTER — Ambulatory Visit: Payer: Medicare Other | Attending: Critical Care Medicine

## 2019-09-30 DIAGNOSIS — Z23 Encounter for immunization: Secondary | ICD-10-CM

## 2019-09-30 NOTE — Progress Notes (Signed)
   Covid-19 Vaccination Clinic  Name:  Linden Tagliaferro    MRN: [id number] DOB: 16-Mar-1915  09/30/2019  Mr. Henkels was observed post Covid-19 immunization for 15 minutes without incident. He was provided with Vaccine Information Sheet and instruction to access the V-Safe system.   Mr. Montfort was instructed to call 911 with any severe reactions post vaccine: Marland Kitchen Difficulty breathing  . Swelling of face and throat  . A fast heartbeat  . A bad rash all over body  . Dizziness and weakness   Immunizations Administered    Name Date Dose VIS Date Route   Moderna COVID-19 Vaccine 09/30/2019 12:52 PM 0.5 mL 01/2019 Intramuscular   Manufacturer: Moderna   Lot: [id number]   [place]: 10175-102-58

## 2019-10-02 ENCOUNTER — Ambulatory Visit: Payer: Medicare Other

## 2019-10-05 ENCOUNTER — Other Ambulatory Visit: Payer: Self-pay | Admitting: Internal Medicine

## 2019-10-11 ENCOUNTER — Other Ambulatory Visit: Payer: Self-pay | Admitting: Internal Medicine

## 2019-10-11 DIAGNOSIS — N400 Enlarged prostate without lower urinary tract symptoms: Secondary | ICD-10-CM

## 2019-10-31 ENCOUNTER — Ambulatory Visit: Payer: Medicare Other | Admitting: Internal Medicine

## 2019-11-18 ENCOUNTER — Telehealth: Payer: Self-pay | Admitting: Internal Medicine

## 2019-11-18 NOTE — Telephone Encounter (Signed)
Form completed and mailed.  Wife is aware.

## 2019-11-18 NOTE — Telephone Encounter (Signed)
Howard Padilla mailed Disability Placard  When completed mail to:   [place], [place] [address]  Disposition: Dr's Folder

## 2020-03-09 ENCOUNTER — Ambulatory Visit: Payer: Medicare Other

## 2020-03-09 ENCOUNTER — Ambulatory Visit: Payer: Medicare Other | Attending: Critical Care Medicine

## 2020-03-09 DIAGNOSIS — Z23 Encounter for immunization: Secondary | ICD-10-CM

## 2020-03-09 NOTE — Progress Notes (Signed)
Covidobs  

## 2020-03-09 NOTE — Progress Notes (Signed)
   Covid-19 Vaccination Clinic  Name:  Howard Padilla    MRN: [id number] DOB: 05-14-1915  03/09/2020  Howard Padilla was observed post Covid-19 immunization for 15 minutes without incident. He was provided with Vaccine Information Sheet and instruction to access the V-Safe system.   Howard Padilla was instructed to call 911 with any severe reactions post vaccine: Marland Kitchen Difficulty breathing  . Swelling of face and throat  . A fast heartbeat  . A bad rash all over body  . Dizziness and weakness   Immunizations Administered    Name Date Dose VIS Date Route   Moderna Covid-19 Booster Vaccine 03/09/2020 10:00 AM 0.25 mL 12/17/2019 Intramuscular   Manufacturer: Moderna   Lot: [id number]   [place]: 25427-062-37

## 2020-03-28 ENCOUNTER — Emergency Department (HOSPITAL_COMMUNITY): Payer: Medicare Other

## 2020-03-28 ENCOUNTER — Inpatient Hospital Stay (HOSPITAL_COMMUNITY)
Admission: EM | Admit: 2020-03-28 | Discharge: 2020-03-30 | DRG: 388 | Disposition: A | Discharge status: Home Health | Payer: Medicare Other | Attending: Family Medicine | Admitting: Family Medicine

## 2020-03-28 ENCOUNTER — Encounter (HOSPITAL_COMMUNITY): Payer: Self-pay

## 2020-03-28 ENCOUNTER — Other Ambulatory Visit: Payer: Self-pay

## 2020-03-28 DIAGNOSIS — R64 Cachexia: Secondary | ICD-10-CM | POA: Diagnosis not present

## 2020-03-28 DIAGNOSIS — R1111 Vomiting without nausea: Principal | ICD-10-CM | POA: Diagnosis present

## 2020-03-28 DIAGNOSIS — Z880 Allergy status to penicillin: Secondary | ICD-10-CM

## 2020-03-28 DIAGNOSIS — N400 Enlarged prostate without lower urinary tract symptoms: Secondary | ICD-10-CM | POA: Diagnosis present

## 2020-03-28 DIAGNOSIS — I10 Essential (primary) hypertension: Secondary | ICD-10-CM | POA: Diagnosis not present

## 2020-03-28 DIAGNOSIS — K5641 Fecal impaction: Secondary | ICD-10-CM | POA: Diagnosis not present

## 2020-03-28 DIAGNOSIS — K56609 Unspecified intestinal obstruction, unspecified as to partial versus complete obstruction: Secondary | ICD-10-CM | POA: Diagnosis present

## 2020-03-28 DIAGNOSIS — Z20822 Contact with and (suspected) exposure to covid-19: Secondary | ICD-10-CM | POA: Diagnosis not present

## 2020-03-28 DIAGNOSIS — R143 Flatulence: Secondary | ICD-10-CM | POA: Diagnosis not present

## 2020-03-28 DIAGNOSIS — R488 Other symbolic dysfunctions: Secondary | ICD-10-CM | POA: Diagnosis present

## 2020-03-28 DIAGNOSIS — N401 Enlarged prostate with lower urinary tract symptoms: Secondary | ICD-10-CM | POA: Diagnosis present

## 2020-03-28 DIAGNOSIS — Z87891 Personal history of nicotine dependence: Secondary | ICD-10-CM

## 2020-03-28 DIAGNOSIS — Z7982 Long term (current) use of aspirin: Secondary | ICD-10-CM

## 2020-03-28 DIAGNOSIS — N132 Hydronephrosis with renal and ureteral calculous obstruction: Secondary | ICD-10-CM | POA: Diagnosis present

## 2020-03-28 DIAGNOSIS — Z8673 Personal history of transient ischemic attack (TIA), and cerebral infarction without residual deficits: Secondary | ICD-10-CM

## 2020-03-28 DIAGNOSIS — Z9049 Acquired absence of other specified parts of digestive tract: Secondary | ICD-10-CM

## 2020-03-28 DIAGNOSIS — Z66 Do not resuscitate: Secondary | ICD-10-CM | POA: Diagnosis not present

## 2020-03-28 DIAGNOSIS — K56699 Other intestinal obstruction unspecified as to partial versus complete obstruction: Secondary | ICD-10-CM

## 2020-03-28 DIAGNOSIS — F0390 Unspecified dementia without behavioral disturbance: Secondary | ICD-10-CM | POA: Diagnosis present

## 2020-03-28 DIAGNOSIS — N1330 Unspecified hydronephrosis: Secondary | ICD-10-CM

## 2020-03-28 DIAGNOSIS — Z79899 Other long term (current) drug therapy: Secondary | ICD-10-CM

## 2020-03-28 DIAGNOSIS — N131 Hydronephrosis with ureteral stricture, not elsewhere classified: Secondary | ICD-10-CM | POA: Diagnosis not present

## 2020-03-28 DIAGNOSIS — N179 Acute kidney failure, unspecified: Secondary | ICD-10-CM | POA: Diagnosis present

## 2020-03-28 DIAGNOSIS — Z86718 Personal history of other venous thrombosis and embolism: Secondary | ICD-10-CM

## 2020-03-28 DIAGNOSIS — R54 Age-related physical debility: Secondary | ICD-10-CM | POA: Diagnosis present

## 2020-03-28 DIAGNOSIS — S72001A Fracture of unspecified part of neck of right femur, initial encounter for closed fracture: Secondary | ICD-10-CM | POA: Diagnosis not present

## 2020-03-28 DIAGNOSIS — D631 Anemia in chronic kidney disease: Secondary | ICD-10-CM | POA: Diagnosis present

## 2020-03-28 DIAGNOSIS — E785 Hyperlipidemia, unspecified: Secondary | ICD-10-CM | POA: Diagnosis present

## 2020-03-28 DIAGNOSIS — I1310 Hypertensive heart and chronic kidney disease without heart failure, with stage 1 through stage 4 chronic kidney disease, or unspecified chronic kidney disease: Secondary | ICD-10-CM | POA: Diagnosis present

## 2020-03-28 DIAGNOSIS — I959 Hypotension, unspecified: Secondary | ICD-10-CM | POA: Diagnosis not present

## 2020-03-28 DIAGNOSIS — N183 Chronic kidney disease, stage 3 unspecified: Secondary | ICD-10-CM | POA: Diagnosis present

## 2020-03-28 DIAGNOSIS — R911 Solitary pulmonary nodule: Secondary | ICD-10-CM | POA: Diagnosis not present

## 2020-03-28 LAB — CBC WITH DIFFERENTIAL/PLATELET
Abs Immature Granulocytes: 0.02 10*3/uL (ref 0.00–0.07)
Basophils Absolute: 0 10*3/uL (ref 0.0–0.1)
Basophils Relative: 0 %
Eosinophils Absolute: 0 10*3/uL (ref 0.0–0.5)
Eosinophils Relative: 0 %
HCT: 34.1 % — ABNORMAL LOW (ref 39.0–52.0)
Hemoglobin: 11 g/dL — ABNORMAL LOW (ref 13.0–17.0)
Immature Granulocytes: 0 %
Lymphocytes Relative: 9 %
Lymphs Abs: 0.4 10*3/uL — ABNORMAL LOW (ref 0.7–4.0)
MCH: 31.9 pg (ref 26.0–34.0)
MCHC: 32.3 g/dL (ref 30.0–36.0)
MCV: 98.8 fL (ref 80.0–100.0)
Monocytes Absolute: 0.3 10*3/uL (ref 0.1–1.0)
Monocytes Relative: 6 %
Neutro Abs: 4.3 10*3/uL (ref 1.7–7.7)
Neutrophils Relative %: 85 %
Platelets: 249 10*3/uL (ref 150–400)
RBC: 3.45 MIL/uL — ABNORMAL LOW (ref 4.22–5.81)
RDW: 15.2 % (ref 11.5–15.5)
WBC: 5.1 10*3/uL (ref 4.0–10.5)
nRBC: 0 % (ref 0.0–0.2)

## 2020-03-28 LAB — COMPREHENSIVE METABOLIC PANEL
ALT: 17 U/L (ref 0–44)
AST: 20 U/L (ref 15–41)
Albumin: 3.3 g/dL — ABNORMAL LOW (ref 3.5–5.0)
Alkaline Phosphatase: 92 U/L (ref 38–126)
Anion gap: 12 (ref 5–15)
BUN: 38 mg/dL — ABNORMAL HIGH (ref 8–23)
CO2: 26 mmol/L (ref 22–32)
Calcium: 9.4 mg/dL (ref 8.9–10.3)
Chloride: 108 mmol/L (ref 98–111)
Creatinine, Ser: 1.75 mg/dL — ABNORMAL HIGH (ref 0.61–1.24)
GFR, Estimated: 34 mL/min — ABNORMAL LOW (ref >60)
Glucose, Bld: 142 mg/dL — ABNORMAL HIGH (ref 70–99)
Potassium: 5.1 mmol/L (ref 3.5–5.1)
Sodium: 146 mmol/L — ABNORMAL HIGH (ref 135–145)
Total Bilirubin: 0.6 mg/dL (ref 0.3–1.2)
Total Protein: 6.6 g/dL (ref 6.5–8.1)

## 2020-03-28 LAB — LIPASE, BLOOD: Lipase: 22 U/L (ref 11–51)

## 2020-03-28 MED ORDER — SODIUM CHLORIDE 0.9 % IV BOLUS
500.0000 mL | Freq: Once | INTRAVENOUS | Status: AC
  Administered 2020-03-28: 500 mL via INTRAVENOUS

## 2020-03-28 NOTE — ED Triage Notes (Signed)
Pt arrived via [facility] from home. Pt reports one vomiting episode prior to arrival. EMS reports dark vomit, but patient says he ate gravy for dinner. Pt is A&Ox2 according to EMS. Pt has hx of a stroke.   Vitals from EMS: HR: 80 Temp: 98.1 BP: 140/78 CBG: 208

## 2020-03-28 NOTE — ED Provider Notes (Signed)
Howard Padilla   CSN: [id number] Arrival date & time: 03/28/20  2120     History Chief Complaint  Patient presents with   Emesis    Howard Padilla is a 87 y.o. male.  87 year old male with medical history as detailed below presents for evaluation.  Patient arrives by EMS transport from home.  Patient reportedly vomited once at home prior to being sent to the ED for evaluation.  Level 5 caveat secondary to dementia.  Patient without significant complaint at time of my evaluation.  He does have vomitus on his shirt and pants.  Patient denies current pain.  He denies difficulty breathing.  Contact attempted with the patient's family at home.  No answer to initial phone call.  2250 Wife contacted at [phone].  Patient was fed cube steak this evening with gravy.  Shortly after dinner the patient vomited once.  Patient did not have further emesis.  The patient was otherwise without complaint.  DNR status is confirmed during this discussion with the wife.   The history is provided by the patient and medical records.  Illness Location:  Vomiting Severity:  Moderate Onset quality:  Sudden Duration:  1 hour Timing:  Unable to specify Progression:  Unchanged Chronicity:  New      Past Medical History:  Diagnosis Date   BPH (benign prostatic hypertrophy)    Cancer (HCC)    colon   Dementia (HCC)    DVT (deep venous thrombosis) ([place])    with pulmonary embo.   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   Lung abscess ([place]) 06/22/2013   CKD (chronic kidney disease) stage 3, GFR 30-59 ml/min ([place]) 06/22/2013   OTHER ABNORMAL BLOOD CHEMISTRY 02/03/2010   PERSONAL HISTORY OF THROMBOPHLEBITIS 02/03/2010   WEIGHT LOSS 10/29/2008   DVT 06/24/2007   LEG PAIN, LEFT 05/28/2007   SYNCOPE 05/28/2007   ACUTE BRONCHITIS 05/25/2007   Anemia of renal disease 11/05/2006   HLD (hyperlipidemia) 07/30/2006   Essential hypertension 07/30/2006   BPH (benign prostatic hyperplasia) 07/30/2006   History of malignant neoplasm of large intestine 07/30/2006    Past Surgical History:  Procedure Laterality Date   COLON SURGERY     partial colectomy   HERNIA REPAIR     ingunial       Family History  Family history unknown: Yes    Social History   Tobacco Use   Smoking status: Former Smoker    Packs/day: 2.00    Years: 30.00    Pack years: 60.00    Types: Cigarettes    Quit date: 02/28/1983    Years since quitting: 37.1   Smokeless tobacco: Never Used  Substance Use Topics   Alcohol use: No   Drug use: No    Home Medications Prior to Admission medications   Medication Sig Start Date End Date Taking? Authorizing Provider  acetaminophen (TYLENOL) 500 MG tablet Take 2 tablets (1,000 mg total) by mouth every 6 (six) hours as needed.  Patient taking differently: Take 1,000 mg by mouth every 6 (six) hours as needed for headache (pain).  08/25/17   Roxan Hockey, MD  amLODipine (NORVASC) 10 MG tablet TAKE 1 TABLET BY MOUTH EVERY DAY FOR BLOOD PRESSURE 10/06/19   Isaac Bliss, Rayford Halsted, MD  amLODipine (NORVASC) 5 MG tablet Take 1 tablet (5 mg total) by mouth daily. 08/15/18   Isaac Bliss, Rayford Halsted, MD  aspirin 81 MG chewable tablet Chew 1 tablet (81 mg total) by mouth daily. With Breakfast 08/26/17   Roxan Hockey, MD  doxazosin (CARDURA) 4 MG tablet TAKE 1/2 TABLET BY MOUTH DAILY 10/14/19   Isaac Bliss,  Rayford Halsted, MD  Ensure (ENSURE) Take 237 mLs by mouth 2 (two) times daily between meals. 06/24/19   Isaac Bliss, Rayford Halsted, MD  Multiple Vitamin (MULTIVITAMIN WITH MINERALS) TABS tablet Take 1 tablet by mouth daily.    [provider]  ondansetron (ZOFRAN ODT) 4 MG disintegrating tablet Take 1 tablet (4 mg total) by mouth every 8 (eight) hours as needed for nausea or vomiting. 08/25/17   Roxan Hockey, MD  vitamin C (ASCORBIC ACID) 500 MG tablet Take 500 mg by mouth daily.    [provider]    Allergies    Penicillins  Review of Systems   Review of Systems  All other systems reviewed and are negative.   Physical Exam Updated Vital Signs BP 118/64 (BP Location: Left Arm)    Pulse 80    Temp 97.8 F (36.6 C) (Oral)    Resp 15    SpO2 99%   Physical Exam Vitals and nursing Padilla reviewed.  Constitutional:      General: He is not in acute distress.    Appearance: Normal appearance. He is well-developed and well-nourished.  HENT:     Head: Normocephalic and atraumatic.     Mouth/Throat:     Mouth: Oropharynx is clear and moist.  Eyes:     Extraocular Movements: EOM normal.     Conjunctiva/sclera: Conjunctivae normal.     Pupils: Pupils are equal, round, and reactive to light.  Cardiovascular:     Rate and Rhythm: Normal rate and regular rhythm.     Heart sounds: Normal heart sounds.  Pulmonary:     Effort: Pulmonary effort is normal. No respiratory distress.     Breath sounds: Normal breath sounds.  Abdominal:     General: There is no distension.     Palpations: Abdomen is soft.     Tenderness: There is no abdominal tenderness.  Musculoskeletal:        General: No deformity or edema. Normal range of motion.     Cervical back: Normal range of motion and neck supple.  Skin:    General: Skin is warm and dry.  Neurological:     General: No focal deficit present.     Mental Status: He is alert. Mental status is at baseline.     Cranial Nerves: No cranial  nerve deficit.     Sensory: No sensory deficit.     Motor: No weakness.     Coordination: Coordination normal.     Comments: Demented  Pleasantly confused  Alert and Oriented to person    Psychiatric:        Mood and Affect: Mood and affect normal.     ED Results / Procedures / Treatments   Labs (all labs ordered are listed, but only abnormal results are displayed) Labs Reviewed  CBC WITH DIFFERENTIAL/PLATELET - Abnormal;  Notable for the following components:      Result Value   RBC 3.45 (*)    Hemoglobin 11.0 (*)    HCT 34.1 (*)    Lymphs Abs 0.4 (*)    All other components within normal limits  SARS CORONAVIRUS 2 BY RT PCR (HOSPITAL ORDER, [facility])  COMPREHENSIVE METABOLIC PANEL  LIPASE, BLOOD    EKG EKG Interpretation  Date/Time:  Sunday March 28 2020 21:44:25 EST Ventricular Rate:  76 PR Interval:    QRS Duration: 107 QT Interval:  399 QTC Calculation: 449 R Axis:   -72 Text Interpretation: Accelerated junctional rhythm Left anterior fascicular block Abnormal R-wave progression, early transition Anteroseptal infarct, old Baseline wander in lead(s) V5 Confirmed by Dene Gentry [phone]) on 03/28/2020 10:10:54 PM   Radiology DG Abdomen Acute W/Chest  Result Date: 03/28/2020 CLINICAL DATA:  Emesis.  Nausea and vomiting. EXAM: DG ABDOMEN ACUTE WITH 1 VIEW CHEST COMPARISON:  Chest x-ray dated September 24, 2017. FINDINGS: There are new spiculated appearing pulmonary nodules bilaterally, the largest located in the left mid lung zone while the other is located at the right lung base. The heart size is enlarged but relatively stable from prior study. The thoracic aorta appears to be dilated. There are atherosclerotic changes of the thoracic aorta. There is no pneumothorax. There may be a small right-sided pleural effusion. No definite acute osseous abnormality. There are dilated loops of small bowel in the mid abdomen. There is no definite  pneumatosis or free air. There are advanced degenerative changes of the visualized thoracolumbar spine and bilateral hips. There is diffuse osteopenia. IMPRESSION: 1. No acute cardiopulmonary process. 2. Spiculated appearing bilateral pulmonary nodules. Follow-up with an outpatient CT of the chest is recommended. 3. Dilated loops of small bowel in the mid abdomen concerning for developing small bowel obstruction or ileus. 4. Cardiomegaly.  Dilated and likely aneurysmal thoracic aorta. Electronically Signed   By: Constance Holster M.D.   On: 03/28/2020 22:18    Procedures Procedures   Medications Ordered in ED Medications  sodium chloride 0.9 % bolus 500 mL (has no administration in time range)    ED Course  I have reviewed the triage vital signs and the nursing notes.  Pertinent labs & imaging results that were available during my care of the patient were reviewed by me and considered in my medical decision making (see chart for details).    MDM Rules/Calculators/A&P                          MDM  Screen complete  Jowell Shorter was evaluated in Emergency Department on 03/28/2020 for the symptoms described in the history of present illness. He was evaluated in the context of the global COVID-19 pandemic, which necessitated consideration that the patient might be at risk for infection with the SARS-CoV-2 virus that causes COVID-19. Institutional protocols and algorithms that pertain to the evaluation of patients at risk for COVID-19 are in a state of rapid change based on information released by regulatory bodies including the CDC and federal and state organizations. These policies and algorithms were followed during the patient's care in the ED.  Patient presenting for evaluation of reported emesis.  Patient's wife provide a history over the phone.  Patient reportedly with one episode of emesis at home this evening.   DNR confirmed with wife over phone.   Patient without apparent  distress on initial evaluation.   Abd  XR suggestive of possible SBO. CT pending.   Dr. Dayna Barker aware of pending CT and dispo.      Final Clinical Impression(s) / ED Diagnoses Final diagnoses:  Vomiting without nausea, intractability of vomiting not specified, unspecified vomiting type    Rx / DC Orders ED Discharge Orders    None       Valarie Merino, MD 03/28/20 2330

## 2020-03-28 NOTE — ED Provider Notes (Signed)
11:34 PM Assumed care from Dr. Francia Greaves, please see their note for full history, physical and decision making until this point. In brief this is a 87 y.o. year old male who presented to the ED tonight with Emesis     One episode of emesis. xr concerning. Plan for CT and likely discharge.   CT results show small bowel obstruction, likely prostate cancer with left ureteral obstruction, questionable right hip fracture.  On my evaluation patient's abdomen is taut but not distended.  No obvious masses are palpable.  His right hip is not tender to palpation particularly.  I discussed with urology (Dr. Junious Silk) who did not feel like he needed emergent ureteral stent however if his kidney function did not improve with gentle hydration and resolution of his bowel obstruction and they can be consulted for consideration at that time.  I discussed with his wife who stated that patient is DNR and would not want any kind of extensive surgical procedure but would be okay with minor procedures.  I discussed with the hospitalist for observation for the small bowel obstruction.    Labs, studies and imaging reviewed by myself and considered in medical decision making if ordered. Imaging interpreted by radiology.  Labs Reviewed  CBC WITH DIFFERENTIAL/PLATELET - Abnormal; Notable for the following components:      Result Value   RBC 3.45 (*)    Hemoglobin 11.0 (*)    HCT 34.1 (*)    Lymphs Abs 0.4 (*)    All other components within normal limits  SARS CORONAVIRUS 2 BY RT PCR (HOSPITAL ORDER, [facility])  COMPREHENSIVE METABOLIC PANEL  LIPASE, BLOOD    DG Abdomen Acute W/Chest  Final Result    CT Abdomen Pelvis W Contrast    (Results Pending)    No follow-ups on file.    Lonia Roane, Corene Cornea, MD 03/29/20 [phone]

## 2020-03-29 ENCOUNTER — Emergency Department (HOSPITAL_COMMUNITY): Payer: Medicare Other

## 2020-03-29 ENCOUNTER — Encounter (HOSPITAL_COMMUNITY): Payer: Self-pay

## 2020-03-29 DIAGNOSIS — Z7982 Long term (current) use of aspirin: Secondary | ICD-10-CM | POA: Diagnosis not present

## 2020-03-29 DIAGNOSIS — K5900 Constipation, unspecified: Secondary | ICD-10-CM | POA: Diagnosis not present

## 2020-03-29 DIAGNOSIS — I959 Hypotension, unspecified: Secondary | ICD-10-CM | POA: Diagnosis not present

## 2020-03-29 DIAGNOSIS — Z79899 Other long term (current) drug therapy: Secondary | ICD-10-CM | POA: Diagnosis not present

## 2020-03-29 DIAGNOSIS — N183 Chronic kidney disease, stage 3 unspecified: Secondary | ICD-10-CM | POA: Diagnosis present

## 2020-03-29 DIAGNOSIS — R54 Age-related physical debility: Secondary | ICD-10-CM | POA: Diagnosis present

## 2020-03-29 DIAGNOSIS — Z87891 Personal history of nicotine dependence: Secondary | ICD-10-CM | POA: Diagnosis not present

## 2020-03-29 DIAGNOSIS — Z8673 Personal history of transient ischemic attack (TIA), and cerebral infarction without residual deficits: Secondary | ICD-10-CM | POA: Diagnosis not present

## 2020-03-29 DIAGNOSIS — Z20822 Contact with and (suspected) exposure to covid-19: Secondary | ICD-10-CM | POA: Diagnosis present

## 2020-03-29 DIAGNOSIS — R64 Cachexia: Secondary | ICD-10-CM | POA: Diagnosis present

## 2020-03-29 DIAGNOSIS — N401 Enlarged prostate with lower urinary tract symptoms: Secondary | ICD-10-CM | POA: Diagnosis present

## 2020-03-29 DIAGNOSIS — I10 Essential (primary) hypertension: Secondary | ICD-10-CM | POA: Diagnosis not present

## 2020-03-29 DIAGNOSIS — D631 Anemia in chronic kidney disease: Secondary | ICD-10-CM | POA: Diagnosis present

## 2020-03-29 DIAGNOSIS — S72001A Fracture of unspecified part of neck of right femur, initial encounter for closed fracture: Secondary | ICD-10-CM | POA: Diagnosis present

## 2020-03-29 DIAGNOSIS — K5641 Fecal impaction: Secondary | ICD-10-CM | POA: Diagnosis present

## 2020-03-29 DIAGNOSIS — Z66 Do not resuscitate: Secondary | ICD-10-CM | POA: Diagnosis present

## 2020-03-29 DIAGNOSIS — Z880 Allergy status to penicillin: Secondary | ICD-10-CM | POA: Diagnosis not present

## 2020-03-29 DIAGNOSIS — Z9049 Acquired absence of other specified parts of digestive tract: Secondary | ICD-10-CM | POA: Diagnosis not present

## 2020-03-29 DIAGNOSIS — N132 Hydronephrosis with renal and ureteral calculous obstruction: Secondary | ICD-10-CM | POA: Diagnosis present

## 2020-03-29 DIAGNOSIS — N1330 Unspecified hydronephrosis: Secondary | ICD-10-CM | POA: Diagnosis not present

## 2020-03-29 DIAGNOSIS — Z7401 Bed confinement status: Secondary | ICD-10-CM | POA: Diagnosis not present

## 2020-03-29 DIAGNOSIS — F0390 Unspecified dementia without behavioral disturbance: Secondary | ICD-10-CM | POA: Diagnosis present

## 2020-03-29 DIAGNOSIS — Z86718 Personal history of other venous thrombosis and embolism: Secondary | ICD-10-CM | POA: Diagnosis not present

## 2020-03-29 DIAGNOSIS — R112 Nausea with vomiting, unspecified: Secondary | ICD-10-CM | POA: Diagnosis not present

## 2020-03-29 DIAGNOSIS — M2550 Pain in unspecified joint: Secondary | ICD-10-CM | POA: Diagnosis not present

## 2020-03-29 DIAGNOSIS — E785 Hyperlipidemia, unspecified: Secondary | ICD-10-CM | POA: Diagnosis present

## 2020-03-29 DIAGNOSIS — R1111 Vomiting without nausea: Secondary | ICD-10-CM | POA: Diagnosis present

## 2020-03-29 DIAGNOSIS — R531 Weakness: Secondary | ICD-10-CM | POA: Diagnosis not present

## 2020-03-29 DIAGNOSIS — K56609 Unspecified intestinal obstruction, unspecified as to partial versus complete obstruction: Secondary | ICD-10-CM

## 2020-03-29 DIAGNOSIS — N179 Acute kidney failure, unspecified: Secondary | ICD-10-CM | POA: Diagnosis present

## 2020-03-29 DIAGNOSIS — R488 Other symbolic dysfunctions: Secondary | ICD-10-CM | POA: Diagnosis present

## 2020-03-29 DIAGNOSIS — R143 Flatulence: Secondary | ICD-10-CM | POA: Diagnosis not present

## 2020-03-29 DIAGNOSIS — I1310 Hypertensive heart and chronic kidney disease without heart failure, with stage 1 through stage 4 chronic kidney disease, or unspecified chronic kidney disease: Secondary | ICD-10-CM | POA: Diagnosis present

## 2020-03-29 LAB — SARS CORONAVIRUS 2 BY RT PCR (HOSPITAL ORDER, PERFORMED IN ~~LOC~~ HOSPITAL LAB): SARS Coronavirus 2: NEGATIVE

## 2020-03-29 LAB — CBG MONITORING, ED
Glucose-Capillary: 116 mg/dL — ABNORMAL HIGH (ref 70–99)
Glucose-Capillary: 119 mg/dL — ABNORMAL HIGH (ref 70–99)
Glucose-Capillary: 118 mg/dL — ABNORMAL HIGH (ref 70–99)

## 2020-03-29 MED ORDER — HYDRALAZINE HCL 20 MG/ML IJ SOLN: 10.0000 mg | INTRAMUSCULAR | Status: DC | PRN

## 2020-03-29 MED ORDER — DEXTROSE-NACL 5-0.9 % IV SOLN: INTRAVENOUS | Status: AC

## 2020-03-29 MED ORDER — LACTATED RINGERS IV BOLUS
1000.0000 mL | Freq: Once | INTRAVENOUS | Status: AC
  Administered 2020-03-29: 1000 mL via INTRAVENOUS

## 2020-03-29 MED ORDER — SORBITOL 70 % SOLN
960.0000 mL | TOPICAL_OIL | Freq: Once | ORAL | Status: AC
  Administered 2020-03-29: 960 mL via RECTAL
  Filled 2020-03-29 (×2): qty 473

## 2020-03-29 MED ORDER — IOHEXOL 300 MG/ML  SOLN
75.0000 mL | Freq: Once | INTRAMUSCULAR | Status: AC | PRN
  Administered 2020-03-29: 75 mL via INTRAVENOUS

## 2020-03-29 MED ORDER — ACETAMINOPHEN 325 MG PO TABS: 650.0000 mg | ORAL_TABLET | Freq: Four times a day (QID) | ORAL | Status: DC | PRN

## 2020-03-29 MED ORDER — ONDANSETRON HCL 4 MG/2ML IJ SOLN
4.0000 mg | Freq: Once | INTRAMUSCULAR | Status: AC
  Administered 2020-03-29: 4 mg via INTRAVENOUS
  Filled 2020-03-29: qty 2

## 2020-03-29 MED ORDER — ACETAMINOPHEN 650 MG RE SUPP: 650.0000 mg | Freq: Four times a day (QID) | RECTAL | Status: DC | PRN

## 2020-03-29 NOTE — ED Notes (Signed)
Patient's family updated on status and plan of care

## 2020-03-29 NOTE — ED Notes (Signed)
Wife provided updates on patient

## 2020-03-29 NOTE — ED Notes (Signed)
Per Jackelyn Poling, RN receiving patient, floor needs 15 minutes to finish zapping room and to get a low bed for patient.

## 2020-03-29 NOTE — Plan of Care (Signed)
Pt admitted with SBO, constipation. Pt is A&O x 2 person & place. Pleasant, placed on tele. I placed a phone call to pts dtr & to wife [name] updating them both.

## 2020-03-29 NOTE — ED Notes (Addendum)
Patient got out of bed again and was standing at the door. Patient assisted back to bed by this RN. Bed alarm on and patient given another warm blanket.

## 2020-03-29 NOTE — ED Notes (Signed)
Updates provided to family.  

## 2020-03-29 NOTE — ED Notes (Signed)
Cancelled consult to General Surgery per EDP, Mesner,MD.

## 2020-03-29 NOTE — Consult Note (Signed)
Randee Upchurch 02/10/1916  [id number].    Requesting MD: Dr. Verneita Griffes Chief Complaint/Reason for Consult: SBO, constipation  HPI:  This is a very sweet 87 yo black male who is confused and unable to provide any history.  Per the chart he had some dark emesis at home possibly yesterday.  This can not be confirmed with the patient.  He states he had a BM yesterday, but this is also difficult to confirm.  He currently denies any abdominal pain.  He was brought to the ED secondary to the above.  He underwent a CT scan that questions a SBO with possible transition in the RLQ but not definitively seen, large prostate worrisome for malignancy with severe L hydroureteronephrosis.  He also has a large rectal stool ball noted on scan as well.  We have been asked to see the patient for further recommendations.  ROS: ROS: unable to fully obtain due to confusion   Family History  Family history unknown: Yes    Past Medical History:  Diagnosis Date  . BPH (benign prostatic hypertrophy)   . Cancer ([place])    colon  . Dementia ([place])   . DVT (deep venous thrombosis) ([place])    with pulmonary embo.  Marland Kitchen Hyperlipidemia   . Hypertension     Past Surgical History:  Procedure Laterality Date  . COLON SURGERY     partial colectomy  . HERNIA REPAIR     ingunial    Social History:  reports that he quit smoking about 37 years ago. His smoking use included cigarettes. He has a 60.00 pack-year smoking history. He has never used smokeless tobacco. He reports that he does not drink alcohol and does not use drugs.  Allergies:  Allergies  Allergen Reactions  . Penicillins Rash and Other (See Comments)    Has patient had a PCN reaction causing immediate rash, facial/tongue/throat swelling, SOB or lightheadedness with hypotension: No Has patient had a PCN reaction causing severe rash involving mucus membranes or skin necrosis: No Has patient had a PCN reaction that required hospitalization No Has  patient had a PCN reaction occurring within the last 10 years: No If all of the above answers are "NO", then may proceed with Cephalosporin use.     (Not in a hospital admission)    Physical Exam: Blood pressure (!) 145/84, pulse 92, temperature 98.8 F (37.1 C), resp. rate 18, SpO2 92 %. General: pleasant, frail, elderly black male who is laying in bed in NAD HEENT: head is normocephalic, atraumatic.  Sclera are noninjected.  PERRL.  Ears and nose without any masses or lesions.  Mouth is pink and moist Heart: regular, rate, and rhythm.  Normal s1,s2. No obvious murmurs, gallops, or rubs noted.  Palpable radial and pedal pulses bilaterally Lungs: CTAB, no wheezes, rhonchi, or rales noted.  Respiratory effort nonlabored Abd: soft, NT, ND, very skinny, +BS, no masses, hernias, or organomegaly Rectal: soft stool in rectal vault, no large stool ball noted as far as I could get.  Decreased rectal tone, likely secondary to age.  Some blood noted from enema secondary to hemorrhoids.  MS: all 4 extremities are symmetrical with no cyanosis, clubbing, or edema. + muscle wasting and atrophy Psych: Alert and oriented to self, but not time or situation   Results for orders placed or performed during the hospital encounter of 03/28/20 (from the past 48 hour(s))  Comprehensive metabolic panel     Status: Abnormal   Collection Time: 03/28/20  9:50 PM  Result Value Ref Range   Sodium 146 (H) 135 - 145 mmol/L   Potassium 5.1 3.5 - 5.1 mmol/L   Chloride 108 98 - 111 mmol/L   CO2 26 22 - 32 mmol/L   Glucose, Bld 142 (H) 70 - 99 mg/dL    Comment: Glucose reference range applies only to samples taken after fasting for at least 8 hours.   BUN 38 (H) 8 - 23 mg/dL   Creatinine, Ser 1.75 (H) 0.61 - 1.24 mg/dL   Calcium 9.4 8.9 - 10.3 mg/dL   Total Protein 6.6 6.5 - 8.1 g/dL   Albumin 3.3 (L) 3.5 - 5.0 g/dL   AST 20 15 - 41 U/L   ALT 17 0 - 44 U/L   Alkaline Phosphatase 92 38 - 126 U/L   Total Bilirubin  0.6 0.3 - 1.2 mg/dL   GFR, Estimated 34 (L) >60 mL/min    Comment: (NOTE) Calculated using the CKD-EPI Creatinine Equation (2021)    Anion gap 12 5 - 15    Comment: Performed at [facility], [place] [address] [place], [place] [address]  Lipase, blood     Status: None   Collection Time: 03/28/20  9:50 PM  Result Value Ref Range   Lipase 22 11 - 51 U/L    Comment: Performed at [facility], [place] [address], [place], [place] [address]  CBC with Differential     Status: Abnormal   Collection Time: 03/28/20  9:50 PM  Result Value Ref Range   WBC 5.1 4.0 - 10.5 K/uL   RBC 3.45 (L) 4.22 - 5.81 MIL/uL   Hemoglobin 11.0 (L) 13.0 - 17.0 g/dL   HCT 34.1 (L) 39.0 - 52.0 %   MCV 98.8 80.0 - 100.0 fL   MCH 31.9 26.0 - 34.0 pg   MCHC 32.3 30.0 - 36.0 g/dL   RDW 15.2 11.5 - 15.5 %   Platelets 249 150 - 400 K/uL   nRBC 0.0 0.0 - 0.2 %   Neutrophils Relative % 85 %   Neutro Abs 4.3 1.7 - 7.7 K/uL   Lymphocytes Relative 9 %   Lymphs Abs 0.4 (L) 0.7 - 4.0 K/uL   Monocytes Relative 6 %   Monocytes Absolute 0.3 0.1 - 1.0 K/uL   Eosinophils Relative 0 %   Eosinophils Absolute 0.0 0.0 - 0.5 K/uL   Basophils Relative 0 %   Basophils Absolute 0.0 0.0 - 0.1 K/uL   Immature Granulocytes 0 %   Abs Immature Granulocytes 0.02 0.00 - 0.07 K/uL    Comment: Performed at [facility], [place] [address], [place], [place] [address]  SARS Coronavirus 2 by RT PCR (hospital order, performed in [facility] hospital lab) Nasopharyngeal Nasopharyngeal Swab     Status: None   Collection Time: 03/28/20 10:32 PM   Specimen: Nasopharyngeal Swab  Result Value Ref Range   SARS Coronavirus 2 NEGATIVE NEGATIVE    Comment: (NOTE) SARS-CoV-2 target nucleic acids are NOT DETECTED.  The SARS-CoV-2 RNA is generally detectable in upper and lower respiratory specimens during the acute phase of infection. The lowest concentration of SARS-CoV-2 viral copies this assay can  detect is 250 copies / mL. A negative result does not preclude SARS-CoV-2 infection and should not be used as the sole basis for treatment or other patient management decisions.  A negative result may occur with improper specimen collection / handling, submission of specimen other than nasopharyngeal swab, presence of viral  mutation(s) within the areas targeted by this assay, and inadequate number of viral copies (<250 copies / mL). A negative result must be combined with clinical observations, patient history, and epidemiological information.  Fact Sheet for Patients:   StrictlyIdeas.no  Fact Sheet for Healthcare Providers: BankingDealers.co.za  This test is not yet approved or  cleared by the [place] FDA and has been authorized for detection and/or diagnosis of SARS-CoV-2 by FDA under an Emergency Use Authorization (EUA).  This EUA will remain in effect (meaning this test can be used) for the duration of the COVID-19 declaration under Section 564(b)(1) of the Act, 21 U.S.C. section 360bbb-3(b)(1), unless the authorization is terminated or revoked sooner.  Performed at [facility], [place] [address], [place], [place] [address]   CBG monitoring, ED     Status: Abnormal   Collection Time: 03/29/20  6:22 AM  Result Value Ref Range   Glucose-Capillary 116 (H) 70 - 99 mg/dL    Comment: Glucose reference range applies only to samples taken after fasting for at least 8 hours.  CBG monitoring, ED     Status: Abnormal   Collection Time: 03/29/20 11:49 AM  Result Value Ref Range   Glucose-Capillary 119 (H) 70 - 99 mg/dL    Comment: Glucose reference range applies only to samples taken after fasting for at least 8 hours.   CT Abdomen Pelvis W Contrast  Result Date: 03/29/2020 CLINICAL DATA:  Abdominal pain, distention, emesis EXAM: CT ABDOMEN AND PELVIS WITH CONTRAST TECHNIQUE: Multidetector CT imaging of the abdomen  and pelvis was performed using the standard protocol following bolus administration of intravenous contrast. CONTRAST:  37mL OMNIPAQUE IOHEXOL 300 MG/ML  SOLN COMPARISON:  Radiograph 03/28/2020 FINDINGS: Lower chest: Some coarsened reticular changes in ground-glass opacity noted in the lung bases. Mild pulmonary vascular congestion is noted as well. Borderline cardiomegaly. Three-vessel coronary artery atherosclerosis. No pericardial effusion. Hepatobiliary: No visible focal liver lesion. Smooth liver surface contour. Gallbladder is not well visualized. Correlate for prior cholecystectomy versus decompression. Intra and extrahepatic biliary ductal dilatation may be related to patient's advanced age/senescent changes. Pancreas: Mild pancreatic atrophy. No pancreatic ductal dilatation or surrounding inflammatory changes. Spleen: Normal in size. No concerning splenic lesions. Adrenals/Urinary Tract: Normal adrenal glands. Bilateral cortical thinning. Asymmetric severe left hydroureteronephrosis to the level of a heterogeneous mass which appears to arise from the prostate gland likely resulting in some mass effect or direct involvement of the ureter proper. No right urinary tract dilatation. No visible obstructing urolithiasis. Fluid attenuation cyst are seen bilaterally. No concerning renal mass. Stomach/Bowel: Evaluation of the bowel and mesentery limited by a marked paucity of intraperitoneal fat and lack of enteric contrast media. Distal esophagus and stomach are unremarkable. Diffuse air and fluid-filled appearance of the small bowel with a site of possible transition in the right lower quadrant (5/79, 2/56). Colon is free of significant thickening or dilatation. Rectal stool ball measuring up to 8.5 cm in diameter, correlate for symptoms of impaction. Postsurgical changes noted about the rectosigmoid, compatible with a history of prior colectomy. Feculent material between the gluteal cleft. Vascular/Lymphatic:  Extensive severe atherosclerotic plaque throughout the abdominal aorta and branch vessels. Irregular multifocal fusiform aneurysmal dilatation throughout the abdominal aorta measuring up to 3 cm in maximal diameter. Calcified noncalcified plaque results in some fairly proximal occlusion of the right internal iliac artery and branches of the left internal iliac artery, incompletely characterized on this non angiographic technique. Evaluation of the lymph nodes is limited in a  paucity of intraperitoneal fat. Several enhancing nodes may be present the deep pelvis in the region anterior to the bladder. Reproductive: Marked enlargement and heterogeneity of the prostate gland and seminal vesicles. Suspect this results in some direct mass effect or involvement of the left ureter given the upstream obstruction. Appearance is highly worrisome for prostate malignancy. Correlate with PSA and tissue sampling as appropriate. High-riding appearance of the right testis. Other: Paucity of subcutaneous and intraperitoneal fat. Diffuse body wall edema. Soft tissue thickening and stranding superficial to the sacrum, correlate with visual inspection to exclude decubitus ulceration. No discernible abdominopelvic free air or fluid. No organized abscess or collection is seen. Musculoskeletal: Diffusely mottled appearance of the osseous structures with marked bony demineralization. Multilevel discogenic and facet degenerative changes and Schmorl's node formations are present. Impacted deformity of the right subcapital femoral neck. Possibly acute with fracture line difficult to discern in the setting of demineralization. IMPRESSION: 1. Evidence of small-bowel obstruction with possible transition point in the right lower quadrant, etiology unclear. 2. Marked enlargement and heterogeneity of the prostate gland and seminal vesicles. Appearance is highly worrisome for prostate malignancy. Correlate with PSA and tissue sampling as appropriate.  Several enhancing nodes may be present the deep pelvis in the region anterior to the bladder, cannot exclude metastatic involvement. Diffusely mottled appearance of the osseous structures could reflect some osseous involvement, though finding is on a background diffuse bony demineralization. 3. Severe left hydroureteronephrosis possibly secondary to direct involvement or obstruction by the prostate lesion detailed above. Obstructive urolithiasis. Bilateral renal cortical thinning noted as well. 4. Impacted deformity of the right subcapital femoral neck. Possibly acute with fracture line difficult to discern in the setting of demineralization. Recommend correlation with point tenderness. 5. Rectal stool ball measuring up to 8.5 cm in diameter, correlate for symptoms of impaction. 6. Nonvisualization of the gallbladder, correlate for surgical history. Prominence of the biliary tree may reflect post cholecystectomy reservoir effect or senescent change. 7.  Aortic Atherosclerosis (ICD10-I70.0). 8. Calcified noncalcified atheromatous plaque results in proximal occlusion of the right internal iliac artery and more distal branches of the left internal iliac artery, incompletely characterized on this non angiographic technique. 9. Multifocal fusiform infrarenal abdominal aortic aneurysm measuring up to 3 cm in diameter. Recommend follow-up ultrasound every 3 years. This recommendation follows ACR consensus guidelines: White Paper of the ACR Incidental Findings Committee II on Vascular Findings. J Am Coll Radiol 2013; 10:789-794. 10. Likely chronic interstitial lung disease and bronchitic changes in the lung bases, incompletely characterized on this exam. Nodules seen on comparison radiography are not well visualized. Outpatient CT of the chest is recommended. These results were called by telephone at the time of interpretation on 03/29/2020 at 1:06 am to provider [facility] , who verbally acknowledged these results.  Electronically Signed   By: Lovena Le M.D.   On: 03/29/2020 01:08   DG Abdomen Acute W/Chest  Result Date: 03/28/2020 CLINICAL DATA:  Emesis.  Nausea and vomiting. EXAM: DG ABDOMEN ACUTE WITH 1 VIEW CHEST COMPARISON:  Chest x-ray dated September 24, 2017. FINDINGS: There are new spiculated appearing pulmonary nodules bilaterally, the largest located in the left mid lung zone while the other is located at the right lung base. The heart size is enlarged but relatively stable from prior study. The thoracic aorta appears to be dilated. There are atherosclerotic changes of the thoracic aorta. There is no pneumothorax. There may be a small right-sided pleural effusion. No definite acute osseous abnormality. There are dilated  loops of small bowel in the mid abdomen. There is no definite pneumatosis or free air. There are advanced degenerative changes of the visualized thoracolumbar spine and bilateral hips. There is diffuse osteopenia. IMPRESSION: 1. No acute cardiopulmonary process. 2. Spiculated appearing bilateral pulmonary nodules. Follow-up with an outpatient CT of the chest is recommended. 3. Dilated loops of small bowel in the mid abdomen concerning for developing small bowel obstruction or ileus. 4. Cardiomegaly.  Dilated and likely aneurysmal thoracic aorta. Electronically Signed   By: Constance Holster M.D.   On: 03/28/2020 22:18      Assessment/Plan HTN HLD H/O DVT with PE Dementia - currently confused, unclear if this is his baseline Enlarged prostate with severe L hydroureteronephrosis - per medicine Possible hip fx vs demineralization - per medicine  SBO/constipation The patient has had no further emesis that we can tell.  He seems very comfortable currently and has no abdominal pain, distention, etc.  He was given a SMOG enema for his large rectal stool ball with some results.  Minimal soft stool was noted on rectal exam.  Would hold on NGT at this time and allow clear liquids and see how he  does.  I have a low suspicion for a bowel obstruction, but we will monitor and follow for now.     FEN - CLD/IVFs VTE - ok for chemical prophylaxis from our standpoint ID - none currently  Henreitta Cea, [facility] Surgery 03/29/2020, 12:25 PM Please see Amion for pager number during day hours 7:00am-4:30pm or 7:00am -11:30am on weekends

## 2020-03-29 NOTE — Progress Notes (Signed)
Patient seen and examined and agree with plan of care as per my partner who admitted patient this morning 87 year old black male history of BPH HTN episodic dark emesis CT scan shows SBO with transition point markedly enlarged prostate left-sided hydroureteronephrosis and right hip fracture According to ER note is DNR  Sodium 136 potassium 5.1 BUN/creatinine 38/1.7 WBC 5.1  Nursing reports has been trying to get out of bed and actually walked out of bed several times this morning he has not had any further vomiting History is severely limited by patient understanding and possible delirium/dementia he cannot tell me where he is what the places and has some echolalia  On exam BP 139/84   Pulse 99   Temp 97.8 F (36.6 C) (Oral)   Resp 14   SpO2 100%  EOMI NCAT no focal deficit very cachectic frail S1-S2 no murmur no rub no gallop Abdomen seems soft nondistended no rebound no guarding Surprisingly ROM to both lower extremities is intact he has no focal deficit he is able to externally and internally rotate both hips so I do not think he has a hip fracture Neurologically he is quite weak  ?  SBO-a lot of stool on CT scan-General surgery consulted may be giving purgatives and laxatives We will keep on IV saline 75 cc an hour-monitor for effect He has an enlarged prostate and probable prostate cancer but I do not know how criticality is surgery I will have him discuss this further with his wife I called both his wife number and his daughter's number and did not receive a response he has mild AKI so he will continue IV fluid Palliative care has been consulted we will keep him n.p.o. until we can determine if he actually has an SBO if this is just a lot of stool  No charge

## 2020-03-29 NOTE — ED Notes (Signed)
Surgical PA at bedside  

## 2020-03-29 NOTE — H&P (Signed)
History and Physical    Loghan Markel NKN:[id number] DOB: May 24, 1915 DOA: 03/28/2020  PCP: Philip Aspen, Limmie Patricia, MD  Patient coming from: Home.  History obtained from ER physician as patient appears confused at the time of my exam.  Chief Complaint: Nausea vomiting.  HPI: Leelan Holdeman is a 87 y.o. male with history of hypertension and BPH was brought to the ER after patient had an episode of dark vomitus at home.  Unable to reach patient's spouse at this time.  Most of the history was obtained from ER physician as patient appears confused.  ED Course: In the ER patient was mildly confused and CT abdomen pelvis done shows features concerning for small bowel obstruction with transition point.  No further episodes of vomiting in the ER.  In addition CT scan also shows features concerning for markedly enlarged prostate with left-sided hydroureteronephrosis concerning features for possible prostate malignancy with metastasis.  Also concerning for right hip fracture features.  Labs are significant for creatinine 1.7 which appears to be almost at baseline.  Anemia Covid test was negative.  Patient's wife had stated to the ER physician that she is not expecting any surgical measures of any active measures except for conservative measures.  Also confirmed that patient is a DNR to the ER physician.  Review of Systems: As per HPI, rest all negative.   Past Medical History:  Diagnosis Date  . BPH (benign prostatic hypertrophy)   . Cancer (HCC)    colon  . Dementia (HCC)   . DVT (deep venous thrombosis) (HCC)    with pulmonary embo.  Marland Kitchen Hyperlipidemia   . Hypertension     Past Surgical History:  Procedure Laterality Date  . COLON SURGERY     partial colectomy  . HERNIA REPAIR     ingunial     reports that he quit smoking about 37 years ago. His smoking use included cigarettes. He has a 60.00 pack-year smoking history. He has never used smokeless tobacco. He reports that he does  not drink alcohol and does not use drugs.  Allergies  Allergen Reactions  . Penicillins Rash and Other (See Comments)    Has patient had a PCN reaction causing immediate rash, facial/tongue/throat swelling, SOB or lightheadedness with hypotension: No Has patient had a PCN reaction causing severe rash involving mucus membranes or skin necrosis: No Has patient had a PCN reaction that required hospitalization No Has patient had a PCN reaction occurring within the last 10 years: No If all of the above answers are "NO", then may proceed with Cephalosporin use.     Family History  Family history unknown: Yes    Prior to Admission medications   Medication Sig Start Date End Date Taking? Authorizing Provider  acetaminophen (TYLENOL) 500 MG tablet Take 2 tablets (1,000 mg total) by mouth every 6 (six) hours as needed. Patient taking differently: Take 1,000 mg by mouth every 6 (six) hours as needed for headache (pain). 08/25/17   Shon Hale, MD  amLODipine (NORVASC) 10 MG tablet TAKE 1 TABLET BY MOUTH EVERY DAY FOR BLOOD PRESSURE 10/06/19   Philip Aspen, Limmie Patricia, MD  amLODipine (NORVASC) 5 MG tablet Take 1 tablet (5 mg total) by mouth daily. Patient not taking: Reported on 03/29/2020 08/15/18   Philip Aspen, Limmie Patricia, MD  aspirin 81 MG chewable tablet Chew 1 tablet (81 mg total) by mouth daily. With Breakfast 08/26/17   Shon Hale, MD  doxazosin (CARDURA) 4 MG tablet TAKE 1/2 TABLET BY MOUTH  DAILY 10/14/19   Isaac Bliss, Rayford Halsted, MD  Ensure (ENSURE) Take 237 mLs by mouth 2 (two) times daily between meals. 06/24/19   Isaac Bliss, Rayford Halsted, MD  Multiple Vitamin (MULTIVITAMIN WITH MINERALS) TABS tablet Take 1 tablet by mouth daily.    [provider]  ondansetron (ZOFRAN ODT) 4 MG disintegrating tablet Take 1 tablet (4 mg total) by mouth every 8 (eight) hours as needed for nausea or vomiting. Patient not taking: Reported on 03/29/2020 08/25/17   Roxan Hockey, MD   vitamin C (ASCORBIC ACID) 500 MG tablet Take 500 mg by mouth daily.    [provider]    Physical Exam: Constitutional: Moderately built and nourished. Vitals:   03/28/20 2300 03/28/20 2330 03/29/20 0000 03/29/20 0200  BP: 140/78 120/70 (!) 135/93 (!) 116/93  Pulse: 83 76 96 86  Resp: 16  17 14   Temp:      TempSrc:      SpO2: 95% 97% 99% 94%   Eyes: Anicteric no pallor. ENMT: No discharge from the ears eyes nose or mouth. Neck: No mass felt.  No neck rigidity. Respiratory: No rhonchi or crepitations. Cardiovascular: S1-S2 heard. Abdomen: Soft nontender bowel sounds not appreciated. Musculoskeletal: No edema. Skin: No rash. Neurologic: Alert awake oriented to his name otherwise confused moving all extremities. Psychiatric: Appears confused.   Labs on Admission: I have personally reviewed following labs and imaging studies  CBC: Recent Labs  Lab 03/28/20 2150  WBC 5.1  NEUTROABS 4.3  HGB 11.0*  HCT 34.1*  MCV 98.8  PLT 0000000   Basic Metabolic Panel: Recent Labs  Lab 03/28/20 2150  NA 146*  K 5.1  CL 108  CO2 26  GLUCOSE 142*  BUN 38*  CREATININE 1.75*  CALCIUM 9.4   GFR: CrCl cannot be calculated (Unknown ideal weight.). Liver Function Tests: Recent Labs  Lab 03/28/20 2150  AST 20  ALT 17  ALKPHOS 92  BILITOT 0.6  PROT 6.6  ALBUMIN 3.3*   Recent Labs  Lab 03/28/20 2150  LIPASE 22   No results for input(s): AMMONIA in the last 168 hours. Coagulation Profile: No results for input(s): INR, PROTIME in the last 168 hours. Cardiac Enzymes: No results for input(s): CKTOTAL, CKMB, CKMBINDEX, TROPONINI in the last 168 hours. BNP (last 3 results) No results for input(s): PROBNP in the last 8760 hours. HbA1C: No results for input(s): HGBA1C in the last 72 hours. CBG: No results for input(s): GLUCAP in the last 168 hours. Lipid Profile: No results for input(s): CHOL, HDL, LDLCALC, TRIG, CHOLHDL, LDLDIRECT in the last 72 hours. Thyroid  Function Tests: No results for input(s): TSH, T4TOTAL, FREET4, T3FREE, THYROIDAB in the last 72 hours. Anemia Panel: No results for input(s): VITAMINB12, FOLATE, FERRITIN, TIBC, IRON, RETICCTPCT in the last 72 hours. Urine analysis:    Component Value Date/Time   COLORURINE YELLOW 09/24/2017 1616   APPEARANCEUR CLEAR 09/24/2017 1616   LABSPEC 1.017 09/24/2017 1616   PHURINE 6.0 09/24/2017 1616   GLUCOSEU NEGATIVE 09/24/2017 1616   HGBUR NEGATIVE 09/24/2017 1616   BILIRUBINUR NEGATIVE 09/24/2017 1616   KETONESUR NEGATIVE 09/24/2017 1616   PROTEINUR NEGATIVE 09/24/2017 1616   UROBILINOGEN 0.2 01/06/2015 0927   NITRITE NEGATIVE 09/24/2017 1616   LEUKOCYTESUR NEGATIVE 09/24/2017 1616   Sepsis Labs: @LABRCNTIP (procalcitonin:4,lacticidven:4) ) Recent Results (from the past 240 hour(s))  SARS Coronavirus 2 by RT PCR (hospital order, performed in [place] hospital lab) Nasopharyngeal Nasopharyngeal Swab     Status: None   Collection  Time: 03/28/20 10:32 PM   Specimen: Nasopharyngeal Swab  Result Value Ref Range Status   SARS Coronavirus 2 NEGATIVE NEGATIVE Final    Comment: (NOTE) SARS-CoV-2 target nucleic acids are NOT DETECTED.  The SARS-CoV-2 RNA is generally detectable in upper and lower respiratory specimens during the acute phase of infection. The lowest concentration of SARS-CoV-2 viral copies this assay can detect is 250 copies / mL. A negative result does not preclude SARS-CoV-2 infection and should not be used as the sole basis for treatment or other patient management decisions.  A negative result may occur with improper specimen collection / handling, submission of specimen other than nasopharyngeal swab, presence of viral mutation(s) within the areas targeted by this assay, and inadequate number of viral copies (<250 copies / mL). A negative result must be combined with clinical observations, patient history, and epidemiological information.  Fact Sheet for  Patients:   StrictlyIdeas.no  Fact Sheet for Healthcare Providers: BankingDealers.co.za  This test is not yet approved or  cleared by the [place] FDA and has been authorized for detection and/or diagnosis of SARS-CoV-2 by FDA under an Emergency Use Authorization (EUA).  This EUA will remain in effect (meaning this test can be used) for the duration of the COVID-19 declaration under Section 564(b)(1) of the Act, 21 U.S.C. section 360bbb-3(b)(1), unless the authorization is terminated or revoked sooner.  Performed at [facility], [place] [address], [place], [place] [address]      Radiological Exams on Admission: CT Abdomen Pelvis W Contrast  Result Date: 03/29/2020 CLINICAL DATA:  Abdominal pain, distention, emesis EXAM: CT ABDOMEN AND PELVIS WITH CONTRAST TECHNIQUE: Multidetector CT imaging of the abdomen and pelvis was performed using the standard protocol following bolus administration of intravenous contrast. CONTRAST:  48mL OMNIPAQUE IOHEXOL 300 MG/ML  SOLN COMPARISON:  Radiograph 03/28/2020 FINDINGS: Lower chest: Some coarsened reticular changes in ground-glass opacity noted in the lung bases. Mild pulmonary vascular congestion is noted as well. Borderline cardiomegaly. Three-vessel coronary artery atherosclerosis. No pericardial effusion. Hepatobiliary: No visible focal liver lesion. Smooth liver surface contour. Gallbladder is not well visualized. Correlate for prior cholecystectomy versus decompression. Intra and extrahepatic biliary ductal dilatation may be related to patient's advanced age/senescent changes. Pancreas: Mild pancreatic atrophy. No pancreatic ductal dilatation or surrounding inflammatory changes. Spleen: Normal in size. No concerning splenic lesions. Adrenals/Urinary Tract: Normal adrenal glands. Bilateral cortical thinning. Asymmetric severe left hydroureteronephrosis to the level of a heterogeneous  mass which appears to arise from the prostate gland likely resulting in some mass effect or direct involvement of the ureter proper. No right urinary tract dilatation. No visible obstructing urolithiasis. Fluid attenuation cyst are seen bilaterally. No concerning renal mass. Stomach/Bowel: Evaluation of the bowel and mesentery limited by a marked paucity of intraperitoneal fat and lack of enteric contrast media. Distal esophagus and stomach are unremarkable. Diffuse air and fluid-filled appearance of the small bowel with a site of possible transition in the right lower quadrant (5/79, 2/56). Colon is free of significant thickening or dilatation. Rectal stool ball measuring up to 8.5 cm in diameter, correlate for symptoms of impaction. Postsurgical changes noted about the rectosigmoid, compatible with a history of prior colectomy. Feculent material between the gluteal cleft. Vascular/Lymphatic: Extensive severe atherosclerotic plaque throughout the abdominal aorta and branch vessels. Irregular multifocal fusiform aneurysmal dilatation throughout the abdominal aorta measuring up to 3 cm in maximal diameter. Calcified noncalcified plaque results in some fairly proximal occlusion of the right internal iliac artery and branches of  the left internal iliac artery, incompletely characterized on this non angiographic technique. Evaluation of the lymph nodes is limited in a paucity of intraperitoneal fat. Several enhancing nodes may be present the deep pelvis in the region anterior to the bladder. Reproductive: Marked enlargement and heterogeneity of the prostate gland and seminal vesicles. Suspect this results in some direct mass effect or involvement of the left ureter given the upstream obstruction. Appearance is highly worrisome for prostate malignancy. Correlate with PSA and tissue sampling as appropriate. High-riding appearance of the right testis. Other: Paucity of subcutaneous and intraperitoneal fat. Diffuse body  wall edema. Soft tissue thickening and stranding superficial to the sacrum, correlate with visual inspection to exclude decubitus ulceration. No discernible abdominopelvic free air or fluid. No organized abscess or collection is seen. Musculoskeletal: Diffusely mottled appearance of the osseous structures with marked bony demineralization. Multilevel discogenic and facet degenerative changes and Schmorl's node formations are present. Impacted deformity of the right subcapital femoral neck. Possibly acute with fracture line difficult to discern in the setting of demineralization. IMPRESSION: 1. Evidence of small-bowel obstruction with possible transition point in the right lower quadrant, etiology unclear. 2. Marked enlargement and heterogeneity of the prostate gland and seminal vesicles. Appearance is highly worrisome for prostate malignancy. Correlate with PSA and tissue sampling as appropriate. Several enhancing nodes may be present the deep pelvis in the region anterior to the bladder, cannot exclude metastatic involvement. Diffusely mottled appearance of the osseous structures could reflect some osseous involvement, though finding is on a background diffuse bony demineralization. 3. Severe left hydroureteronephrosis possibly secondary to direct involvement or obstruction by the prostate lesion detailed above. Obstructive urolithiasis. Bilateral renal cortical thinning noted as well. 4. Impacted deformity of the right subcapital femoral neck. Possibly acute with fracture line difficult to discern in the setting of demineralization. Recommend correlation with point tenderness. 5. Rectal stool ball measuring up to 8.5 cm in diameter, correlate for symptoms of impaction. 6. Nonvisualization of the gallbladder, correlate for surgical history. Prominence of the biliary tree may reflect post cholecystectomy reservoir effect or senescent change. 7.  Aortic Atherosclerosis (ICD10-I70.0). 8. Calcified noncalcified  atheromatous plaque results in proximal occlusion of the right internal iliac artery and more distal branches of the left internal iliac artery, incompletely characterized on this non angiographic technique. 9. Multifocal fusiform infrarenal abdominal aortic aneurysm measuring up to 3 cm in diameter. Recommend follow-up ultrasound every 3 years. This recommendation follows ACR consensus guidelines: White Paper of the ACR Incidental Findings Committee II on Vascular Findings. J Am Coll Radiol 2013; 10:789-794. 10. Likely chronic interstitial lung disease and bronchitic changes in the lung bases, incompletely characterized on this exam. Nodules seen on comparison radiography are not well visualized. Outpatient CT of the chest is recommended. These results were called by telephone at the time of interpretation on 03/29/2020 at 1:06 am to provider [facility] , who verbally acknowledged these results. Electronically Signed   By: Lovena Le M.D.   On: 03/29/2020 01:08   DG Abdomen Acute W/Chest  Result Date: 03/28/2020 CLINICAL DATA:  Emesis.  Nausea and vomiting. EXAM: DG ABDOMEN ACUTE WITH 1 VIEW CHEST COMPARISON:  Chest x-ray dated September 24, 2017. FINDINGS: There are new spiculated appearing pulmonary nodules bilaterally, the largest located in the left mid lung zone while the other is located at the right lung base. The heart size is enlarged but relatively stable from prior study. The thoracic aorta appears to be dilated. There are atherosclerotic changes of the thoracic  aorta. There is no pneumothorax. There may be a small right-sided pleural effusion. No definite acute osseous abnormality. There are dilated loops of small bowel in the mid abdomen. There is no definite pneumatosis or free air. There are advanced degenerative changes of the visualized thoracolumbar spine and bilateral hips. There is diffuse osteopenia. IMPRESSION: 1. No acute cardiopulmonary process. 2. Spiculated appearing bilateral pulmonary  nodules. Follow-up with an outpatient CT of the chest is recommended. 3. Dilated loops of small bowel in the mid abdomen concerning for developing small bowel obstruction or ileus. 4. Cardiomegaly.  Dilated and likely aneurysmal thoracic aorta. Electronically Signed   By: Constance Holster M.D.   On: 03/28/2020 22:18    EKG: Independently reviewed.  There is some artifact we will repeat EKG.  Assessment/Plan Principal Problem:   SBO (small bowel obstruction) (HCC) Active Problems:   HLD (hyperlipidemia)   Essential hypertension   BPH (benign prostatic hyperplasia)   CKD (chronic kidney disease) stage 3, GFR 30-59 ml/min (HCC)   Closed right hip fracture ([place])    1. Small bowel obstruction has not had any further episodes of vomiting in the ER.  We will keep patient n.p.o. fluids and pain medication consult general surgery.  If patient has further episodes of vomiting will attempt NG tube. 2. Markedly enlarged prostate with left-sided hydroureteronephrosis causing obstructive uropathy creatinine appears to be at baseline.  There is concerning features for metastatic prostate disease.  ER physician has discussed with on-call urologist Dr. Junious Silk who at this time advised to closely monitor intake output and creatinine if it were further worsens or output decreases to reconsult them. 3. Possible fracture of the right hip.  Will need to get orthopedic consult. 4. Chronic kidney disease stage III creatinine appears to be at baseline. 5. Hypertension we will keep patient on as needed IV hydralazine while patient n.p.o. 6. Anemia follow CBC. 7. Appears confused not sure patient has history of dementia.  May be from acute delirium.  Will reassess after patient settles.  May need CT head if persistent.  Patient's wife had stated to the ER physician that she is not expecting any surgical measures for patient's obstruction and other features.  Will consult palliative care for goals of care.  Since  patient has bowel obstruction will need inpatient status.   DVT prophylaxis: SCDs for now. Code Status: DNR as patient had discussed with the ER physician. Family Communication: Unable to reach patient's wife. Disposition Plan: To be determined. Consults called: ER physician as discussed with urologist Dr. Junious Silk.  Consult palliative care and general surgery. Admission status: Inpatient.   Rise Patience MD [facility] Pager [phone].  If 7PM-7AM, please contact night-coverage [email or web address] Password [facility]  03/29/2020, 4:47 AM

## 2020-03-29 NOTE — Consult Note (Signed)
Consultation: Left hydronephrosis, enlarged prostate Requested by: Dr. Merrily Pew  History of Present Illness: Howard Padilla is a 87 year old African-American male who was admitted with nausea and vomiting, possible small bowel obstruction and fecal impaction.  On CT scan he had moderate left-sided hydroureteronephrosis down to a markedly enlarged prostate with a large intravesical extension and extension over into the left pelvis.  There was no obvious pelvic lymphadenopathy but several nodes may be present in the pelvis anterior to the bladder.  There were no aggressive bone lesions but diffuse demineralization.  Patient also noted to have a paucity of fat on the CT consistent with his cachexia.  Creatinine was 1.75 with a baseline creatinine of 1.4-1.8 over the past 6 years.  He denies any problem with urination currently.  He has a history of BPH and saw Dr. Vikki Ports in 2016 for urinary retention.  He passed a voiding trial.  He is on doxazosin.  Past Medical History:  Diagnosis Date  . BPH (benign prostatic hypertrophy)   . Cancer ([employer])    colon  . Dementia ([employer] [place])   . DVT (deep venous thrombosis) ([place])    with pulmonary embo.  Marland Kitchen Hyperlipidemia   . Hypertension    Past Surgical History:  Procedure Laterality Date  . COLON SURGERY     partial colectomy  . HERNIA REPAIR     ingunial    Home Medications:  (Not in a hospital admission)  Allergies:  Allergies  Allergen Reactions  . Penicillins Rash and Other (See Comments)    Has patient had a PCN reaction causing immediate rash, facial/tongue/throat swelling, SOB or lightheadedness with hypotension: No Has patient had a PCN reaction causing severe rash involving mucus membranes or skin necrosis: No Has patient had a PCN reaction that required hospitalization No Has patient had a PCN reaction occurring within the last 10 years: No If all of the above answers are "NO", then may proceed with Cephalosporin use.     Family  History  Family history unknown: Yes   Social History:  reports that he quit smoking about 37 years ago. His smoking use included cigarettes. He has a 60.00 pack-year smoking history. He has never used smokeless tobacco. He reports that he does not drink alcohol and does not use drugs.  ROS: A complete review of systems was performed.  All systems are negative except for pertinent findings as noted. Review of Systems  All other systems reviewed and are negative.    Physical Exam:  Vital signs in last 24 hours: Temp:  [97.8 F (36.6 C)-98.8 F (37.1 C)] 98.8 F (37.1 C) (01/31 0755) Pulse Rate:  [76-107] 107 (01/31 1530) Resp:  [14-19] 15 (01/31 1530) BP: (113-145)/(64-93) 143/85 (01/31 1530) SpO2:  [92 %-100 %] 95 % (01/31 1530) General: Sleepy but arousable. No acute distress. Pleasant. Thin, elderly.  HEENT: Normocephalic, atraumatic Cardiovascular: Regular rate and rhythm Lungs: Regular rate and effort Abdomen: Soft, nontender, nondistended, no abdominal masses Back: No CVA tenderness Extremities: No edema Neurologic: Grossly intact  Laboratory Data:  Results for orders placed or performed during the hospital encounter of 03/28/20 (from the past 24 hour(s))  Comprehensive metabolic panel     Status: Abnormal   Collection Time: 03/28/20  9:50 PM  Result Value Ref Range   Sodium 146 (H) 135 - 145 mmol/L   Potassium 5.1 3.5 - 5.1 mmol/L   Chloride 108 98 - 111 mmol/L   CO2 26 22 - 32 mmol/L   Glucose, Bld 142 (  H) 70 - 99 mg/dL   BUN 38 (H) 8 - 23 mg/dL   Creatinine, Ser 1.75 (H) 0.61 - 1.24 mg/dL   Calcium 9.4 8.9 - 10.3 mg/dL   Total Protein 6.6 6.5 - 8.1 g/dL   Albumin 3.3 (L) 3.5 - 5.0 g/dL   AST 20 15 - 41 U/L   ALT 17 0 - 44 U/L   Alkaline Phosphatase 92 38 - 126 U/L   Total Bilirubin 0.6 0.3 - 1.2 mg/dL   GFR, Estimated 34 (L) >60 mL/min   Anion gap 12 5 - 15  Lipase, blood     Status: None   Collection Time: 03/28/20  9:50 PM  Result Value Ref Range    Lipase 22 11 - 51 U/L  CBC with Differential     Status: Abnormal   Collection Time: 03/28/20  9:50 PM  Result Value Ref Range   WBC 5.1 4.0 - 10.5 K/uL   RBC 3.45 (L) 4.22 - 5.81 MIL/uL   Hemoglobin 11.0 (L) 13.0 - 17.0 g/dL   HCT 34.1 (L) 39.0 - 52.0 %   MCV 98.8 80.0 - 100.0 fL   MCH 31.9 26.0 - 34.0 pg   MCHC 32.3 30.0 - 36.0 g/dL   RDW 15.2 11.5 - 15.5 %   Platelets 249 150 - 400 K/uL   nRBC 0.0 0.0 - 0.2 %   Neutrophils Relative % 85 %   Neutro Abs 4.3 1.7 - 7.7 K/uL   Lymphocytes Relative 9 %   Lymphs Abs 0.4 (L) 0.7 - 4.0 K/uL   Monocytes Relative 6 %   Monocytes Absolute 0.3 0.1 - 1.0 K/uL   Eosinophils Relative 0 %   Eosinophils Absolute 0.0 0.0 - 0.5 K/uL   Basophils Relative 0 %   Basophils Absolute 0.0 0.0 - 0.1 K/uL   Immature Granulocytes 0 %   Abs Immature Granulocytes 0.02 0.00 - 0.07 K/uL  SARS Coronavirus 2 by RT PCR (hospital order, performed in [place] hospital lab) Nasopharyngeal Nasopharyngeal Swab     Status: None   Collection Time: 03/28/20 10:32 PM   Specimen: Nasopharyngeal Swab  Result Value Ref Range   SARS Coronavirus 2 NEGATIVE NEGATIVE  CBG monitoring, ED     Status: Abnormal   Collection Time: 03/29/20  6:22 AM  Result Value Ref Range   Glucose-Capillary 116 (H) 70 - 99 mg/dL  CBG monitoring, ED     Status: Abnormal   Collection Time: 03/29/20 11:49 AM  Result Value Ref Range   Glucose-Capillary 119 (H) 70 - 99 mg/dL   Recent Results (from the past 240 hour(s))  SARS Coronavirus 2 by RT PCR (hospital order, performed in [place] hospital lab) Nasopharyngeal Nasopharyngeal Swab     Status: None   Collection Time: 03/28/20 10:32 PM   Specimen: Nasopharyngeal Swab  Result Value Ref Range Status   SARS Coronavirus 2 NEGATIVE NEGATIVE Final    Comment: (NOTE) SARS-CoV-2 target nucleic acids are NOT DETECTED.  The SARS-CoV-2 RNA is generally detectable in upper and lower respiratory specimens during the acute phase of infection. The  lowest concentration of SARS-CoV-2 viral copies this assay can detect is 250 copies / mL. A negative result does not preclude SARS-CoV-2 infection and should not be used as the sole basis for treatment or other patient management decisions.  A negative result may occur with improper specimen collection / handling, submission of specimen other than nasopharyngeal swab, presence of viral mutation(s) within the areas targeted by  this assay, and inadequate number of viral copies (<250 copies / mL). A negative result must be combined with clinical observations, patient history, and epidemiological information.  Fact Sheet for Patients:   StrictlyIdeas.no  Fact Sheet for Healthcare Providers: BankingDealers.co.za  This test is not yet approved or  cleared by the [place] FDA and has been authorized for detection and/or diagnosis of SARS-CoV-2 by FDA under an Emergency Use Authorization (EUA).  This EUA will remain in effect (meaning this test can be used) for the duration of the COVID-19 declaration under Section 564(b)(1) of the Act, 21 U.S.C. section 360bbb-3(b)(1), unless the authorization is terminated or revoked sooner.  Performed at [facility], [place] [address], [place], [place] [address]    Creatinine: Recent Labs    03/28/20 2150  CREATININE 1.75*    Impression/Assessment/plan:  #1 prostate enlargement-this may be benign or malignant.  Would continue surveillance as there is no obvious sign of metastatic prostate cancer.  Patient would be a poor candidate for prostate biopsy and treatment given his frailty. Continue alpha blocker. Consider finasteride.   #2 left hydronephrosis-again would continue to monitor.  Patient creatinine stable.  Patient without flank pain or any need for urgent intervention.  Given the prostate enlargement a cystoscopy and stent would be very difficult and a stent would add  significant morbidity to him.  A left nephrostomy tube could be placed but again would add significant morbidity and the risks currently outweigh any benefit.  We will sign off but he can follow-up with urology as outpatient if he has improvement in his performance status.  Festus Aloe 03/29/2020, 4:39 PM

## 2020-03-29 NOTE — ED Notes (Signed)
This RN spoke with the patient's wife and gave an update.

## 2020-03-29 NOTE — ED Notes (Signed)
Pt transported to CT ?

## 2020-03-29 NOTE — ED Notes (Signed)
Patient found out of bed. Patient had stool in his brief. Patient was cleaned up, guided back to bed, and given new warm blankets.

## 2020-03-29 NOTE — ED Notes (Signed)
Enema administered with large BM and bright red blood.

## 2020-03-30 LAB — URINALYSIS, ROUTINE W REFLEX MICROSCOPIC
Bacteria, UA: NONE SEEN
Bilirubin Urine: NEGATIVE
Glucose, UA: NEGATIVE mg/dL
Hgb urine dipstick: NEGATIVE
Ketones, ur: NEGATIVE mg/dL
Leukocytes,Ua: NEGATIVE
Nitrite: NEGATIVE
Protein, ur: 30 mg/dL — AB
Specific Gravity, Urine: 1.02 (ref 1.005–1.030)
pH: 6 (ref 5.0–8.0)

## 2020-03-30 LAB — CBC WITH DIFFERENTIAL/PLATELET
Abs Immature Granulocytes: 0.08 10*3/uL — ABNORMAL HIGH (ref 0.00–0.07)
Basophils Absolute: 0 10*3/uL (ref 0.0–0.1)
Basophils Relative: 0 %
Eosinophils Absolute: 0 10*3/uL (ref 0.0–0.5)
Eosinophils Relative: 0 %
HCT: 31.1 % — ABNORMAL LOW (ref 39.0–52.0)
Hemoglobin: 10.1 g/dL — ABNORMAL LOW (ref 13.0–17.0)
Immature Granulocytes: 1 %
Lymphocytes Relative: 10 %
Lymphs Abs: 0.6 10*3/uL — ABNORMAL LOW (ref 0.7–4.0)
MCH: 32.5 pg (ref 26.0–34.0)
MCHC: 32.5 g/dL (ref 30.0–36.0)
MCV: 100 fL (ref 80.0–100.0)
Monocytes Absolute: 0.5 10*3/uL (ref 0.1–1.0)
Monocytes Relative: 9 %
Neutro Abs: 4.8 10*3/uL (ref 1.7–7.7)
Neutrophils Relative %: 80 %
Platelets: 183 10*3/uL (ref 150–400)
RBC: 3.11 MIL/uL — ABNORMAL LOW (ref 4.22–5.81)
RDW: 15.4 % (ref 11.5–15.5)
WBC: 6 10*3/uL (ref 4.0–10.5)
nRBC: 0 % (ref 0.0–0.2)

## 2020-03-30 LAB — COMPREHENSIVE METABOLIC PANEL
ALT: 16 U/L (ref 0–44)
AST: 22 U/L (ref 15–41)
Albumin: 3 g/dL — ABNORMAL LOW (ref 3.5–5.0)
Alkaline Phosphatase: 82 U/L (ref 38–126)
Anion gap: 9 (ref 5–15)
BUN: 30 mg/dL — ABNORMAL HIGH (ref 8–23)
CO2: 23 mmol/L (ref 22–32)
Calcium: 8.9 mg/dL (ref 8.9–10.3)
Chloride: 111 mmol/L (ref 98–111)
Creatinine, Ser: 1.38 mg/dL — ABNORMAL HIGH (ref 0.61–1.24)
GFR, Estimated: 45 mL/min — ABNORMAL LOW (ref >60)
Glucose, Bld: 106 mg/dL — ABNORMAL HIGH (ref 70–99)
Potassium: 3.9 mmol/L (ref 3.5–5.1)
Sodium: 143 mmol/L (ref 135–145)
Total Bilirubin: 0.8 mg/dL (ref 0.3–1.2)
Total Protein: 6 g/dL — ABNORMAL LOW (ref 6.5–8.1)

## 2020-03-30 LAB — GLUCOSE, CAPILLARY
Glucose-Capillary: 101 mg/dL — ABNORMAL HIGH (ref 70–99)
Glucose-Capillary: 130 mg/dL — ABNORMAL HIGH (ref 70–99)
Glucose-Capillary: 96 mg/dL (ref 70–99)

## 2020-03-30 MED ORDER — SENNA 8.6 MG PO TABS: 1.0000 | ORAL_TABLET | Freq: Every day | ORAL | 0 refills | Status: AC

## 2020-03-30 MED ORDER — DOCUSATE SODIUM 100 MG PO CAPS
100.0000 mg | ORAL_CAPSULE | Freq: Two times a day (BID) | ORAL | Status: DC
  Administered 2020-03-30: 100 mg via ORAL
  Filled 2020-03-30: qty 1

## 2020-03-30 MED ORDER — DEXTROSE-NACL 5-0.9 % IV SOLN: INTRAVENOUS | Status: DC

## 2020-03-30 MED ORDER — POLYETHYLENE GLYCOL 3350 17 G PO PACK
17.0000 g | PACK | Freq: Every day | ORAL | Status: DC
  Administered 2020-03-30: 17 g via ORAL
  Filled 2020-03-30: qty 1

## 2020-03-30 MED ORDER — BOOST / RESOURCE BREEZE PO LIQD CUSTOM
1.0000 | Freq: Three times a day (TID) | ORAL | Status: DC
  Administered 2020-03-30 (×2): 1 via ORAL

## 2020-03-30 MED ORDER — POLYETHYLENE GLYCOL 3350 17 G PO PACK: 17.0000 g | PACK | Freq: Every day | ORAL | 0 refills | Status: AC

## 2020-03-30 MED ORDER — SORBITOL 70 % SOLN
960.0000 mL | TOPICAL_OIL | Freq: Once | ORAL | Status: AC
  Administered 2020-03-30: 960 mL via RECTAL
  Filled 2020-03-30: qty 473

## 2020-03-30 NOTE — Discharge Instructions (Signed)
Bowel Obstruction A bowel obstruction means that something is blocking the small or large bowel. The bowel is also called the intestine. It is the long tube that connects the stomach to the opening of the butt (anus). When something blocks the bowel, food and fluids cannot pass through like normal. This condition needs to be treated. Treatment depends on the cause of the problem and how bad the problem is. What are the causes? Common causes of this condition include:  Scar tissue (adhesions) from past surgery or from high-energy X-rays (radiation).  Recent surgery in the belly. This affects how food moves in the bowel.  Some diseases, such as: ? Irritation of the lining of the digestive tract (Crohn's disease). ? Irritation of small pouches in the bowel (diverticulitis).  Growths or tumors.  A bulging organ (hernia).  Twisting of the bowel (volvulus).  A foreign body.  Slipping of a part of the bowel into another part (intussusception).   What are the signs or symptoms? Symptoms of this condition include:  Pain in the belly.  Feeling sick to your stomach (nauseous).  Throwing up (vomiting).  Bloating in the belly.  Being unable to pass gas.  Trouble pooping (constipation).  Watery poop (diarrhea).  A lot of belching. How is this diagnosed? This condition may be diagnosed based on:  A physical exam.  Medical history.  Imaging tests, such as X-ray or CT scan.  Blood tests.  Urine tests. How is this treated? Treatment for this condition may include:  Fluids and pain medicines that are given through an IV tube. Your doctor may tell you not to eat or drink if you feel sick to your stomach and are throwing up.  Eating a clear liquid diet for a few days.  Putting a small tube (nasogastric tube) into the stomach. This will help with pain, discomfort, and nausea by removing blocked air and fluids from the stomach.  Surgery. This may be needed if other treatments do  not work. Follow these instructions at home: Medicines  Take over-the-counter and prescription medicines only as told by your doctor.  If you were prescribed an antibiotic medicine, take it as told by your doctor. Do not stop taking the antibiotic even if you start to feel better. General instructions  Follow your diet as told by your doctor. You may need to: ? Only drink clear liquids until you start to get better. ? Avoid solid foods.  Return to your normal activities as told by your doctor. Ask your doctor what activities are safe for you.  Do not sit for a long time without moving. Get up to take short walks every 1-2 hours. This is important. Ask for help if you feel weak or unsteady.  Keep all follow-up visits as told by your doctor. This is important. How is this prevented? After having a bowel obstruction, you may be more likely to have another. You can do some things to stop it from happening again.  If you have a long-term (chronic) disease, contact your doctor if you see changes or problems.  Take steps to prevent or treat trouble pooping. Your doctor may ask that you: ? Drink enough fluid to keep your pee (urine) pale yellow. ? Take over-the-counter or prescription medicines. ? Eat foods that are high in fiber. These include beans, whole grains, and fresh fruits and vegetables. ? Limit foods that are high in fat and sugar. These include fried or sweet foods.  Stay active. Ask your doctor which  exercises are safe for you.  Avoid stress.  Eat three small meals and three small snacks each day.  Work with a Publishing rights manager (dietitian) to make a meal plan that works for you.  Do not use any products that contain nicotine or tobacco, such as cigarettes and e-cigarettes. If you need help quitting, ask your doctor.   Contact a doctor if:  You have a fever.  You have chills. Get help right away if:  You have pain or cramps that get worse.  You throw up blood.  You are  sick to your stomach.  You cannot stop throwing up.  You cannot drink fluids.  You feel mixed up (confused).  You feel very thirsty (dehydrated).  Your belly gets more bloated.  You feel weak or you pass out (faint). Summary  A bowel obstruction means that something is blocking the small or large bowel.  Treatment may include IV fluids and pain medicine. You may also have a clear liquid diet, a small tube in your stomach, or surgery.  Drink clear liquids and avoid solid foods until you get better. This information is not intended to replace advice given to you by your health care provider. Make sure you discuss any questions you have with your health care provider. Document Revised: 06/27/2017 Document Reviewed: 06/27/2017 Elsevier Patient Education  2021 [employer].   Nausea and Vomiting, Adult Nausea is feeling sick to your stomach or feeling that you are about to throw up (vomit). Vomiting is when food in your stomach is thrown up and out of the mouth. Throwing up can make you feel weak. It can also make you lose too much water in your body (get dehydrated). If you lose too much water in your body, you may:  Feel tired.  Feel thirsty.  Have a dry mouth.  Have cracked lips.  Go pee (urinate) less often. Older adults and people with other diseases or a weak body defense system (immune system) are at higher risk for losing too much water in the body. If you feel sick to your stomach and you throw up, it is important to follow instructions from your doctor about how to take care of yourself. Follow these instructions at home: Watch your symptoms for any changes. Tell your doctor about them. Follow these instructions to care for yourself at home. Eating and drinking  Take an ORS (oral rehydration solution). This is a drink that is sold at pharmacies and stores.  Drink clear fluids in small amounts as you are able, such as: ? Water. ? Ice chips. ? Fruit juice that has  water added (diluted fruit juice). ? Low-calorie sports drinks.  Eat bland, easy-to-digest foods in small amounts as you are able, such as: ? Bananas. ? Applesauce. ? Rice. ? Low-fat (lean) meats. ? Toast. ? Crackers.  Avoid drinking fluids that have a lot of sugar or caffeine in them. This includes energy drinks, sports drinks, and soda.  Avoid alcohol.  Avoid spicy or fatty foods.      General instructions  Take over-the-counter and prescription medicines only as told by your doctor.  Drink enough fluid to keep your pee (urine) pale yellow.  Wash your hands often with soap and water. If you cannot use soap and water, use hand sanitizer.  Make sure that all people in your home wash their hands well and often.  Rest at home while you get better.  Watch your condition for any changes.  Take slow and deep  breaths when you feel sick to your stomach.  Keep all follow-up visits as told by your doctor. This is important. Contact a doctor if:  Your symptoms get worse.  You have new symptoms.  You have a fever.  You cannot drink fluids without throwing up.  You feel sick to your stomach for more than 2 days.  You feel light-headed or dizzy.  You have a headache.  You have muscle cramps.  You have a rash.  You have pain while peeing. Get help right away if:  You have pain in your chest, neck, arm, or jaw.  You feel very weak or you pass out (faint).  You throw up again and again.  You have throw up that is bright red or looks like black coffee grounds.  You have bloody or black poop (stools) or poop that looks like tar.  You have a very bad headache, a stiff neck, or both.  You have very bad pain, cramping, or bloating in your belly (abdomen).  You have trouble breathing.  You are breathing very quickly.  Your heart is beating very quickly.  Your skin feels cold and clammy.  You feel confused.  You have signs of losing too much water in your  body, such as: ? Dark pee, very little pee, or no pee. ? Cracked lips. ? Dry mouth. ? Sunken eyes. ? Sleepiness. ? Weakness. These symptoms may be an emergency. Do not wait to see if the symptoms will go away. Get medical help right away. Call your local emergency services (911 in the [place] Do not drive yourself to the hospital. Summary  Nausea is feeling sick to your stomach or feeling that you are about to throw up (vomit). Vomiting is when food in your stomach is thrown up and out of the mouth.  Follow instructions from your doctor about eating and drinking to keep from losing too much water in your body.  Take over-the-counter and prescription medicines only as told by your doctor.  Contact your doctor if your symptoms get worse or you have new symptoms.  Keep all follow-up visits as told by your doctor. This is important. This information is not intended to replace advice given to you by your health care provider. Make sure you discuss any questions you have with your health care provider. Document Revised: 06/07/2018 Document Reviewed: 07/24/2017 Elsevier Patient Education  2021 [employer].   Nausea and Vomiting, Adult Nausea is feeling sick to your stomach or feeling that you are about to throw up (vomit). Vomiting is when food in your stomach is thrown up and out of the mouth. Throwing up can make you feel weak. It can also make you lose too much water in your body (get dehydrated). If you lose too much water in your body, you may:  Feel tired.  Feel thirsty.  Have a dry mouth.  Have cracked lips.  Go pee (urinate) less often. Older adults and people with other diseases or a weak body defense system (immune system) are at higher risk for losing too much water in the body. If you feel sick to your stomach and you throw up, it is important to follow instructions from your doctor about how to take care of yourself. Follow these instructions at home: Watch your symptoms  for any changes. Tell your doctor about them. Follow these instructions to care for yourself at home. Eating and drinking  Take an ORS (oral rehydration solution). This is a drink that is  sold at pharmacies and stores.  Drink clear fluids in small amounts as you are able, such as: ? Water. ? Ice chips. ? Fruit juice that has water added (diluted fruit juice). ? Low-calorie sports drinks.  Eat bland, easy-to-digest foods in small amounts as you are able, such as: ? Bananas. ? Applesauce. ? Rice. ? Low-fat (lean) meats. ? Toast. ? Crackers.  Avoid drinking fluids that have a lot of sugar or caffeine in them. This includes energy drinks, sports drinks, and soda.  Avoid alcohol.  Avoid spicy or fatty foods.      General instructions  Take over-the-counter and prescription medicines only as told by your doctor.  Drink enough fluid to keep your pee (urine) pale yellow.  Wash your hands often with soap and water. If you cannot use soap and water, use hand sanitizer.  Make sure that all people in your home wash their hands well and often.  Rest at home while you get better.  Watch your condition for any changes.  Take slow and deep breaths when you feel sick to your stomach.  Keep all follow-up visits as told by your doctor. This is important. Contact a doctor if:  Your symptoms get worse.  You have new symptoms.  You have a fever.  You cannot drink fluids without throwing up.  You feel sick to your stomach for more than 2 days.  You feel light-headed or dizzy.  You have a headache.  You have muscle cramps.  You have a rash.  You have pain while peeing. Get help right away if:  You have pain in your chest, neck, arm, or jaw.  You feel very weak or you pass out (faint).  You throw up again and again.  You have throw up that is bright red or looks like black coffee grounds.  You have bloody or black poop (stools) or poop that looks like tar.  You have  a very bad headache, a stiff neck, or both.  You have very bad pain, cramping, or bloating in your belly (abdomen).  You have trouble breathing.  You are breathing very quickly.  Your heart is beating very quickly.  Your skin feels cold and clammy.  You feel confused.  You have signs of losing too much water in your body, such as: ? Dark pee, very little pee, or no pee. ? Cracked lips. ? Dry mouth. ? Sunken eyes. ? Sleepiness. ? Weakness. These symptoms may be an emergency. Do not wait to see if the symptoms will go away. Get medical help right away. Call your local emergency services (911 in the [place] Do not drive yourself to the hospital. Summary  Nausea is feeling sick to your stomach or feeling that you are about to throw up (vomit). Vomiting is when food in your stomach is thrown up and out of the mouth.  Follow instructions from your doctor about eating and drinking to keep from losing too much water in your body.  Take over-the-counter and prescription medicines only as told by your doctor.  Contact your doctor if your symptoms get worse or you have new symptoms.  Keep all follow-up visits as told by your doctor. This is important. This information is not intended to replace advice given to you by your health care provider. Make sure you discuss any questions you have with your health care provider. Document Revised: 06/07/2018 Document Reviewed: 07/24/2017 Elsevier Patient Education  2021 [employer].

## 2020-03-30 NOTE — Discharge Summary (Signed)
Physician Discharge Summary  Howard Padilla DUK:[id number] DOB: 1915/07/23 DOA: 03/28/2020  PCP: Isaac Bliss, Rayford Halsted, MD  Admit date: 03/28/2020 Discharge date: 03/30/2020  Time spent: 35 minutes  Recommendations for Outpatient Follow-up:  1. Need discussion with outpatient physician regarding prostate work-up although unlikely to have mortality benefit 2. Try to minimize meds  Discharge Diagnoses:  Principal Problem:   SBO (small bowel obstruction) (HCC) Active Problems:   HLD (hyperlipidemia)   Essential hypertension   BPH (benign prostatic hyperplasia)   CKD (chronic kidney disease) stage 3, GFR 30-59 ml/min (HCC)   Closed right hip fracture (HCC)   Discharge Condition: Improved   Soft diet for several days and then regular  Diet recommendation: As above  There were no vitals filed for this visit.  History of present illness:  Brief Narrative:  87 year old black male history of BPH HTN Admitted to the hospital with 1 episode of episodic dark emesis CT scan shows SBO with transition point markedly enlarged prostate left-sided hydroureteronephrosis and right hip fracture According to ER note is DNR  He became much more coherent alert and oriented   Assessment & Plan:   Principal Problem:   SBO (small bowel obstruction) (HCC) Active Problems:   HLD (hyperlipidemia)   Essential hypertension   BPH (benign prostatic hyperplasia)   CKD (chronic kidney disease) stage 3, GFR 30-59 ml/min (HCC)   Closed right hip fracture ([place])   1. Severe constipation a. Placed on regular MiraLAX and senna on discharge he has been cleared by general surgery for diet as we did not feel this was SBO but rather impaction of stool and he will get another smog enema prior to discharge b. Will need outpatient labs in 1 week 2. Possible prostate malignancy a. Defer to PCP discussion of the same-he is highly functional so may benefit from outpatient follow-up with urology-they saw him  this admission and did not recommend any specific treatment b. They stated that he would be a poor candidate for prostate biopsy given his frailty 3. Hypertension would consider discontinuation with aspirin and amlodipine given his advanced age and a. Poor mortality benefit   Discharge Exam: Vitals:   03/30/20 0453 03/30/20 0902  BP: (!) 136/59 123/84  Pulse: 78 78  Resp: 20 14  Temp: 98.6 F (37 C) 99.1 F (37.3 C)  SpO2: 99% 100%    General: Awake alert very pleasant EOMI NCAT Arcus senilis neck soft supple Cardiovascular: S1-S2 no murmur no rub no gallop Respiratory: Clinically clear no added sound Abdomen soft no rebound no guarding  Discharge Instructions   Discharge Instructions    Diet - low sodium heart healthy   Complete by: As directed    Discharge instructions   Complete by: As directed    Please make sure that patient takes MiraLAX daily in addition to senna on a every day to every other day basis and continue a soft diet for the next 36 hours-if he does well on a soft diet without nausea vomiting please graduated to regular diet I have stopped his aspirin and he probably does not need blood pressure medications going forward please also stop the supplements as he probably does not need these please get labs at outpatient physician's office in about 1 to 2 weeks and discuss the same with them and follow-up and have to hospital visit follow-up at his PCP office in 1 month   Increase activity slowly   Complete by: As directed      Allergies as  of 03/30/2020      Reactions   Penicillins Rash, Other (See Comments)   Has patient had a PCN reaction causing immediate rash, facial/tongue/throat swelling, SOB or lightheadedness with hypotension: No Has patient had a PCN reaction causing severe rash involving mucus membranes or skin necrosis: No Has patient had a PCN reaction that required hospitalization No Has patient had a PCN reaction occurring within the last 10 years:  No If all of the above answers are "NO", then may proceed with Cephalosporin use.      Medication List    STOP taking these medications   aspirin 81 MG chewable tablet   multivitamin with minerals Tabs tablet   vitamin C 500 MG tablet Commonly known as: ASCORBIC ACID     TAKE these medications   acetaminophen 500 MG tablet Commonly known as: TYLENOL Take 2 tablets (1,000 mg total) by mouth every 6 (six) hours as needed. What changed: reasons to take this   amLODipine 10 MG tablet Commonly known as: NORVASC TAKE 1 TABLET BY MOUTH EVERY DAY FOR BLOOD PRESSURE What changed: See the new instructions.   doxazosin 4 MG tablet Commonly known as: CARDURA TAKE 1/2 TABLET BY MOUTH DAILY   polyethylene glycol 17 g packet Commonly known as: MIRALAX / GLYCOLAX Take 17 g by mouth daily. Start taking on: March 31, 2020   senna 8.6 MG Tabs tablet Commonly known as: SENOKOT Take 1 tablet (8.6 mg total) by mouth daily.      Allergies  Allergen Reactions  . Penicillins Rash and Other (See Comments)    Has patient had a PCN reaction causing immediate rash, facial/tongue/throat swelling, SOB or lightheadedness with hypotension: No Has patient had a PCN reaction causing severe rash involving mucus membranes or skin necrosis: No Has patient had a PCN reaction that required hospitalization No Has patient had a PCN reaction occurring within the last 10 years: No If all of the above answers are "NO", then may proceed with Cephalosporin use.       The results of significant diagnostics from this hospitalization (including imaging, microbiology, ancillary and laboratory) are listed below for reference.    Significant Diagnostic Studies: CT Abdomen Pelvis W Contrast  Result Date: 03/29/2020 CLINICAL DATA:  Abdominal pain, distention, emesis EXAM: CT ABDOMEN AND PELVIS WITH CONTRAST TECHNIQUE: Multidetector CT imaging of the abdomen and pelvis was performed using the standard protocol  following bolus administration of intravenous contrast. CONTRAST:  20mL OMNIPAQUE IOHEXOL 300 MG/ML  SOLN COMPARISON:  Radiograph 03/28/2020 FINDINGS: Lower chest: Some coarsened reticular changes in ground-glass opacity noted in the lung bases. Mild pulmonary vascular congestion is noted as well. Borderline cardiomegaly. Three-vessel coronary artery atherosclerosis. No pericardial effusion. Hepatobiliary: No visible focal liver lesion. Smooth liver surface contour. Gallbladder is not well visualized. Correlate for prior cholecystectomy versus decompression. Intra and extrahepatic biliary ductal dilatation may be related to patient's advanced age/senescent changes. Pancreas: Mild pancreatic atrophy. No pancreatic ductal dilatation or surrounding inflammatory changes. Spleen: Normal in size. No concerning splenic lesions. Adrenals/Urinary Tract: Normal adrenal glands. Bilateral cortical thinning. Asymmetric severe left hydroureteronephrosis to the level of a heterogeneous mass which appears to arise from the prostate gland likely resulting in some mass effect or direct involvement of the ureter proper. No right urinary tract dilatation. No visible obstructing urolithiasis. Fluid attenuation cyst are seen bilaterally. No concerning renal mass. Stomach/Bowel: Evaluation of the bowel and mesentery limited by a marked paucity of intraperitoneal fat and lack of enteric contrast media. Distal esophagus  and stomach are unremarkable. Diffuse air and fluid-filled appearance of the small bowel with a site of possible transition in the right lower quadrant (5/79, 2/56). Colon is free of significant thickening or dilatation. Rectal stool ball measuring up to 8.5 cm in diameter, correlate for symptoms of impaction. Postsurgical changes noted about the rectosigmoid, compatible with a history of prior colectomy. Feculent material between the gluteal cleft. Vascular/Lymphatic: Extensive severe atherosclerotic plaque throughout the  abdominal aorta and branch vessels. Irregular multifocal fusiform aneurysmal dilatation throughout the abdominal aorta measuring up to 3 cm in maximal diameter. Calcified noncalcified plaque results in some fairly proximal occlusion of the right internal iliac artery and branches of the left internal iliac artery, incompletely characterized on this non angiographic technique. Evaluation of the lymph nodes is limited in a paucity of intraperitoneal fat. Several enhancing nodes may be present the deep pelvis in the region anterior to the bladder. Reproductive: Marked enlargement and heterogeneity of the prostate gland and seminal vesicles. Suspect this results in some direct mass effect or involvement of the left ureter given the upstream obstruction. Appearance is highly worrisome for prostate malignancy. Correlate with PSA and tissue sampling as appropriate. High-riding appearance of the right testis. Other: Paucity of subcutaneous and intraperitoneal fat. Diffuse body wall edema. Soft tissue thickening and stranding superficial to the sacrum, correlate with visual inspection to exclude decubitus ulceration. No discernible abdominopelvic free air or fluid. No organized abscess or collection is seen. Musculoskeletal: Diffusely mottled appearance of the osseous structures with marked bony demineralization. Multilevel discogenic and facet degenerative changes and Schmorl's node formations are present. Impacted deformity of the right subcapital femoral neck. Possibly acute with fracture line difficult to discern in the setting of demineralization. IMPRESSION: 1. Evidence of small-bowel obstruction with possible transition point in the right lower quadrant, etiology unclear. 2. Marked enlargement and heterogeneity of the prostate gland and seminal vesicles. Appearance is highly worrisome for prostate malignancy. Correlate with PSA and tissue sampling as appropriate. Several enhancing nodes may be present the deep pelvis  in the region anterior to the bladder, cannot exclude metastatic involvement. Diffusely mottled appearance of the osseous structures could reflect some osseous involvement, though finding is on a background diffuse bony demineralization. 3. Severe left hydroureteronephrosis possibly secondary to direct involvement or obstruction by the prostate lesion detailed above. Obstructive urolithiasis. Bilateral renal cortical thinning noted as well. 4. Impacted deformity of the right subcapital femoral neck. Possibly acute with fracture line difficult to discern in the setting of demineralization. Recommend correlation with point tenderness. 5. Rectal stool ball measuring up to 8.5 cm in diameter, correlate for symptoms of impaction. 6. Nonvisualization of the gallbladder, correlate for surgical history. Prominence of the biliary tree may reflect post cholecystectomy reservoir effect or senescent change. 7.  Aortic Atherosclerosis (ICD10-I70.0). 8. Calcified noncalcified atheromatous plaque results in proximal occlusion of the right internal iliac artery and more distal branches of the left internal iliac artery, incompletely characterized on this non angiographic technique. 9. Multifocal fusiform infrarenal abdominal aortic aneurysm measuring up to 3 cm in diameter. Recommend follow-up ultrasound every 3 years. This recommendation follows ACR consensus guidelines: White Paper of the ACR Incidental Findings Committee II on Vascular Findings. J Am Coll Radiol 2013; 10:789-794. 10. Likely chronic interstitial lung disease and bronchitic changes in the lung bases, incompletely characterized on this exam. Nodules seen on comparison radiography are not well visualized. Outpatient CT of the chest is recommended. These results were called by telephone at the time of interpretation on  03/29/2020 at 1:06 am to provider [facility] , who verbally acknowledged these results. Electronically Signed   By: Lovena Le M.D.   On:  03/29/2020 01:08   DG Abdomen Acute W/Chest  Result Date: 03/28/2020 CLINICAL DATA:  Emesis.  Nausea and vomiting. EXAM: DG ABDOMEN ACUTE WITH 1 VIEW CHEST COMPARISON:  Chest x-ray dated September 24, 2017. FINDINGS: There are new spiculated appearing pulmonary nodules bilaterally, the largest located in the left mid lung zone while the other is located at the right lung base. The heart size is enlarged but relatively stable from prior study. The thoracic aorta appears to be dilated. There are atherosclerotic changes of the thoracic aorta. There is no pneumothorax. There may be a small right-sided pleural effusion. No definite acute osseous abnormality. There are dilated loops of small bowel in the mid abdomen. There is no definite pneumatosis or free air. There are advanced degenerative changes of the visualized thoracolumbar spine and bilateral hips. There is diffuse osteopenia. IMPRESSION: 1. No acute cardiopulmonary process. 2. Spiculated appearing bilateral pulmonary nodules. Follow-up with an outpatient CT of the chest is recommended. 3. Dilated loops of small bowel in the mid abdomen concerning for developing small bowel obstruction or ileus. 4. Cardiomegaly.  Dilated and likely aneurysmal thoracic aorta. Electronically Signed   By: Constance Holster M.D.   On: 03/28/2020 22:18    Microbiology: Recent Results (from the past 240 hour(s))  SARS Coronavirus 2 by RT PCR (hospital order, performed in [facility] hospital lab) Nasopharyngeal Nasopharyngeal Swab     Status: None   Collection Time: 03/28/20 10:32 PM   Specimen: Nasopharyngeal Swab  Result Value Ref Range Status   SARS Coronavirus 2 NEGATIVE NEGATIVE Final    Comment: (NOTE) SARS-CoV-2 target nucleic acids are NOT DETECTED.  The SARS-CoV-2 RNA is generally detectable in upper and lower respiratory specimens during the acute phase of infection. The lowest concentration of SARS-CoV-2 viral copies this assay can detect is 250 copies / mL.  A negative result does not preclude SARS-CoV-2 infection and should not be used as the sole basis for treatment or other patient management decisions.  A negative result may occur with improper specimen collection / handling, submission of specimen other than nasopharyngeal swab, presence of viral mutation(s) within the areas targeted by this assay, and inadequate number of viral copies (<250 copies / mL). A negative result must be combined with clinical observations, patient history, and epidemiological information.  Fact Sheet for Patients:   StrictlyIdeas.no  Fact Sheet for Healthcare Providers: BankingDealers.co.za  This test is not yet approved or  cleared by the [place] FDA and has been authorized for detection and/or diagnosis of SARS-CoV-2 by FDA under an Emergency Use Authorization (EUA).  This EUA will remain in effect (meaning this test can be used) for the duration of the COVID-19 declaration under Section 564(b)(1) of the Act, 21 U.S.C. section 360bbb-3(b)(1), unless the authorization is terminated or revoked sooner.  Performed at [facility], [place] [address] [place], [place] [address]      Labs: Basic Metabolic Panel: Recent Labs  Lab 03/28/20 2150 03/30/20 0914  NA 146* 143  K 5.1 3.9  CL 108 111  CO2 26 23  GLUCOSE 142* 106*  BUN 38* 30*  CREATININE 1.75* 1.38*  CALCIUM 9.4 8.9   Liver Function Tests: Recent Labs  Lab 03/28/20 2150 03/30/20 0914  AST 20 22  ALT 17 16  ALKPHOS 92 82  BILITOT 0.6 0.8  PROT  6.6 6.0*  ALBUMIN 3.3* 3.0*   Recent Labs  Lab 03/28/20 2150  LIPASE 22   No results for input(s): AMMONIA in the last 168 hours. CBC: Recent Labs  Lab 03/28/20 2150 03/30/20 0914  WBC 5.1 6.0  NEUTROABS 4.3 4.8  HGB 11.0* 10.1*  HCT 34.1* 31.1*  MCV 98.8 100.0  PLT 249 183   Cardiac Enzymes: No results for input(s): CKTOTAL, CKMB, CKMBINDEX, TROPONINI in  the last 168 hours. BNP: BNP (last 3 results) No results for input(s): BNP in the last 8760 hours.  ProBNP (last 3 results) No results for input(s): PROBNP in the last 8760 hours.  CBG: Recent Labs  Lab 03/29/20 1149 03/29/20 1740 03/30/20 0010 03/30/20 0455 03/30/20 1115  GLUCAP 119* 118* 130* 96 101*       Signed:  Nita Sells MD   [facility] 03/30/2020, 1:01 PM

## 2020-03-30 NOTE — Progress Notes (Signed)
[facility] Progress Note     Subjective: CC-  No complaints this morning. Tolerating clear liquids. He drank some milk as well. No n/v. Good results with enema.  Objective: Vital signs in last 24 hours: Temp:  [97.9 F (36.6 C)-99.1 F (37.3 C)] 99.1 F (37.3 C) (02/01 0902) Pulse Rate:  [78-107] 78 (02/01 0902) Resp:  [14-20] 14 (02/01 0902) BP: (106-145)/(59-85) 123/84 (02/01 0902) SpO2:  [92 %-100 %] 100 % (02/01 0902) Last BM Date: 03/29/20  Intake/Output from previous day: 01/31 0701 - 02/01 0700 In: 1240 [P.O.:240; IV Piggyback:1000] Out: 250 [Urine:250] Intake/Output this shift: No intake/output data recorded.  PE: Gen:  Alert, NAD, pleasant Pulm: rate and effort normal Abd: Soft, NT/ND, +BS, no HSM Skin: warm and dry  Lab Results:  Recent Labs    03/28/20 2150 03/30/20 0914  WBC 5.1 6.0  HGB 11.0* 10.1*  HCT 34.1* 31.1*  PLT 249 183   BMET Recent Labs    03/28/20 2150  NA 146*  K 5.1  CL 108  CO2 26  GLUCOSE 142*  BUN 38*  CREATININE 1.75*  CALCIUM 9.4   PT/INR No results for input(s): LABPROT, INR in the last 72 hours. CMP     Component Value Date/Time   NA 146 (H) 03/28/2020 2150   K 5.1 03/28/2020 2150   CL 108 03/28/2020 2150   CO2 26 03/28/2020 2150   GLUCOSE 142 (H) 03/28/2020 2150   BUN 38 (H) 03/28/2020 2150   CREATININE 1.75 (H) 03/28/2020 2150   CALCIUM 9.4 03/28/2020 2150   PROT 6.6 03/28/2020 2150   ALBUMIN 3.3 (L) 03/28/2020 2150   AST 20 03/28/2020 2150   ALT 17 03/28/2020 2150   ALKPHOS 92 03/28/2020 2150   BILITOT 0.6 03/28/2020 2150   GFRNONAA 34 (L) 03/28/2020 2150   GFRAA 45 (L) 09/25/2017 0620   Lipase     Component Value Date/Time   LIPASE 22 03/28/2020 2150       Studies/Results: CT Abdomen Pelvis W Contrast  Result Date: 03/29/2020 CLINICAL DATA:  Abdominal pain, distention, emesis EXAM: CT ABDOMEN AND PELVIS WITH CONTRAST TECHNIQUE: Multidetector CT imaging of the abdomen and pelvis  was performed using the standard protocol following bolus administration of intravenous contrast. CONTRAST:  51mL OMNIPAQUE IOHEXOL 300 MG/ML  SOLN COMPARISON:  Radiograph 03/28/2020 FINDINGS: Lower chest: Some coarsened reticular changes in ground-glass opacity noted in the lung bases. Mild pulmonary vascular congestion is noted as well. Borderline cardiomegaly. Three-vessel coronary artery atherosclerosis. No pericardial effusion. Hepatobiliary: No visible focal liver lesion. Smooth liver surface contour. Gallbladder is not well visualized. Correlate for prior cholecystectomy versus decompression. Intra and extrahepatic biliary ductal dilatation may be related to patient's advanced age/senescent changes. Pancreas: Mild pancreatic atrophy. No pancreatic ductal dilatation or surrounding inflammatory changes. Spleen: Normal in size. No concerning splenic lesions. Adrenals/Urinary Tract: Normal adrenal glands. Bilateral cortical thinning. Asymmetric severe left hydroureteronephrosis to the level of a heterogeneous mass which appears to arise from the prostate gland likely resulting in some mass effect or direct involvement of the ureter proper. No right urinary tract dilatation. No visible obstructing urolithiasis. Fluid attenuation cyst are seen bilaterally. No concerning renal mass. Stomach/Bowel: Evaluation of the bowel and mesentery limited by a marked paucity of intraperitoneal fat and lack of enteric contrast media. Distal esophagus and stomach are unremarkable. Diffuse air and fluid-filled appearance of the small bowel with a site of possible transition in the right lower quadrant (5/79, 2/56). Colon is free of  significant thickening or dilatation. Rectal stool ball measuring up to 8.5 cm in diameter, correlate for symptoms of impaction. Postsurgical changes noted about the rectosigmoid, compatible with a history of prior colectomy. Feculent material between the gluteal cleft. Vascular/Lymphatic: Extensive  severe atherosclerotic plaque throughout the abdominal aorta and branch vessels. Irregular multifocal fusiform aneurysmal dilatation throughout the abdominal aorta measuring up to 3 cm in maximal diameter. Calcified noncalcified plaque results in some fairly proximal occlusion of the right internal iliac artery and branches of the left internal iliac artery, incompletely characterized on this non angiographic technique. Evaluation of the lymph nodes is limited in a paucity of intraperitoneal fat. Several enhancing nodes may be present the deep pelvis in the region anterior to the bladder. Reproductive: Marked enlargement and heterogeneity of the prostate gland and seminal vesicles. Suspect this results in some direct mass effect or involvement of the left ureter given the upstream obstruction. Appearance is highly worrisome for prostate malignancy. Correlate with PSA and tissue sampling as appropriate. High-riding appearance of the right testis. Other: Paucity of subcutaneous and intraperitoneal fat. Diffuse body wall edema. Soft tissue thickening and stranding superficial to the sacrum, correlate with visual inspection to exclude decubitus ulceration. No discernible abdominopelvic free air or fluid. No organized abscess or collection is seen. Musculoskeletal: Diffusely mottled appearance of the osseous structures with marked bony demineralization. Multilevel discogenic and facet degenerative changes and Schmorl's node formations are present. Impacted deformity of the right subcapital femoral neck. Possibly acute with fracture line difficult to discern in the setting of demineralization. IMPRESSION: 1. Evidence of small-bowel obstruction with possible transition point in the right lower quadrant, etiology unclear. 2. Marked enlargement and heterogeneity of the prostate gland and seminal vesicles. Appearance is highly worrisome for prostate malignancy. Correlate with PSA and tissue sampling as appropriate. Several  enhancing nodes may be present the deep pelvis in the region anterior to the bladder, cannot exclude metastatic involvement. Diffusely mottled appearance of the osseous structures could reflect some osseous involvement, though finding is on a background diffuse bony demineralization. 3. Severe left hydroureteronephrosis possibly secondary to direct involvement or obstruction by the prostate lesion detailed above. Obstructive urolithiasis. Bilateral renal cortical thinning noted as well. 4. Impacted deformity of the right subcapital femoral neck. Possibly acute with fracture line difficult to discern in the setting of demineralization. Recommend correlation with point tenderness. 5. Rectal stool ball measuring up to 8.5 cm in diameter, correlate for symptoms of impaction. 6. Nonvisualization of the gallbladder, correlate for surgical history. Prominence of the biliary tree may reflect post cholecystectomy reservoir effect or senescent change. 7.  Aortic Atherosclerosis (ICD10-I70.0). 8. Calcified noncalcified atheromatous plaque results in proximal occlusion of the right internal iliac artery and more distal branches of the left internal iliac artery, incompletely characterized on this non angiographic technique. 9. Multifocal fusiform infrarenal abdominal aortic aneurysm measuring up to 3 cm in diameter. Recommend follow-up ultrasound every 3 years. This recommendation follows ACR consensus guidelines: White Paper of the ACR Incidental Findings Committee II on Vascular Findings. J Am Coll Radiol 2013; 10:789-794. 10. Likely chronic interstitial lung disease and bronchitic changes in the lung bases, incompletely characterized on this exam. Nodules seen on comparison radiography are not well visualized. Outpatient CT of the chest is recommended. These results were called by telephone at the time of interpretation on 03/29/2020 at 1:06 am to provider [facility] , who verbally acknowledged these results.  Electronically Signed   By: Lovena Le M.D.   On: 03/29/2020 01:08  DG Abdomen Acute W/Chest  Result Date: 03/28/2020 CLINICAL DATA:  Emesis.  Nausea and vomiting. EXAM: DG ABDOMEN ACUTE WITH 1 VIEW CHEST COMPARISON:  Chest x-ray dated September 24, 2017. FINDINGS: There are new spiculated appearing pulmonary nodules bilaterally, the largest located in the left mid lung zone while the other is located at the right lung base. The heart size is enlarged but relatively stable from prior study. The thoracic aorta appears to be dilated. There are atherosclerotic changes of the thoracic aorta. There is no pneumothorax. There may be a small right-sided pleural effusion. No definite acute osseous abnormality. There are dilated loops of small bowel in the mid abdomen. There is no definite pneumatosis or free air. There are advanced degenerative changes of the visualized thoracolumbar spine and bilateral hips. There is diffuse osteopenia. IMPRESSION: 1. No acute cardiopulmonary process. 2. Spiculated appearing bilateral pulmonary nodules. Follow-up with an outpatient CT of the chest is recommended. 3. Dilated loops of small bowel in the mid abdomen concerning for developing small bowel obstruction or ileus. 4. Cardiomegaly.  Dilated and likely aneurysmal thoracic aorta. Electronically Signed   By: Constance Holster M.D.   On: 03/28/2020 22:18    Anti-infectives: Anti-infectives (From admission, onward)   None       Assessment/Plan HTN HLD H/O DVT with PE Dementia - currently confused, unclear if this is his baseline Enlarged prostate with severe L hydroureteronephrosis - per medicine Possible hip fx vs demineralization - per medicine  SBO/constipation - No further n/v and patient is tolerating clear liquids. Abdomen is soft, nondistended, and nontender. Low suspicion for SBO. Good results with enema yesterday. Will repeat today. Advance to full liquids, ok to advance diet as tolerated. Add oral bowel  regimen.  We will sign off, please call with concerns.  FEN - FLD, Boost VTE - ok for chemical prophylaxis from our standpoint ID - none currently   LOS: 1 day    Wellington Hampshire, [facility] 03/30/2020, 9:39 AM Please see Amion for pager number during day hours 7:00am-4:30pm

## 2020-03-30 NOTE — TOC Initial Note (Signed)
Transition of Care [facility]) - Initial/Assessment Note    Patient Details  Name: Howard Padilla MRN: [id number] Date of Birth: 01-13-1916  Transition of [facility]) CM/SW Contact:    Trish Mage, LCSW Phone Number: 03/30/2020, 4:04 PM  Clinical Narrative:   Patient who is stable for discharge is in need of [place] PT, DME RW.  Spoke to wife who confirmed ability to care for patient with help of son who lives in home and daughter close by.  She is interested in [facility] PT, no agency preference, and DME RW as well.  She also asks that we send him home on PTAR as they have steps going into the home. Ramond Marrow with [facility] accepted for [facility] services, [facility] will provide delivery of walker, and PTAR was arranged.  No further needs identified.  TOC sign off.                Expected Discharge Plan: [place] Barriers to Discharge: No Barriers Identified   Patient Goals and CMS Choice     Choice offered to / list presented to : Spouse  Expected Discharge Plan and Services Expected Discharge Plan: [place]   Discharge Planning Services: CM Consult Post Acute Care Choice: [place] Living arrangements for the past 2 months: Single Family Home Expected Discharge Date: 03/30/20                                    Prior Living Arrangements/Services Living arrangements for the past 2 months: Single Family Home Lives with:: [place] Patient language and need for interpreter reviewed:: Yes Do you feel safe going back to the place where you live?: Yes      Need for Family Participation in Patient Care: Yes (Comment) Care giver support system in place?: Yes (comment)   Criminal Activity/Legal Involvement Pertinent to Current Situation/Hospitalization: No - Comment as needed  Activities of Daily Living Home Assistive Devices/Equipment: Eyeglasses,Cane (specify quad or straight),Other (Comment),Shower chair without  back (tub/shower unit, single point cane) ADL Screening (condition at time of admission) Patient's cognitive ability adequate to safely complete daily activities?: Yes Is the patient deaf or have difficulty hearing?: Yes Does the patient have difficulty seeing, even when wearing glasses/contacts?: Yes Does the patient have difficulty concentrating, remembering, or making decisions?: Yes Patient able to express need for assistance with ADLs?: Yes Does the patient have difficulty dressing or bathing?: Yes Independently performs ADLs?: No Communication: Independent Dressing (OT): Needs assistance Is this a change from baseline?: Pre-admission baseline Grooming: Needs assistance Is this a change from baseline?: Pre-admission baseline Feeding: Needs assistance Is this a change from baseline?: Pre-admission baseline Bathing: Needs assistance Is this a change from baseline?: Pre-admission baseline Toileting: Needs assistance Is this a change from baseline?: Pre-admission baseline In/Out Bed: Needs assistance Is this a change from baseline?: Pre-admission baseline Walks in Home: Needs assistance Is this a change from baseline?: Pre-admission baseline Does the patient have difficulty walking or climbing stairs?: Yes (secondary to weakness) Weakness of Legs: Both Weakness of Arms/Hands: None  Permission Sought/Granted Permission sought to share information with : Family Supports Permission granted to share information with : Yes, Verbal Permission Granted  Share Information with NAME: Miraj, Truss (Spouse)   [phone]           Emotional Assessment       Orientation: : Oriented to Self  Alcohol / Substance Use: Not Applicable Psych Involvement: No (comment)  Admission diagnosis:  Hydroureteronephrosis [N13.30] SBO (small bowel obstruction) (HCC) [K56.609] Vomiting without nausea, intractability of vomiting not specified, unspecified vomiting type [R11.11] Other specified  intestinal obstruction, unspecified whether partial or complete [facility]) [K56.699] Patient Active Problem List   Diagnosis Date Noted  . SBO (small bowel obstruction) ([place]) 03/29/2020  . Closed right hip fracture ([place]) 03/29/2020  . Near syncope 09/24/2017  . Acute kidney injury superimposed on chronic kidney disease ([place]) 09/24/2017  . Postural dizziness with presyncope 08/21/2017  . Bradycardia 07/14/2016  . Aphasia 01/06/2015  . Stroke ([place]) 01/06/2015  . Slurred speech   . TIA (transient ischemic attack)   . Urinary retention 12/25/2014  . Lung nodule 06/24/2013  . CAP (community acquired pneumonia) 06/23/2013  . 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 Lung abscess ([place]) 06/22/2013  . CKD (chronic kidney disease) stage 3, GFR 30-59 ml/min (HCC) 06/22/2013  . OTHER ABNORMAL BLOOD CHEMISTRY 02/03/2010  . PERSONAL HISTORY OF THROMBOPHLEBITIS 02/03/2010  . WEIGHT LOSS 10/29/2008  . DVT 06/24/2007  . LEG PAIN, LEFT 05/28/2007  . SYNCOPE 05/28/2007  . ACUTE BRONCHITIS 05/25/2007  . Anemia of renal disease 11/05/2006  . HLD (hyperlipidemia) 07/30/2006  . Essential hypertension 07/30/2006  . BPH (benign prostatic hyperplasia) 07/30/2006  . History of malignant neoplasm of large intestine 07/30/2006   PCP:  Isaac Bliss, Rayford Halsted, MD Pharmacy:   [id number] - [place], [place] [address] Phone: [phone] Fax: [phone]     Social Determinants of Health (SDOH) Interventions    Readmission Risk Interventions No flowsheet data found.

## 2020-03-30 NOTE — Evaluation (Addendum)
Physical Therapy Evaluation Patient Details Name: Howard Padilla MRN: [id number] DOB: 04/23/1915 Today's Date: 03/30/2020   History of Present Illness     Clinical Impression  Pt is a 87y.o. male presenting to [facility] for emesis and admitted for suspicion of small bowel obstruction. Pt has history of dementia and is a poor historian, home living environment and PLOF obtained from pt's daughter and wife. Per wife, pt is unsteady during mobility at baseline. Pt required MIN assist for safety and verbal and tactile cues for sit to stand transfers. Pt required MOD assist for ambulation 52ft with verbal and tactile cues for RW management and upright posture. Pt was unsteady and demonstrated irradic path, shuffling, and significantly flexed trunk during gait. Pt lives with his wife and son who will assist at home. Recommend HHPT with 24/7 supervision/assist from family. Pt will benefit from skilled PT to increase independence and safety with mobility. Acute therapy to follow up during stay.        03/30/20 1100  PT Visit Information  Last PT Received On 03/30/20  Assistance Needed +1  History of Present Illness Patient is 87 y.o. male with PMH significant for HTN, HLD, DVT, dementia, hx of colon cancer s/p partial colectomy, and BPH.  Precautions  Precautions Fall  Restrictions  Weight Bearing Restrictions No  Home Living  Family/patient expects to be discharged to: Private residence  Living Arrangements Spouse/significant other  Available Help at Discharge Family  Type of [place] to enter  Entrance Stairs-Number of Steps 1  [place] One level  Bathroom Biomedical scientist Yes  Home Equipment Walker - 4 wheels  Additional Comments pt has dementia and is poor historian, PLOF and home enviroment information obtained from pt's wife and daughter. Pt's wife reports that he only walks  around in the home to go to the bathroom and to kitchen for dinner, otherwise sits in his chair throughout the day.  Prior Function  Level of Independence Needs assistance  Gait / Transfers Assistance Needed per pt's wife HHA when ambulating around the house  ADL's / Homemaking Assistance Needed Pt completes toileting independently with supervision from spouse  Communication  Communication Other (comment) (dementia, confused)  Pain Assessment  Pain Assessment Faces  Faces Pain Scale 4  Pain Location Lt arm IV site - edema noted  Pain Descriptors / Indicators Grimacing  Pain Intervention(s) Monitored during session (RN, rachel removed IV and wrapped IV site)  Cognition  Arousal/Alertness Awake/alert  Behavior During Therapy WFL for tasks assessed/performed (pt very pleasant)  Overall Cognitive Status History of cognitive impairments - at baseline  Area of Impairment Orientation;Following commands;Safety/judgement;Problem solving  Orientation Level Disoriented to;Place;Time;Situation  Following Commands Follows one step commands inconsistently;Follows one step commands with increased time  Safety/Judgement Decreased awareness of safety;Decreased awareness of deficits  Problem Solving Decreased initiation;Difficulty sequencing;Requires verbal cues  General Comments pt able to verbalize that his name is "charlie" when asked, disoriented to time and place. No family present to confirm basleine cognition deficits, pt has history of dementia.  Upper Extremity Assessment  Upper Extremity Assessment Generalized weakness  Lower Extremity Assessment  Lower Extremity Assessment Generalized weakness  Cervical / Trunk Assessment  Cervical / Trunk Assessment Kyphotic  Bed Mobility  Overal bed mobility Needs Assistance  Bed Mobility Supine to Sit  Supine to sit Supervision  General bed mobility comments pt able to progress from supine to sit with  verbal and tactile cues for sequencing  Transfers   Overall transfer level Needs assistance  Equipment used Rolling walker (2 wheeled)  Transfers Sit to/from Stand  Sit to Stand Min assist;+2 safety/equipment  General transfer comment pt with MIN assist and +2 for safety with steadying upon inital standing and verbal and tactile cues for safe hand placement on RW.  Ambulation/Gait  Ambulation/Gait assistance Mod assist  Gait Distance (Feet) 20 Feet  Assistive device Rolling walker (2 wheeled)  Gait Pattern/deviations Step-to pattern;Shuffle;Decreased stride length  General Gait Details pt very unsteady and with flexed trunk, irradic path, shuffling steps, and increased proximity from RW during gait. Pt required MOD assist for safety and RW management with tactile/verbal cues for upright posture and safe hand placement on RW. Given pt's cognition, RW may have impaired gait further, will attempt HHA next session.  Gait velocity decr  Balance  Overall balance assessment Needs assistance  Sitting-balance support Feet supported  Sitting balance-Leahy Scale Good  Standing balance support Bilateral upper extremity supported  Standing balance-Leahy Scale Poor  Standing balance comment use of external support  General Comments  General comments (skin integrity, edema, etc.) Lt arm and hand edematous and with errythema at IV site,  RN alerted and removed IV. Pt noted to have skin tear and dressed site.  PT - End of Session  Equipment Utilized During Treatment Gait belt  Activity Tolerance Patient tolerated treatment well  Patient left in chair;with call bell/phone within reach;with chair alarm set (with posey belt)  Nurse Communication Mobility status  PT Assessment  PT Recommendation/Assessment Patient needs continued PT services  PT Visit Diagnosis Unsteadiness on feet (R26.81);Muscle weakness (generalized) (M62.81);Other abnormalities of gait and mobility (R26.89)  PT Problem List Decreased strength;Decreased balance;Decreased  mobility;Decreased cognition;Decreased knowledge of use of DME;Decreased safety awareness;Decreased activity tolerance  PT Plan  PT Frequency (ACUTE ONLY) Min 3X/week  PT Treatment/Interventions (ACUTE ONLY) DME instruction;Gait training;Functional mobility training;Therapeutic activities;Therapeutic exercise;Balance training;Patient/family education  AM-PAC PT "6 Clicks" Mobility Outcome Measure (Version 2)  Help needed turning from your back to your side while in a flat bed without using bedrails? 4  Help needed moving from lying on your back to sitting on the side of a flat bed without using bedrails? 3  Help needed moving to and from a bed to a chair (including a wheelchair)? 3  Help needed standing up from a chair using your arms (e.g., wheelchair or bedside chair)? 3  Help needed to walk in hospital room? 2  Help needed climbing 3-5 steps with a railing?  2  6 Click Score 17  Consider Recommendation of Discharge To: Home with [facility]  PT Recommendation  Follow Up Recommendations Home health PT  PT equipment None recommended by PT  Individuals Consulted  Consulted and Agree with Results and Recommendations Family member/caregiver;Patient  Family Member Consulted pt's wife and daughter  Acute Rehab PT Goals  Patient Stated Goal none stated  PT Goal Formulation With patient  Time For Goal Achievement 04/13/20  Potential to Achieve Goals Fair  PT Time Calculation  PT Start Time (ACUTE ONLY) 1136  PT Stop Time (ACUTE ONLY) 1216 (8 min not billable- RN addressing IV)  PT Time Calculation (min) (ACUTE ONLY) 40 min     Joan Avetisyan, SPT  Acute rehab    Howard Padilla 03/30/2020, 3:08 PM

## 2020-03-30 NOTE — Progress Notes (Addendum)
MD order to discharge.  Discharge instructions placed in EMS packet.  Wife [name] aware of discharge and instructions in packet.   Will discharge home via [place].

## 2020-03-31 DIAGNOSIS — N1330 Unspecified hydronephrosis: Secondary | ICD-10-CM | POA: Diagnosis not present

## 2020-03-31 DIAGNOSIS — K5900 Constipation, unspecified: Secondary | ICD-10-CM | POA: Diagnosis not present

## 2020-03-31 DIAGNOSIS — Z86711 Personal history of pulmonary embolism: Secondary | ICD-10-CM | POA: Diagnosis not present

## 2020-03-31 DIAGNOSIS — I129 Hypertensive chronic kidney disease with stage 1 through stage 4 chronic kidney disease, or unspecified chronic kidney disease: Secondary | ICD-10-CM | POA: Diagnosis not present

## 2020-03-31 DIAGNOSIS — Z9181 History of falling: Secondary | ICD-10-CM | POA: Diagnosis not present

## 2020-03-31 DIAGNOSIS — N401 Enlarged prostate with lower urinary tract symptoms: Secondary | ICD-10-CM | POA: Diagnosis not present

## 2020-03-31 DIAGNOSIS — Z86718 Personal history of other venous thrombosis and embolism: Secondary | ICD-10-CM | POA: Diagnosis not present

## 2020-03-31 DIAGNOSIS — N183 Chronic kidney disease, stage 3 unspecified: Secondary | ICD-10-CM | POA: Diagnosis not present

## 2020-03-31 DIAGNOSIS — Z87891 Personal history of nicotine dependence: Secondary | ICD-10-CM | POA: Diagnosis not present

## 2020-03-31 DIAGNOSIS — E785 Hyperlipidemia, unspecified: Secondary | ICD-10-CM | POA: Diagnosis not present

## 2020-03-31 DIAGNOSIS — N138 Other obstructive and reflux uropathy: Secondary | ICD-10-CM | POA: Diagnosis not present

## 2020-03-31 DIAGNOSIS — K56699 Other intestinal obstruction unspecified as to partial versus complete obstruction: Secondary | ICD-10-CM | POA: Diagnosis not present

## 2020-03-31 DIAGNOSIS — S72001D Fracture of unspecified part of neck of right femur, subsequent encounter for closed fracture with routine healing: Secondary | ICD-10-CM | POA: Diagnosis not present

## 2020-03-31 DIAGNOSIS — D631 Anemia in chronic kidney disease: Secondary | ICD-10-CM | POA: Diagnosis not present

## 2020-03-31 DIAGNOSIS — Z85038 Personal history of other malignant neoplasm of large intestine: Secondary | ICD-10-CM | POA: Diagnosis not present

## 2020-03-31 DIAGNOSIS — Z9049 Acquired absence of other specified parts of digestive tract: Secondary | ICD-10-CM | POA: Diagnosis not present

## 2020-03-31 DIAGNOSIS — F0390 Unspecified dementia without behavioral disturbance: Secondary | ICD-10-CM | POA: Diagnosis not present

## 2020-04-01 ENCOUNTER — Telehealth: Payer: Self-pay | Admitting: Internal Medicine

## 2020-04-01 NOTE — Telephone Encounter (Signed)
Ok to order as requested 

## 2020-04-01 NOTE — Telephone Encounter (Signed)
Transition Care Management Follow-up Telephone Call  Date of discharge and from where: 03/30/2020 from [place]   How have you been since you were released from the hospital? Patient is doing well. Still on soft diet.   Any questions or concerns? No  Items Reviewed:  Did the pt receive and understand the discharge instructions provided? Yes   Medications obtained and verified? Yes   Other? No   Any new allergies since your discharge? No   Dietary orders reviewed? Yes  Do you have support at home? Yes   Home Care and Equipment/Supplies: Were home health services ordered? yes If so, what is the name of the agency? [facility]  Has the agency set up a time to come to the patient's home? yes Were any new equipment or medical supplies ordered?  Yes: a walker What is the name of the medical supply agency? [facility]  Were you able to get the supplies/equipment? yes Do you have any questions related to the use of the equipment or supplies? No  Functional Questionnaire: (I = Independent and D = Dependent) ADLs: D  Bathing/Dressing- D   Meal Prep- D   Eating- I  Maintaining continence- D  Transferring/Ambulation- I with walker  Managing Meds- D  Follow up appointments reviewed:   PCP Hospital f/u appt confirmed? No  Patient wife would like to hold off on scheduling at this time   [facility] f/u appt confirmed? No    Are transportation arrangements needed? No   If their condition worsens, is the pt aware to call PCP or go to the Emergency Dept.? Yes  Was the patient provided with contact information for the PCP's office or ED? Yes  Was to pt encouraged to call back with questions or concerns? Yes

## 2020-04-01 NOTE — Telephone Encounter (Signed)
Cecilia w/[facility] is calling in to get verbal orders for PT 1 week 1  2 week 4  1 week 4 beginning on Wednesday 03/31/2020.  May leave a detail msg on her secured voice mail if no answer.

## 2020-04-01 NOTE — Telephone Encounter (Signed)
Spoke to [place], verbal orders given

## 2020-04-08 ENCOUNTER — Other Ambulatory Visit: Payer: Self-pay | Admitting: Internal Medicine

## 2020-04-09 DIAGNOSIS — N138 Other obstructive and reflux uropathy: Secondary | ICD-10-CM | POA: Diagnosis not present

## 2020-04-09 DIAGNOSIS — N401 Enlarged prostate with lower urinary tract symptoms: Secondary | ICD-10-CM | POA: Diagnosis not present

## 2020-04-09 DIAGNOSIS — N1330 Unspecified hydronephrosis: Secondary | ICD-10-CM | POA: Diagnosis not present

## 2020-04-09 DIAGNOSIS — K56699 Other intestinal obstruction unspecified as to partial versus complete obstruction: Secondary | ICD-10-CM | POA: Diagnosis not present

## 2020-04-09 DIAGNOSIS — I129 Hypertensive chronic kidney disease with stage 1 through stage 4 chronic kidney disease, or unspecified chronic kidney disease: Secondary | ICD-10-CM | POA: Diagnosis not present

## 2020-04-09 DIAGNOSIS — S72001D Fracture of unspecified part of neck of right femur, subsequent encounter for closed fracture with routine healing: Secondary | ICD-10-CM | POA: Diagnosis not present

## 2020-04-15 DIAGNOSIS — K56699 Other intestinal obstruction unspecified as to partial versus complete obstruction: Secondary | ICD-10-CM | POA: Diagnosis not present

## 2020-04-15 DIAGNOSIS — N1330 Unspecified hydronephrosis: Secondary | ICD-10-CM | POA: Diagnosis not present

## 2020-04-15 DIAGNOSIS — I129 Hypertensive chronic kidney disease with stage 1 through stage 4 chronic kidney disease, or unspecified chronic kidney disease: Secondary | ICD-10-CM | POA: Diagnosis not present

## 2020-04-15 DIAGNOSIS — N138 Other obstructive and reflux uropathy: Secondary | ICD-10-CM | POA: Diagnosis not present

## 2020-04-15 DIAGNOSIS — S72001D Fracture of unspecified part of neck of right femur, subsequent encounter for closed fracture with routine healing: Secondary | ICD-10-CM | POA: Diagnosis not present

## 2020-04-15 DIAGNOSIS — N401 Enlarged prostate with lower urinary tract symptoms: Secondary | ICD-10-CM | POA: Diagnosis not present

## 2020-04-16 ENCOUNTER — Telehealth: Payer: Self-pay | Admitting: Internal Medicine

## 2020-04-16 NOTE — Telephone Encounter (Signed)
[facility] called to let Dr. Jerilee Hoh know that the patient canceled the appointment for today because he had family coming in town today and he was seen yesterday.  FYI

## 2020-04-17 ENCOUNTER — Emergency Department (HOSPITAL_COMMUNITY): Payer: Medicare Other

## 2020-04-17 ENCOUNTER — Other Ambulatory Visit: Payer: Self-pay

## 2020-04-17 ENCOUNTER — Inpatient Hospital Stay (HOSPITAL_COMMUNITY)
Admission: EM | Admit: 2020-04-17 | Discharge: 2020-04-27 | DRG: 871 | Disposition: E | Payer: Medicare Other | Attending: Family Medicine | Admitting: Family Medicine

## 2020-04-17 ENCOUNTER — Encounter (HOSPITAL_COMMUNITY): Payer: Self-pay | Admitting: *Deleted

## 2020-04-17 DIAGNOSIS — A419 Sepsis, unspecified organism: Principal | ICD-10-CM | POA: Diagnosis present

## 2020-04-17 DIAGNOSIS — R404 Transient alteration of awareness: Secondary | ICD-10-CM | POA: Diagnosis not present

## 2020-04-17 DIAGNOSIS — Z66 Do not resuscitate: Secondary | ICD-10-CM | POA: Diagnosis present

## 2020-04-17 DIAGNOSIS — Z20822 Contact with and (suspected) exposure to covid-19: Secondary | ICD-10-CM | POA: Diagnosis not present

## 2020-04-17 DIAGNOSIS — N400 Enlarged prostate without lower urinary tract symptoms: Secondary | ICD-10-CM | POA: Diagnosis present

## 2020-04-17 DIAGNOSIS — Z87891 Personal history of nicotine dependence: Secondary | ICD-10-CM

## 2020-04-17 DIAGNOSIS — N170 Acute kidney failure with tubular necrosis: Secondary | ICD-10-CM | POA: Diagnosis not present

## 2020-04-17 DIAGNOSIS — J690 Pneumonitis due to inhalation of food and vomit: Secondary | ICD-10-CM | POA: Diagnosis not present

## 2020-04-17 DIAGNOSIS — G9341 Metabolic encephalopathy: Secondary | ICD-10-CM | POA: Diagnosis not present

## 2020-04-17 DIAGNOSIS — Z8673 Personal history of transient ischemic attack (TIA), and cerebral infarction without residual deficits: Secondary | ICD-10-CM

## 2020-04-17 DIAGNOSIS — D631 Anemia in chronic kidney disease: Secondary | ICD-10-CM | POA: Diagnosis present

## 2020-04-17 DIAGNOSIS — C61 Malignant neoplasm of prostate: Secondary | ICD-10-CM | POA: Diagnosis present

## 2020-04-17 DIAGNOSIS — H9190 Unspecified hearing loss, unspecified ear: Secondary | ICD-10-CM | POA: Diagnosis present

## 2020-04-17 DIAGNOSIS — F0390 Unspecified dementia without behavioral disturbance: Secondary | ICD-10-CM | POA: Diagnosis present

## 2020-04-17 DIAGNOSIS — Z880 Allergy status to penicillin: Secondary | ICD-10-CM

## 2020-04-17 DIAGNOSIS — R1310 Dysphagia, unspecified: Secondary | ICD-10-CM | POA: Diagnosis present

## 2020-04-17 DIAGNOSIS — R627 Adult failure to thrive: Secondary | ICD-10-CM | POA: Diagnosis present

## 2020-04-17 DIAGNOSIS — Z681 Body mass index (BMI) 19 or less, adult: Secondary | ICD-10-CM

## 2020-04-17 DIAGNOSIS — E785 Hyperlipidemia, unspecified: Secondary | ICD-10-CM | POA: Diagnosis present

## 2020-04-17 DIAGNOSIS — Z79899 Other long term (current) drug therapy: Secondary | ICD-10-CM

## 2020-04-17 DIAGNOSIS — J189 Pneumonia, unspecified organism: Secondary | ICD-10-CM | POA: Diagnosis not present

## 2020-04-17 DIAGNOSIS — I129 Hypertensive chronic kidney disease with stage 1 through stage 4 chronic kidney disease, or unspecified chronic kidney disease: Secondary | ICD-10-CM | POA: Diagnosis present

## 2020-04-17 DIAGNOSIS — Z85038 Personal history of other malignant neoplasm of large intestine: Secondary | ICD-10-CM

## 2020-04-17 DIAGNOSIS — G928 Other toxic encephalopathy: Secondary | ICD-10-CM | POA: Diagnosis not present

## 2020-04-17 DIAGNOSIS — R5381 Other malaise: Secondary | ICD-10-CM | POA: Diagnosis present

## 2020-04-17 DIAGNOSIS — Z515 Encounter for palliative care: Secondary | ICD-10-CM

## 2020-04-17 DIAGNOSIS — R64 Cachexia: Secondary | ICD-10-CM | POA: Diagnosis present

## 2020-04-17 DIAGNOSIS — R4182 Altered mental status, unspecified: Secondary | ICD-10-CM | POA: Diagnosis not present

## 2020-04-17 DIAGNOSIS — I959 Hypotension, unspecified: Secondary | ICD-10-CM | POA: Diagnosis not present

## 2020-04-17 DIAGNOSIS — R531 Weakness: Secondary | ICD-10-CM | POA: Diagnosis not present

## 2020-04-17 DIAGNOSIS — R29810 Facial weakness: Secondary | ICD-10-CM | POA: Diagnosis not present

## 2020-04-17 DIAGNOSIS — R6520 Severe sepsis without septic shock: Secondary | ICD-10-CM | POA: Diagnosis present

## 2020-04-17 DIAGNOSIS — N320 Bladder-neck obstruction: Secondary | ICD-10-CM | POA: Diagnosis present

## 2020-04-17 DIAGNOSIS — R000 Tachycardia, unspecified: Secondary | ICD-10-CM | POA: Diagnosis not present

## 2020-04-17 DIAGNOSIS — K5900 Constipation, unspecified: Secondary | ICD-10-CM | POA: Diagnosis present

## 2020-04-17 DIAGNOSIS — I452 Bifascicular block: Secondary | ICD-10-CM | POA: Diagnosis present

## 2020-04-17 DIAGNOSIS — Z9049 Acquired absence of other specified parts of digestive tract: Secondary | ICD-10-CM

## 2020-04-17 DIAGNOSIS — N1831 Chronic kidney disease, stage 3a: Secondary | ICD-10-CM | POA: Diagnosis present

## 2020-04-17 LAB — CBC WITH DIFFERENTIAL/PLATELET
Abs Immature Granulocytes: 0.05 10*3/uL (ref 0.00–0.07)
Basophils Absolute: 0 10*3/uL (ref 0.0–0.1)
Basophils Relative: 0 %
Eosinophils Absolute: 0 10*3/uL (ref 0.0–0.5)
Eosinophils Relative: 0 %
HCT: 30.5 % — ABNORMAL LOW (ref 39.0–52.0)
Hemoglobin: 10.1 g/dL — ABNORMAL LOW (ref 13.0–17.0)
Immature Granulocytes: 1 %
Lymphocytes Relative: 1 %
Lymphs Abs: 0.1 10*3/uL — ABNORMAL LOW (ref 0.7–4.0)
MCH: 32.5 pg (ref 26.0–34.0)
MCHC: 33.1 g/dL (ref 30.0–36.0)
MCV: 98.1 fL (ref 80.0–100.0)
Monocytes Absolute: 0.2 10*3/uL (ref 0.1–1.0)
Monocytes Relative: 3 %
Neutro Abs: 7.6 10*3/uL (ref 1.7–7.7)
Neutrophils Relative %: 95 %
Platelets: 187 10*3/uL (ref 150–400)
RBC: 3.11 MIL/uL — ABNORMAL LOW (ref 4.22–5.81)
RDW: 15.1 % (ref 11.5–15.5)
WBC: 8 10*3/uL (ref 4.0–10.5)
nRBC: 0 % (ref 0.0–0.2)

## 2020-04-17 LAB — URINALYSIS, ROUTINE W REFLEX MICROSCOPIC
Bilirubin Urine: NEGATIVE
Glucose, UA: NEGATIVE mg/dL
Hgb urine dipstick: NEGATIVE
Ketones, ur: NEGATIVE mg/dL
Leukocytes,Ua: NEGATIVE
Nitrite: NEGATIVE
Protein, ur: NEGATIVE mg/dL
Specific Gravity, Urine: 1.012 (ref 1.005–1.030)
pH: 6 (ref 5.0–8.0)

## 2020-04-17 LAB — PROTIME-INR
INR: 1.3 — ABNORMAL HIGH (ref 0.8–1.2)
Prothrombin Time: 16 seconds — ABNORMAL HIGH (ref 11.4–15.2)

## 2020-04-17 LAB — PROCALCITONIN: Procalcitonin: 12.16 ng/mL

## 2020-04-17 LAB — COMPREHENSIVE METABOLIC PANEL
ALT: 16 U/L (ref 0–44)
AST: 23 U/L (ref 15–41)
Albumin: 3.2 g/dL — ABNORMAL LOW (ref 3.5–5.0)
Alkaline Phosphatase: 83 U/L (ref 38–126)
Anion gap: 10 (ref 5–15)
BUN: 32 mg/dL — ABNORMAL HIGH (ref 8–23)
CO2: 22 mmol/L (ref 22–32)
Calcium: 8.5 mg/dL — ABNORMAL LOW (ref 8.9–10.3)
Chloride: 107 mmol/L (ref 98–111)
Creatinine, Ser: 1.47 mg/dL — ABNORMAL HIGH (ref 0.61–1.24)
GFR, Estimated: 42 mL/min — ABNORMAL LOW (ref >60)
Glucose, Bld: 91 mg/dL (ref 70–99)
Potassium: 4.3 mmol/L (ref 3.5–5.1)
Sodium: 139 mmol/L (ref 135–145)
Total Bilirubin: 0.9 mg/dL (ref 0.3–1.2)
Total Protein: 6.2 g/dL — ABNORMAL LOW (ref 6.5–8.1)

## 2020-04-17 LAB — RESP PANEL BY RT-PCR (FLU A&B, COVID) ARPGX2
Influenza A by PCR: NEGATIVE
Influenza B by PCR: NEGATIVE
SARS Coronavirus 2 by RT PCR: NEGATIVE

## 2020-04-17 LAB — LACTIC ACID, PLASMA
Lactic Acid, Venous: 1.8 mmol/L (ref 0.5–1.9)
Lactic Acid, Venous: 2 mmol/L (ref 0.5–1.9)
Lactic Acid, Venous: 1.5 mmol/L (ref 0.5–1.9)

## 2020-04-17 LAB — APTT: aPTT: 31 seconds (ref 24–36)

## 2020-04-17 MED ORDER — ONDANSETRON HCL 4 MG/2ML IJ SOLN
4.0000 mg | Freq: Four times a day (QID) | INTRAMUSCULAR | Status: DC | PRN
  Administered 2020-04-20: 4 mg via INTRAVENOUS
  Filled 2020-04-17: qty 2

## 2020-04-17 MED ORDER — VANCOMYCIN HCL IN DEXTROSE 1-5 GM/200ML-% IV SOLN
1000.0000 mg | Freq: Once | INTRAVENOUS | Status: AC
  Administered 2020-04-17: 1000 mg via INTRAVENOUS
  Filled 2020-04-17: qty 200

## 2020-04-17 MED ORDER — METRONIDAZOLE IN NACL 5-0.79 MG/ML-% IV SOLN
500.0000 mg | Freq: Once | INTRAVENOUS | Status: DC
  Filled 2020-04-17: qty 100

## 2020-04-17 MED ORDER — DOXAZOSIN MESYLATE 2 MG PO TABS
2.0000 mg | ORAL_TABLET | Freq: Every day | ORAL | Status: DC
  Administered 2020-04-18 – 2020-04-20 (×3): 2 mg via ORAL
  Filled 2020-04-17 (×3): qty 1

## 2020-04-17 MED ORDER — LACTATED RINGERS IV SOLN: INTRAVENOUS | Status: DC

## 2020-04-17 MED ORDER — SODIUM CHLORIDE 0.9 % IV SOLN
2.0000 g | INTRAVENOUS | Status: AC
  Administered 2020-04-17: 2 g via INTRAVENOUS
  Filled 2020-04-17: qty 2

## 2020-04-17 MED ORDER — ACETAMINOPHEN 325 MG PO TABS
650.0000 mg | ORAL_TABLET | Freq: Four times a day (QID) | ORAL | Status: DC | PRN
  Administered 2020-04-18 (×2): 650 mg via ORAL
  Filled 2020-04-17 (×2): qty 2

## 2020-04-17 MED ORDER — SODIUM CHLORIDE 0.9 % IV SOLN: INTRAVENOUS | Status: DC

## 2020-04-17 MED ORDER — LACTATED RINGERS IV BOLUS (SEPSIS)
1000.0000 mL | Freq: Once | INTRAVENOUS | Status: AC
  Administered 2020-04-17: 1000 mL via INTRAVENOUS

## 2020-04-17 MED ORDER — SODIUM CHLORIDE 0.9 % IV SOLN
1.0000 g | INTRAVENOUS | Status: DC
  Administered 2020-04-18 – 2020-04-21 (×4): 1 g via INTRAVENOUS
  Filled 2020-04-17: qty 1
  Filled 2020-04-17: qty 0.5
  Filled 2020-04-17 (×2): qty 10

## 2020-04-17 MED ORDER — ONDANSETRON HCL 4 MG PO TABS: 4.0000 mg | ORAL_TABLET | Freq: Four times a day (QID) | ORAL | Status: DC | PRN

## 2020-04-17 MED ORDER — ACETAMINOPHEN 325 MG PO TABS
650.0000 mg | ORAL_TABLET | Freq: Once | ORAL | Status: AC
  Administered 2020-04-17: 650 mg via ORAL
  Filled 2020-04-17: qty 2

## 2020-04-17 MED ORDER — ACETAMINOPHEN 650 MG RE SUPP: 650.0000 mg | Freq: Four times a day (QID) | RECTAL | Status: DC | PRN

## 2020-04-17 MED ORDER — LACTATED RINGERS IV BOLUS (SEPSIS)
1000.0000 mL | Freq: Once | INTRAVENOUS | Status: AC
Start: 2020-04-17 — End: 2020-04-17
  Administered 2020-04-17: 1000 mL via INTRAVENOUS

## 2020-04-17 MED ORDER — SENNA 8.6 MG PO TABS
1.0000 | ORAL_TABLET | Freq: Every day | ORAL | Status: DC
  Administered 2020-04-17 – 2020-04-20 (×4): 8.6 mg via ORAL
  Filled 2020-04-17 (×4): qty 1

## 2020-04-17 MED ORDER — SODIUM CHLORIDE 0.9 % IV SOLN: 2.0000 g | Freq: Once | INTRAVENOUS | Status: DC

## 2020-04-17 NOTE — ED Notes (Signed)
hospitalist at bedside

## 2020-04-17 NOTE — Sepsis Progress Note (Signed)
elink is monitoring this code sepsis 

## 2020-04-17 NOTE — Progress Notes (Signed)
A consult was received from an ED provider for Vancomycin per pharmacy dosing. In addition, pharmacy consulted to investigate beta-lactam allergy. If history of intolerance, mild allergy, or documented history of use of cephalosporins, pharmacy can adjust aztreonam to cefepime.  The patient's profile has been reviewed for ht/wt/allergies/indication/available labs.    Patient has tolerated several cephalosporins in the past, including Cefepime.   A one time order has been placed for Vancomycin 1g IV and Cefepime 2g IV.  Further antibiotics/pharmacy consults should be ordered by admitting physician if indicated.                       Thank you, Luiz Ochoa 04/05/2020  9:57 AM

## 2020-04-17 NOTE — ED Notes (Signed)
Attempted to start second PIV and draw second set of blood cultures. Unsuccessful x2. Will have another RN attempt.

## 2020-04-17 NOTE — ED Triage Notes (Signed)
Pt baseline with good appetite and ability to ambulate. Pt in past day has not been able to walk at all and has decreased appetite. Also noted to have foul smelling urine per caregiver.

## 2020-04-17 NOTE — ED Provider Notes (Signed)
[facility] DEPT Provider Note   CSN: [id number] Arrival date & time: 04/24/2020  5852     History Chief Complaint  Patient presents with  . Weakness    Howard Padilla is a 87 y.o. male.  HPI   Pt is a 87 y/o M with a h/o BPH, colon CA, dementia, VTE, HLD, HTN who presents to the ED today for eval of weakness.   There is a level 5 caveat as the pt has a h/o dementia. Per EMS, pt started having generalized weakness yesterday. He has also had decreased appetite. He is normally ambulatory at baseline but has been too weak to walk today. They further state his temp was 102F en route. He was tachy in the low 100s and BP was 90systolic.   7:78 AM Discussed case with the patient's wife, Howard Padilla. She states that last night the patient had a rough night. He was up all night getting in and out of bed. States this morning the patient did not know who she was and was not able to get out of bed by himself which he normally can. She denies any vomiting, diarrhea, cough.   Past Medical History:  Diagnosis Date  . BPH (benign prostatic hypertrophy)   . Cancer ([place])    colon  . Dementia ([place])   . DVT (deep venous thrombosis) ([place])    with pulmonary embo.  Marland Kitchen Hyperlipidemia   . Hypertension     Patient Active Problem List   Diagnosis Date Noted  . Acute metabolic encephalopathy 24/23/5361  . SBO (small bowel obstruction) ([place]) 03/29/2020  . Closed right hip fracture ([place]) 03/29/2020  . Near syncope 09/24/2017  . Acute kidney injury superimposed on chronic kidney disease ([place]) 09/24/2017  . Postural dizziness with presyncope 08/21/2017  . Bradycardia 07/14/2016  . Aphasia 01/06/2015  . Stroke ([place]) 01/06/2015  . Slurred speech   . TIA (transient ischemic attack)   . Urinary retention 12/25/2014  . Lung nodule 06/24/2013  . CAP (community acquired pneumonia) 06/23/2013  . 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 Lung abscess ([place]) 06/22/2013  . CKD  (chronic kidney disease) stage 3, GFR 30-59 ml/min (HCC) 06/22/2013  . OTHER ABNORMAL BLOOD CHEMISTRY 02/03/2010  . PERSONAL HISTORY OF THROMBOPHLEBITIS 02/03/2010  . WEIGHT LOSS 10/29/2008  . DVT 06/24/2007  . LEG PAIN, LEFT 05/28/2007  . SYNCOPE 05/28/2007  . ACUTE BRONCHITIS 05/25/2007  . Anemia of renal disease 11/05/2006  . HLD (hyperlipidemia) 07/30/2006  . Essential hypertension 07/30/2006  . BPH (benign prostatic hyperplasia) 07/30/2006  . History of malignant neoplasm of large intestine 07/30/2006    Past Surgical History:  Procedure Laterality Date  . COLON SURGERY     partial colectomy  . HERNIA REPAIR     ingunial       Family History  Family history unknown: Yes    Social History   Tobacco Use  . Smoking status: Former Smoker    Packs/day: 2.00    Years: 30.00    Pack years: 60.00    Types: Cigarettes    Quit date: 02/28/1983    Years since quitting: 37.1  . Smokeless tobacco: Never Used  Substance Use Topics  . Alcohol use: No  . Drug use: No    Home Medications Prior to Admission medications   Medication Sig Start Date End Date Taking? Authorizing Provider  acetaminophen (TYLENOL) 500 MG tablet Take 2 tablets (1,000 mg total) by mouth every 6 (six) hours as needed. Patient  taking differently: Take 1,000 mg by mouth every 6 (six) hours as needed for headache (pain). 08/25/17  Yes Emokpae, Courage, MD  amLODipine (NORVASC) 10 MG tablet TAKE 1 TABLET BY MOUTH EVERY DAY FOR BLOOD PRESSURE Patient taking differently: Take 10 mg by mouth daily. 04/08/20  Yes Isaac Bliss, Rayford Halsted, MD  doxazosin (CARDURA) 4 MG tablet TAKE 1/2 TABLET BY MOUTH DAILY Patient taking differently: Take 2 mg by mouth daily. 10/14/19  Yes Isaac Bliss, Rayford Halsted, MD  polyethylene glycol (MIRALAX / GLYCOLAX) 17 g packet Take 17 g by mouth daily. 03/31/20  Yes Nita Sells, MD  senna (SENOKOT) 8.6 MG TABS tablet Take 1 tablet (8.6 mg total) by mouth daily. 03/30/20  Yes  Nita Sells, MD    Allergies    Penicillins  Review of Systems   Review of Systems  Unable to perform ROS: Dementia    Physical Exam Updated Vital Signs BP 111/66 (BP Location: Right Arm)   Pulse 81   Temp 99.9 F (37.7 C) (Rectal)   Resp 17   Ht 5\' 5"  (1.651 m)   Wt 53.7 kg   SpO2 98%   BMI 19.70 kg/m   Physical Exam Vitals and nursing note reviewed.  Constitutional:      Appearance: He is well-developed and well-nourished.     Comments: Chronically ill appearing  HENT:     Head: Normocephalic and atraumatic.  Eyes:     Conjunctiva/sclera: Conjunctivae normal.  Cardiovascular:     Rate and Rhythm: Regular rhythm. Tachycardia present.     Heart sounds: No murmur heard.   Pulmonary:     Effort: Pulmonary effort is normal. No respiratory distress.     Breath sounds: Normal breath sounds. No wheezing, rhonchi or rales.  Abdominal:     General: Bowel sounds are normal.     Palpations: Abdomen is soft.     Tenderness: There is no abdominal tenderness. There is no guarding or rebound.  Musculoskeletal:        General: No edema.     Cervical back: Neck supple.  Skin:    General: Skin is warm and dry.  Neurological:     Mental Status: He is alert.     Comments: Moving all extremities  Psychiatric:        Mood and Affect: Mood and affect normal.     ED Results / Procedures / Treatments   Labs (all labs ordered are listed, but only abnormal results are displayed) Labs Reviewed  COMPREHENSIVE METABOLIC PANEL - Abnormal; Notable for the following components:      Result Value   BUN 32 (*)    Creatinine, Ser 1.47 (*)    Calcium 8.5 (*)    Total Protein 6.2 (*)    Albumin 3.2 (*)    GFR, Estimated 42 (*)    All other components within normal limits  CBC WITH DIFFERENTIAL/PLATELET - Abnormal; Notable for the following components:   RBC 3.11 (*)    Hemoglobin 10.1 (*)    HCT 30.5 (*)    Lymphs Abs 0.1 (*)    All other components within normal  limits  PROTIME-INR - Abnormal; Notable for the following components:   Prothrombin Time 16.0 (*)    INR 1.3 (*)    All other components within normal limits  RESP PANEL BY RT-PCR (FLU A&B, COVID) ARPGX2  CULTURE, BLOOD (ROUTINE X 2)  CULTURE, BLOOD (ROUTINE X 2)  URINE CULTURE  LACTIC ACID, PLASMA  APTT  URINALYSIS, ROUTINE W REFLEX MICROSCOPIC  LACTIC ACID, PLASMA    EKG EKG Interpretation  Date/Time:  Saturday April 17 2020 09:34:21 EST Ventricular Rate:  103 PR Interval:    QRS Duration: 102 QT Interval:  360 QTC Calculation: 472 R Axis:   -70 Text Interpretation: Sinus tachycardia Left atrial enlargement Incomplete RBBB and LAFB Abnormal R-wave progression, early transition When compared to prior, similar apperance. No STEMI Confirmed by Antony Blackbird [phone]) on 03/31/2020 10:53:27 AM   Radiology DG Chest Port 1 View  Result Date: 04/07/2020 CLINICAL DATA:  One 74 in 84-year-old male with a history of questionable sepsis EXAM: PORTABLE CHEST 1 VIEW COMPARISON:  Chest x-ray 09/24/2017 FINDINGS: Apical lordotic positioning accentuates the vascular pedicle, with redemonstration of tortuous thoracic aorta as was demonstrated previously. No evidence of interlobular septal thickening. Focal opacity in the left mid lung is essentially unchanged from the prior plain film. Patchy opacity of the mid right lung in the retrocardiac region appears new. No pneumothorax.  No pleural effusion. IMPRESSION: Patchy airspace opacity in the right mid and lower lung appears new from the comparison, potentially progressive scarring versus lobar pneumonia. If further imaging is necessary, a formal PA and lateral chest x-ray may be considered or alternatively chest CT. Tortuous thoracic aorta. Scarring in the left lung. Electronically Signed   By: Corrie Mckusick D.O.   On: 04/24/2020 10:24    Procedures Procedures   Medications Ordered in ED Medications  metroNIDAZOLE (FLAGYL) IVPB 500 mg  (has no administration in time range)  vancomycin (VANCOCIN) IVPB 1000 mg/200 mL premix (1,000 mg Intravenous New Bag/Given 04/14/2020 1116)  lactated ringers bolus 1,000 mL (0 mLs Intravenous Stopped 04/16/2020 1122)    And  lactated ringers bolus 1,000 mL (1,000 mLs Intravenous New Bag/Given 03/30/2020 1115)  acetaminophen (TYLENOL) tablet 650 mg (650 mg Oral Given 04/19/2020 1115)  ceFEPIme (MAXIPIME) 2 g in sodium chloride 0.9 % 100 mL IVPB (2 g Intravenous New Bag/Given 04/16/2020 1025)    ED Course  I have reviewed the triage vital signs and the nursing notes.  Pertinent labs & imaging results that were available during my care of the patient were reviewed by me and considered in my medical decision making (see chart for details).    MDM Rules/Calculators/A&P                           87 y/o M presenting for eval of weakness. Per ems he was febrile, tachycardia, and hypotensive en route. On arrival he is tachycardic, borderline febrile. Code sepsis initiated and ivf, abx, and antipyretics were ordered.   Reviewed/interpreted labs CBC w/o leukocytosis, mild anemia CMP with elevated BUN.Cr which appears baseline, otherwise appears baseline  Lactic wnl UA pending on admission Covid pending on admission Coags with mildly elevated INR  EKG - Sinus tachycardia Left atrial enlargement Incomplete RBBB and LAFB Abnormal R-wave progression, early transition When compared to prior, similar apperance. No STEMI  Reviewed/interpreted labs CXR - Patchy airspace opacity in the right mid and lower lung appears new from the comparison, potentially progressive scarring versus lobar pneumonia. If further imaging is necessary, a formal PA and lateral chest x-ray may be considered or alternatively chest CT. Tortuous thoracic aorta. Scarring in the left lung.  Bladder scan revealed 53ml   Pt presenting with generalized weakness and fever. Found to have pneumonia on chest xray. abx and ivf given here in the ED.  Initially hypotensive with EMS but  that is improving here in the ED. Will admit for further treatment  11:11 AM CONSULT with Dr. Marylyn Ishihara with hospitalist service who accepts patient for admission.   Final Clinical Impression(s) / ED Diagnoses Final diagnoses:  Community acquired pneumonia, unspecified laterality    Rx / DC Orders ED Discharge Orders    None       Rodney Booze, PA-C 04/02/2020 1212    Tegeler, Gwenyth Allegra, MD 04/19/20 [phone]

## 2020-04-17 NOTE — ED Notes (Signed)
At approx. 1405 pt found to be lying in floor. Bed rails were up but patient had scooted to end of bed. IV dislodged. Hrina, PA in ED evaluated pt who appeared to be unharmed. Dr. Marylyn Ishihara, hospitalist notified who gave to further orders of care for pt. Pt was once again placed back in bed, linens changed, IV restarted, yellow socks placed on pt. Bed alarm reset. Fall huddle form filled out.

## 2020-04-17 NOTE — H&P (Addendum)
History and Physical    Howard Padilla FBP:[id number] DOB: [birth year] DOA: 04/04/2020  PCP: Isaac Bliss, Rayford Halsted, MD  Patient coming from: Home  Chief Complaint: generalized weakness, confusion  HPI: Howard Padilla is a 87 y.o. male with medical history significant of HTN, BPH, TIAs, CKD3a. Presenting with generalized weakness and confusion. History from wife as patient is confused. She reports that before he went to bed, the patient was normal yesterday. Last night he was restless and continued getting up and calling his wife's name. This morning when she went to check on him, he was confused. He wasn't acting like himself. He didn't recognize his wife. He wasn't able to speak and he was unable to get out of bed to his chair like he normally does. His wife became concerned and called for EMS. She denies any other alleviating or aggravating factors.   ED Course: Patient was started on sepsis protocol. CXR showed questionable PNA. TRH was called for admission.   Review of Systems:  Unable to obtain d/t mention. Per wife's report, he had a complaint of being quickly full at last night's dinner, but otherwise normal.  PMHx Past Medical History:  Diagnosis Date  . BPH (benign prostatic hypertrophy)   . Cancer ([place])    colon  . Dementia ([place])   . DVT (deep venous thrombosis) ([place])    with pulmonary embo.  Marland Kitchen Hyperlipidemia   . Hypertension     PSHx Past Surgical History:  Procedure Laterality Date  . COLON SURGERY     partial colectomy  . HERNIA REPAIR     ingunial    Soc[facility]  reports that he quit smoking about 37 years ago. His smoking use included cigarettes. He has a 60.00 pack-year smoking history. He has never used smokeless tobacco. He reports that he does not drink alcohol and does not use drugs.  Allergies  Allergen Reactions  . Penicillins Rash and Other (See Comments)    Has patient had a PCN reaction causing immediate rash, facial/tongue/throat swelling, SOB or  lightheadedness with hypotension: No Has patient had a PCN reaction causing severe rash involving mucus membranes or skin necrosis: No Has patient had a PCN reaction that required hospitalization No Has patient had a PCN reaction occurring within the last 10 years: No If all of the above answers are "NO", then may proceed with Cephalosporin use.     FamHx Family History  Family history unknown: Yes    Prior to Admission medications   Medication Sig Start Date End Date Taking? Authorizing Provider  acetaminophen (TYLENOL) 500 MG tablet Take 2 tablets (1,000 mg total) by mouth every 6 (six) hours as needed. Patient taking differently: Take 1,000 mg by mouth every 6 (six) hours as needed for headache (pain). 08/25/17  Yes Emokpae, Courage, MD  amLODipine (NORVASC) 10 MG tablet TAKE 1 TABLET BY MOUTH EVERY DAY FOR BLOOD PRESSURE Patient taking differently: Take 10 mg by mouth daily. 04/08/20  Yes Isaac Bliss, Rayford Halsted, MD  doxazosin (CARDURA) 4 MG tablet TAKE 1/2 TABLET BY MOUTH DAILY Patient taking differently: Take 2 mg by mouth daily. 10/14/19  Yes Isaac Bliss, Rayford Halsted, MD  polyethylene glycol (MIRALAX / GLYCOLAX) 17 g packet Take 17 g by mouth daily. 03/31/20  Yes Nita Sells, MD  senna (SENOKOT) 8.6 MG TABS tablet Take 1 tablet (8.6 mg total) by mouth daily. 03/30/20  Yes Nita Sells, MD    Physical Exam: Vitals:   04/26/2020 1000 04/19/2020 1007 04/14/2020 1012 04/15/2020  1030  BP: 106/64   120/75  Pulse:      Resp: 17   (!) 23  Temp:      TempSrc:      SpO2:   100%   Weight:  53.7 kg    Height:  5\' 5"  (1.651 m)      General: 87 y.o. male resting in bed in NAD Eyes: PERRL, normal sclera ENMT: Nares patent w/o discharge, orophaynx clear, dentition normal, ears w/o discharge/lesions/ulcers Neck: thin, trachea midline Cardiovascular: RRR, +S1, S2, no m/g/r, equal pulses throughout Respiratory: CTABL, no w/r/r, normal WOB GI: BS+, NDNT, no masses noted, no  organomegaly noted MSK: No e/c/c Skin: No rashes, bruises, ulcerations noted; temporal wasting noted Neuro: Awake but repeats same mumbling answer to most of the questions, does not follow commands, unable to access focality  Labs on Admission: I have personally reviewed following labs and imaging studies  CBC: Recent Labs  Lab 04/09/2020 0959  WBC 8.0  NEUTROABS 7.6  HGB 10.1*  HCT 30.5*  MCV 98.1  PLT 932   Basic Metabolic Panel: Recent Labs  Lab 04/26/2020 0959  NA 139  K 4.3  CL 107  CO2 22  GLUCOSE 91  BUN 32*  CREATININE 1.47*  CALCIUM 8.5*   GFR: Estimated Creatinine Clearance: 19.3 mL/min (A) (by C-G formula based on SCr of 1.47 mg/dL (H)). Liver Function Tests: Recent Labs  Lab 04/16/2020 0959  AST 23  ALT 16  ALKPHOS 83  BILITOT 0.9  PROT 6.2*  ALBUMIN 3.2*   No results for input(s): LIPASE, AMYLASE in the last 168 hours. No results for input(s): AMMONIA in the last 168 hours. Coagulation Profile: Recent Labs  Lab 04/02/2020 0959  INR 1.3*   Cardiac Enzymes: No results for input(s): CKTOTAL, CKMB, CKMBINDEX, TROPONINI in the last 168 hours. BNP (last 3 results) No results for input(s): PROBNP in the last 8760 hours. HbA1C: No results for input(s): HGBA1C in the last 72 hours. CBG: No results for input(s): GLUCAP in the last 168 hours. Lipid Profile: No results for input(s): CHOL, HDL, LDLCALC, TRIG, CHOLHDL, LDLDIRECT in the last 72 hours. Thyroid Function Tests: No results for input(s): TSH, T4TOTAL, FREET4, T3FREE, THYROIDAB in the last 72 hours. Anemia Panel: No results for input(s): VITAMINB12, FOLATE, FERRITIN, TIBC, IRON, RETICCTPCT in the last 72 hours. Urine analysis:    Component Value Date/Time   COLORURINE YELLOW 03/29/2020 0343   APPEARANCEUR CLEAR 03/29/2020 0343   LABSPEC 1.020 03/29/2020 0343   PHURINE 6.0 03/29/2020 0343   GLUCOSEU NEGATIVE 03/29/2020 0343   HGBUR NEGATIVE 03/29/2020 0343   BILIRUBINUR NEGATIVE 03/29/2020  0343   KETONESUR NEGATIVE 03/29/2020 0343   PROTEINUR 30 (A) 03/29/2020 0343   UROBILINOGEN 0.2 01/06/2015 0927   NITRITE NEGATIVE 03/29/2020 0343   LEUKOCYTESUR NEGATIVE 03/29/2020 0343    Radiological Exams on Admission: DG Chest Port 1 View  Result Date: 04/18/2020 CLINICAL DATA:  One 78 in 10-year-old male with a history of questionable sepsis EXAM: PORTABLE CHEST 1 VIEW COMPARISON:  Chest x-ray 09/24/2017 FINDINGS: Apical lordotic positioning accentuates the vascular pedicle, with redemonstration of tortuous thoracic aorta as was demonstrated previously. No evidence of interlobular septal thickening. Focal opacity in the left mid lung is essentially unchanged from the prior plain film. Patchy opacity of the mid right lung in the retrocardiac region appears new. No pneumothorax.  No pleural effusion. IMPRESSION: Patchy airspace opacity in the right mid and lower lung appears new from the comparison, potentially progressive scarring  versus lobar pneumonia. If further imaging is necessary, a formal PA and lateral chest x-ray may be considered or alternatively chest CT. Tortuous thoracic aorta. Scarring in the left lung. Electronically Signed   By: Corrie Mckusick D.O.   On: 04/16/2020 10:24    EKG: Independently reviewed. Sinus tach, no ST elevations  Assessment/Plan Acute metabolic encephalopathy     - place in obs, tele     - looking at TIA/CVA vs infection     - CXR w/ possible RML, RLL PNA; but he's on RA and lung findings are normal; will check procal and COVID is pending (he is thrice vaccinated per his wife, but not flu vax'd)     - UA ordered but not collected; wife says his urine is always foul smelling     - CTH ordered; d/w wife possibility of stroke and what a full w/u would entail. LKN was last night, so he's outside the range for tPa and she would not want any surgical intervention. The full imaging workup would be of low value then. We will have PT/OT look at him. Make sure  he is on a statin. Can keep CTH to assess for hemorrhage     - started on broad spec abx; narrow to rocephin only     - lactic acid is fine  Generalized weakness     - he generally moves from bed to his chair; per PCP notes, generalized weakness is a chronic issue     - see above discuss on stroke     - PT/OT  Hypotension?     - responsive to fluid bolus; hold home meds  CKD3a     - he is at baseline; watch nephrotoxins  Normocytic anemia     - no evidence of bleed, follow  Hx of BPH     - continue home regimen  DVT prophylaxis: SCDs  Code Status: DNR, confirmed with wife by phone  Family Communication: w/ wife by phone  Consults called: Palliative Care  Status is: Observation  The patient remains OBS appropriate and will d/c before 2 midnights.  Dispo: The patient is from: Home              Anticipated d/c is to: Home              Anticipated d/c date is: 1 day              Patient currently is not medically stable to d/c.   Difficult to place patient No  Howard Finner DO [facility]  If 7PM-7AM, please contact night-coverage [email or web address]  04/06/2020, 11:27 AM

## 2020-04-18 DIAGNOSIS — Z515 Encounter for palliative care: Secondary | ICD-10-CM | POA: Diagnosis not present

## 2020-04-18 DIAGNOSIS — Z7189 Other specified counseling: Secondary | ICD-10-CM

## 2020-04-18 DIAGNOSIS — J189 Pneumonia, unspecified organism: Secondary | ICD-10-CM | POA: Diagnosis not present

## 2020-04-18 DIAGNOSIS — G9341 Metabolic encephalopathy: Secondary | ICD-10-CM | POA: Diagnosis not present

## 2020-04-18 DIAGNOSIS — Z66 Do not resuscitate: Secondary | ICD-10-CM

## 2020-04-18 LAB — COMPREHENSIVE METABOLIC PANEL
ALT: 20 U/L (ref 0–44)
AST: 45 U/L — ABNORMAL HIGH (ref 15–41)
Albumin: 3 g/dL — ABNORMAL LOW (ref 3.5–5.0)
Alkaline Phosphatase: 82 U/L (ref 38–126)
Anion gap: 11 (ref 5–15)
BUN: 31 mg/dL — ABNORMAL HIGH (ref 8–23)
CO2: 19 mmol/L — ABNORMAL LOW (ref 22–32)
Calcium: 8.8 mg/dL — ABNORMAL LOW (ref 8.9–10.3)
Chloride: 107 mmol/L (ref 98–111)
Creatinine, Ser: 1.44 mg/dL — ABNORMAL HIGH (ref 0.61–1.24)
GFR, Estimated: 43 mL/min — ABNORMAL LOW (ref >60)
Glucose, Bld: 65 mg/dL — ABNORMAL LOW (ref 70–99)
Potassium: 4.4 mmol/L (ref 3.5–5.1)
Sodium: 137 mmol/L (ref 135–145)
Total Bilirubin: 1 mg/dL (ref 0.3–1.2)
Total Protein: 6.3 g/dL — ABNORMAL LOW (ref 6.5–8.1)

## 2020-04-18 LAB — URINE CULTURE: Culture: NO GROWTH

## 2020-04-18 LAB — CBC
HCT: 34.2 % — ABNORMAL LOW (ref 39.0–52.0)
Hemoglobin: 11.2 g/dL — ABNORMAL LOW (ref 13.0–17.0)
MCH: 32.6 pg (ref 26.0–34.0)
MCHC: 32.7 g/dL (ref 30.0–36.0)
MCV: 99.4 fL (ref 80.0–100.0)
Platelets: 197 10*3/uL (ref 150–400)
RBC: 3.44 MIL/uL — ABNORMAL LOW (ref 4.22–5.81)
RDW: 15.2 % (ref 11.5–15.5)
WBC: 12.6 10*3/uL — ABNORMAL HIGH (ref 4.0–10.5)
nRBC: 0 % (ref 0.0–0.2)

## 2020-04-18 LAB — MRSA PCR SCREENING: MRSA by PCR: NEGATIVE

## 2020-04-18 MED ORDER — HALOPERIDOL LACTATE 5 MG/ML IJ SOLN
2.0000 mg | Freq: Once | INTRAMUSCULAR | Status: AC
  Administered 2020-04-18: 2 mg via INTRAVENOUS
  Filled 2020-04-18: qty 1

## 2020-04-18 MED ORDER — HYDROXYZINE HCL 50 MG/ML IM SOLN
25.0000 mg | Freq: Four times a day (QID) | INTRAMUSCULAR | Status: AC | PRN
  Administered 2020-04-18 – 2020-04-20 (×2): 25 mg via INTRAMUSCULAR
  Filled 2020-04-18 (×4): qty 0.5

## 2020-04-18 MED ORDER — QUETIAPINE FUMARATE 25 MG PO TABS
12.5000 mg | ORAL_TABLET | Freq: Two times a day (BID) | ORAL | Status: DC
  Administered 2020-04-18 – 2020-04-20 (×6): 12.5 mg via ORAL
  Filled 2020-04-18 (×6): qty 1

## 2020-04-18 MED ORDER — POLYETHYLENE GLYCOL 3350 17 G PO PACK
17.0000 g | PACK | Freq: Every day | ORAL | Status: DC
  Administered 2020-04-18 – 2020-04-20 (×3): 17 g via ORAL
  Filled 2020-04-18 (×3): qty 1

## 2020-04-18 MED ORDER — HYDROXYZINE HCL 50 MG/ML IM SOLN
25.0000 mg | Freq: Four times a day (QID) | INTRAMUSCULAR | Status: DC | PRN
  Administered 2020-04-20: 25 mg via INTRAMUSCULAR
  Filled 2020-04-18 (×4): qty 0.5

## 2020-04-18 MED ORDER — ENOXAPARIN SODIUM 40 MG/0.4ML ~~LOC~~ SOLN: 40.0000 mg | SUBCUTANEOUS | Status: DC

## 2020-04-18 MED ORDER — ENOXAPARIN SODIUM 30 MG/0.3ML ~~LOC~~ SOLN
30.0000 mg | SUBCUTANEOUS | Status: DC
  Administered 2020-04-18 – 2020-04-20 (×3): 30 mg via SUBCUTANEOUS
  Filled 2020-04-18 (×3): qty 0.3

## 2020-04-18 MED ORDER — DIPHENHYDRAMINE HCL 50 MG/ML IJ SOLN
25.0000 mg | Freq: Once | INTRAMUSCULAR | Status: AC
  Administered 2020-04-19: 25 mg via INTRAVENOUS
  Filled 2020-04-18: qty 1

## 2020-04-18 NOTE — Progress Notes (Addendum)
PROGRESS NOTE  Howard Padilla UMP:[id number] DOB: 08-25-1915 DOA: 04/07/2020 PCP: Isaac Bliss, Rayford Halsted, MD  HPI/Recap of past 24 hours: Howard Padilla is a 87 y.o. male with medical history significant of HTN, BPH, TIAs, CVAs, CKD3a. Presenting with generalized weakness and confusion. History from wife as patient is confused. She reports that before he went to bed, the patient was normal yesterday. Last night he was restless and continued getting up and calling his wife's name. This morning when she went to check on him, he was confused. He wasn't acting like himself. He didn't recognize his wife. He wasn't able to speak and he was unable to get out of bed to his chair like he normally does. His wife became concerned and called for EMS.  In the ED, patient was started on sepsis protocol. CXR showed questionable PNA. TRH was called for admission.   04/18/20: Seen and examined at bedside. Pleasantly confused. He denies having any pain. He is currently on IV fluid and on IV antibiotics empirically to cover community-acquired pneumonia.   Need to rule out dysphagia with aspiration in the setting of prior strokes. Speech therapist consulted for swallow evaluation. Aspiration precautions in place.   Assessment/Plan: Active Problems:   Acute metabolic encephalopathy  Sepsis secondary to community-acquired pneumonia, POA Presented with leukocytosis, tachypnea with respiration rate of 26, infiltrates on chest x-ray indicative of pneumonia. Procalcitonin greater than 12 Personally reviewed chest x-ray done on 04/21/2020 showing bilateral infiltrates mainly affecting the right mid and lower lobes. Blood cultures negative to date. Continue to follow. Started on Rocephin empirically from the ED, continue. Obtain sputum culture.  Acute metabolic encephalopathy in the setting of history of multiple strokes, acute illness, sepsis. CT head on admission nonacute. Reorient as needed Fall  precautions, aspiration cautions, delirium precautions.  Suspected dysphagia with possible aspiration Speech therapist to evaluate Aspiration precautions in place  History of multiple strokes No acute intracranial findings from CT head on 04/25/2020. Continue to monitor.  Essential hypertension BP is currently soft in the setting of sepsis Continue to maintain MAP greater than 65 Continue to hold off home oral antihypertensives. Continue gentle IV fluid hydration. Continue to closely monitor vital signs.  BPH Continue home cardura Monitor UO  Chronic constipation Resume home bowel regimen.  Physical debility PT OT to assess Family is planning on returning to home with home health services Fall precautions.   Code Status: DNR.  Family Communication: None at bedside.  Disposition Plan: Likely discharge to home with home health services on 2020-04-20.   Consultants:  None.  Procedures:  None.  Antimicrobials:  Rocephin.  DVT prophylaxis: Subcu Lovenox daily.  Status is: Observation    Dispo:  Patient From: Home  Planned Disposition: Home  Expected discharge date: 04/21/2020  Medically stable for discharge: No, ongoing management of sepsis secondary to pneumonia.       Objective: Vitals:   04/02/2020 1544 04/16/2020 1953 04/22/2020 2340 04/18/20 1347  BP: 121/73 94/62 122/61 123/69  Pulse: 75 63 76 85  Resp: 16 20 19 14   Temp: 97.6 F (36.4 C) 97.8 F (36.6 C) 98.7 F (37.1 C) (!) 97.4 F (36.3 C)  TempSrc:    Oral  SpO2: 98% 99% 100% 97%  Weight:      Height:        Intake/Output Summary (Last 24 hours) at 04/18/2020 1407 Last data filed at 04/18/2020 0900 Gross per 24 hour  Intake 1232.32 ml  Output 100 ml  Net 1132.32 ml  Filed Weights   04/26/2020 1007  Weight: 53.7 kg    Exam:  . General: 87 y.o. year-old male well developed well nourished in no acute distress.  Alert and pleasantly confused. . Cardiovascular: Regular rate and  rhythm with no rubs or gallops.  No thyromegaly or JVD noted.   Marland Kitchen Respiratory: Rales noted particularly on the right side.  Poor inspiratory effort.   . Abdomen: Soft nontender nondistended with normal bowel sounds x4 quadrants. . Musculoskeletal: No lower extremity edema.  . Skin: No ulcerative lesions noted or rashes. . Psychiatry: Mood is appropriate for condition and setting   Data Reviewed: CBC: Recent Labs  Lab 04/16/2020 0959 04/18/20 0800  WBC 8.0 12.6*  NEUTROABS 7.6  --   HGB 10.1* 11.2*  HCT 30.5* 34.2*  MCV 98.1 99.4  PLT 187 203   Basic Metabolic Panel: Recent Labs  Lab 04/24/2020 0959 04/18/20 0800  NA 139 137  K 4.3 4.4  CL 107 107  CO2 22 19*  GLUCOSE 91 65*  BUN 32* 31*  CREATININE 1.47* 1.44*  CALCIUM 8.5* 8.8*   GFR: Estimated Creatinine Clearance: 19.7 mL/min (A) (by C-G formula based on SCr of 1.44 mg/dL (H)). Liver Function Tests: Recent Labs  Lab 04/14/2020 0959 04/18/20 0800  AST 23 45*  ALT 16 20  ALKPHOS 83 82  BILITOT 0.9 1.0  PROT 6.2* 6.3*  ALBUMIN 3.2* 3.0*   No results for input(s): LIPASE, AMYLASE in the last 168 hours. No results for input(s): AMMONIA in the last 168 hours. Coagulation Profile: Recent Labs  Lab 04/02/2020 0959  INR 1.3*   Cardiac Enzymes: No results for input(s): CKTOTAL, CKMB, CKMBINDEX, TROPONINI in the last 168 hours. BNP (last 3 results) No results for input(s): PROBNP in the last 8760 hours. HbA1C: No results for input(s): HGBA1C in the last 72 hours. CBG: No results for input(s): GLUCAP in the last 168 hours. Lipid Profile: No results for input(s): CHOL, HDL, LDLCALC, TRIG, CHOLHDL, LDLDIRECT in the last 72 hours. Thyroid Function Tests: No results for input(s): TSH, T4TOTAL, FREET4, T3FREE, THYROIDAB in the last 72 hours. Anemia Panel: No results for input(s): VITAMINB12, FOLATE, FERRITIN, TIBC, IRON, RETICCTPCT in the last 72 hours. Urine analysis:    Component Value Date/Time   COLORURINE  YELLOW 04/16/2020 [place] 04/04/2020 1145   LABSPEC 1.012 04/19/2020 1145   PHURINE 6.0 04/05/2020 1145   GLUCOSEU NEGATIVE 04/20/2020 1145   HGBUR NEGATIVE 04/03/2020 1145   BILIRUBINUR NEGATIVE 04/22/2020 1145   KETONESUR NEGATIVE 04/14/2020 1145   PROTEINUR NEGATIVE 04/13/2020 1145   UROBILINOGEN 0.2 01/06/2015 0927   NITRITE NEGATIVE 04/10/2020 1145   LEUKOCYTESUR NEGATIVE 04/05/2020 1145   Sepsis Labs: @LABRCNTIP (procalcitonin:4,lacticidven:4)  ) Recent Results (from the past 240 hour(s))  Blood Culture (routine x 2)     Status: None (Preliminary result)   Collection Time: 04/12/2020  9:46 AM   Specimen: BLOOD  Result Value Ref Range Status   Specimen Description   Final    BLOOD BLOOD LEFT FOREARM Performed at [facility], [place] [address] [place], [place] [address]    Special Requests   Final    BOTTLES DRAWN AEROBIC AND ANAEROBIC Blood Culture adequate volume Performed at [facility] [address], [place], [place] [address]    Culture   Final    NO GROWTH < 24 HOURS Performed at [facility] [address], [place], [place] [address]    Report Status  PENDING  Incomplete  Resp Panel by RT-PCR (Flu A&B, Covid) Nasopharyngeal Swab     Status: None   Collection Time: 04/21/2020 11:30 AM   Specimen: Nasopharyngeal Swab; Nasopharyngeal(NP) swabs in vial transport medium  Result Value Ref Range Status   SARS Coronavirus 2 by RT PCR NEGATIVE NEGATIVE Final    Comment: (NOTE) SARS-CoV-2 target nucleic acids are NOT DETECTED.  The SARS-CoV-2 RNA is generally detectable in upper respiratory specimens during the acute phase of infection. The lowest concentration of SARS-CoV-2 viral copies this assay can detect is 138 copies/mL. A negative result does not preclude SARS-Cov-2 infection and should not be used as the sole basis for treatment or other patient management decisions. A negative result may occur with   improper specimen collection/handling, submission of specimen other than nasopharyngeal swab, presence of viral mutation(s) within the areas targeted by this assay, and inadequate number of viral copies(<138 copies/mL). A negative result must be combined with clinical observations, patient history, and epidemiological information. The expected result is Negative.  Fact Sheet for Patients:  EntrepreneurPulse.com.au  Fact Sheet for Healthcare Providers:  IncredibleEmployment.be  This test is no t yet approved or cleared by the [place] FDA and  has been authorized for detection and/or diagnosis of SARS-CoV-2 by FDA under an Emergency Use Authorization (EUA). This EUA will remain  in effect (meaning this test can be used) for the duration of the COVID-19 declaration under Section 564(b)(1) of the Act, 21 U.S.C.section 360bbb-3(b)(1), unless the authorization is terminated  or revoked sooner.       Influenza A by PCR NEGATIVE NEGATIVE Final   Influenza B by PCR NEGATIVE NEGATIVE Final    Comment: (NOTE) The Xpert Xpress SARS-CoV-2/FLU/RSV plus assay is intended as an aid in the diagnosis of influenza from Nasopharyngeal swab specimens and should not be used as a sole basis for treatment. Nasal washings and aspirates are unacceptable for Xpert Xpress SARS-CoV-2/FLU/RSV testing.  Fact Sheet for Patients: EntrepreneurPulse.com.au  Fact Sheet for Healthcare Providers: IncredibleEmployment.be  This test is not yet approved or cleared by the [place] FDA and has been authorized for detection and/or diagnosis of SARS-CoV-2 by FDA under an Emergency Use Authorization (EUA). This EUA will remain in effect (meaning this test can be used) for the duration of the COVID-19 declaration under Section 564(b)(1) of the Act, 21 U.S.C. section 360bbb-3(b)(1), unless the authorization is terminated  or revoked.  Performed at [facility], [facility], [place] [address], [place], [place] [address]       Studies: No results found.  Scheduled Meds: . doxazosin  2 mg Oral Daily  . QUEtiapine  12.5 mg Oral BID  . senna  1 tablet Oral Daily    Continuous Infusions: . sodium chloride Stopped (04/18/20 0410)  . cefTRIAXone (ROCEPHIN)  IV 1 g (04/18/20 0851)     LOS: 0 days     Kayleen Memos, MD [facility] Pager [phone]  If 7PM-7AM, please contact night-coverage [email or web address] Password [email or web address] 04/18/2020, 2:07 PM

## 2020-04-18 NOTE — Progress Notes (Signed)
PT Cancellation Note  Patient Details Name: Howard Padilla MRN: [id number] DOB: 1915/07/14   Cancelled Treatment:    Reason Eval/Treat Not Completed: Other (comment). Pt continues to be increasingly confused, pulling at lines, etc. Will continue efforts to see pt for PT Eval   [facility] 04/18/2020, 5:09 PM

## 2020-04-18 NOTE — Consult Note (Signed)
Consultation Note Date: 04/18/2020   Patient Name: Howard Padilla  DOB: 04/01/1915  MRN: [id number]  Age / Sex: 87 y.o., male   PCP: Isaac Bliss, Rayford Halsted, MD Referring Physician: Kayleen Memos, DO   REASON FOR CONSULTATION:Establishing goals of care  Palliative Care consult requested for goals of care discussion in this 87 y.o. male with multiple medical problems including TIAs, BPH, CKD3, and hypertension. Patient presented to the ED from home with complaints of generalized weakness and confusion. Chest x-ray showed pneumonia and patient recieving IV antibiotics vis sepsis protocol. Remains confused.   Clinical Assessment and Goals of Care: I have reviewed medical records including lab results, imaging, Epic notes, and MAR. I spoke with patient's wife, Mrs. Caylan Schifano to discuss diagnosis prognosis, [place], EOL wishes, disposition and options.  I introduced Palliative Medicine as specialized medical care for people living with serious illness. It focuses on providing relief from the symptoms and stress of a serious illness. The goal is to improve quality of life for both the patient and the family. Wife verbalized understanding and appreciation.   We discussed a brief life review of the patient, along with his functional and nutritional status. Mrs. Orvis shares she and patient have been married for over 110 years. They have 2 sets of twins and 8 children total between the 2 (as this is both of their second marriages). 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 Wife shares patient loves baseball and has been proud of his children. His son works at [employer] (retired Scientist, forensic) and 2 daughters who are Public house manager.   Prior to his recent illness wife reports patient had a great appetite. Would often eat 3 full meals with second servings in addition to snacking throughout the day. Patient does not eat pork. Patient would awaken  between 630a-7a each day. He wore a depend and was incontinent. Wife or son (who lives in the home) would assist with bathing and changing of his breath. Patient was able to assist some with ADLs including feeding himself. Although patient has some dementia he was able to recognize family and engage in intelligent discussions which is what concerned wife when patient was unable to recognize her and get up out of the bed. He was ambulatory around his room with stand-by assist. He did not like using cane or walker.   We discussed His current illness and what it means in the larger context of His on-going co-morbidities.Natural disease trajectory and expectations at EOL were discussed.  I discussed at length patient's dementia, progression, and possible risk of aspiration in the setting of pneumonia and age. Wife verbalized understanding and expressed despite what work-up or findings are, patient enjoyed food (despite his thin stature) and she would not want to deprive him of this at 87 years old. Support provided.   Mrs. Finigan verbalized her understanding of patient's current condition. She shares family is realistic and understands he is 87 years old. She shares if patient was to have had a stroke they would not have wanted aggressive work-up or interventions given his age.   I attempted to elicit values and goals of care important to the patient.    The difference between aggressive medical intervention and comfort care was considered in light of the patient's goals of care. I educated wife on what comfort care measures would look like as she asked appropriate questions.   Advanced directives, concepts specific to code  status, artifical feeding and hydration, and rehospitalization were considered and discussed. Patient dies have a documented advanced directive. Wife shares patient has made known his wishes on multiple occassions.  Mrs. Zirbes confirms DNR/DNI. No artificial feedings, dialysis, or  surgical interventions.   Wife is clear in expressing wishes to continue to treat the treatable. No escalation of care or aggressive interventions. Understands patient's advanced age. Family is hopeful once care is medically optimized he may return home with family support. They are not interested in any type of facility placement. She reports patient was working with a therapist in the home and this was going fairly well. Wishes for their assistance to continue at discharge.   Hospice and Palliative Care services outpatient were explained and offered. Wife verbalized her understanding and awareness of both palliative and hospice's goals and philosophy of care. Mrs. Drost is requesting outpatient palliative support. She acknowledges possible need for hospice and would like to initiate palliative until established in the home and will further discuss with the outpatient team regarding transitioning care to a more hospice focus.   Questions and concerns were addressed.  The family was encouraged to call with questions or concerns.  PMT will continue to support holistically.   SOCIAL HISTORY:     reports that he quit smoking about 37 years ago. His smoking use included cigarettes. He has a 60.00 pack-year smoking history. He has never used smokeless tobacco. He reports that he does not drink alcohol and does not use drugs.  CODE STATUS: DNR  ADVANCE DIRECTIVES: Primary Decision Maker: Williemae Area (wife)    SYMPTOM MANAGEMENT: per attending   Palliative Prophylaxis:   Aspiration, Bowel Regimen, Delirium Protocol, Frequent Pain Assessment and Oral Care  PSYCHO-SOCIAL/SPIRITUAL:  Support System: Family  Desire for further Chaplaincy support: Not addressed   Additional Recommendations (Limitations, Scope, Preferences):  No Artificial Feeding, No Surgical Procedures and Treat the treatable, no escalation in care, DNR  Education on hospice/palliative    PAST MEDICAL HISTORY: Past  Medical History:  Diagnosis Date  . BPH (benign prostatic hypertrophy)   . Cancer ([place])    colon  . Dementia ([place])   . DVT (deep venous thrombosis) ([place])    with pulmonary embo.  Marland Kitchen Hyperlipidemia   . Hypertension     ALLERGIES:  is allergic to penicillins.   MEDICATIONS:  Current Facility-Administered Medications  Medication Dose Route Frequency Provider Last Rate Last Admin  . 0.9 %  sodium chloride infusion   Intravenous Continuous Marylyn Ishihara, Tyrone A, DO   Stopped at 04/18/20 0410  . acetaminophen (TYLENOL) tablet 650 mg  650 mg Oral Q6H PRN Marylyn Ishihara, Tyrone A, DO   650 mg at 04/18/20 9562   Or  . acetaminophen (TYLENOL) suppository 650 mg  650 mg Rectal Q6H PRN Marylyn Ishihara, Tyrone A, DO      . cefTRIAXone (ROCEPHIN) 1 g in sodium chloride 0.9 % 100 mL IVPB  1 g Intravenous Q24H Kyle, Tyrone A, DO 200 mL/hr at 04/18/20 0851 1 g at 04/18/20 0851  . doxazosin (CARDURA) tablet 2 mg  2 mg Oral Daily Kyle, Tyrone A, DO   2 mg at 04/18/20 0841  . hydrOXYzine (VISTARIL) injection 25 mg  25 mg Intramuscular Q6H PRN Lovey Newcomer T, NP   25 mg at 04/18/20 0245  . hydrOXYzine (VISTARIL) injection 25 mg  25 mg Intramuscular Q6H PRN Irene Pap N, DO      . ondansetron (ZOFRAN) tablet 4 mg  4 mg Oral Q6H PRN  Marylyn Ishihara, Tyrone A, DO       Or  . ondansetron (ZOFRAN) injection 4 mg  4 mg Intravenous Q6H PRN Marylyn Ishihara, Tyrone A, DO      . QUEtiapine (SEROQUEL) tablet 12.5 mg  12.5 mg Oral BID Irene Pap N, DO   12.5 mg at 04/18/20 1308  . senna (SENOKOT) tablet 8.6 mg  1 tablet Oral Daily Kyle, Tyrone A, DO   8.6 mg at 04/18/20 0841    VITAL SIGNS: BP 123/69 (BP Location: Left Arm)   Pulse 85   Temp (!) 97.4 F (36.3 C) (Oral)   Resp 14   Ht 5\' 5"  (1.651 m)   Wt 53.7 kg   SpO2 97%   BMI 19.70 kg/m  Filed Weights   04/22/2020 1007  Weight: 53.7 kg    Estimated body mass index is 19.7 kg/m as calculated from the following:   Height as of this encounter: 5\' 5"  (1.651 m).   Weight as of this encounter: 53.7  kg.  LABS: CBC:    Component Value Date/Time   WBC 12.6 (H) 04/18/2020 0800   HGB 11.2 (L) 04/18/2020 0800   HCT 34.2 (L) 04/18/2020 0800   PLT 197 04/18/2020 0800   Comprehensive Metabolic Panel:    Component Value Date/Time   NA 137 04/18/2020 0800   K 4.4 04/18/2020 0800   BUN 31 (H) 04/18/2020 0800   CREATININE 1.44 (H) 04/18/2020 0800   ALBUMIN 3.0 (L) 04/18/2020 0800     Review of Systems  Unable to perform ROS: Dementia     Prognosis: Guarded-Poor   Discharge Planning:  Home with Palliative  Recommendations: . DNR/DNI-as confirmed by wife . Continue with current plan of care  . No artificial feedings, no aggressive interventions or escalation of care . Discussed at length with wife. She is realistic in his advanced age (wife is 14) and his co-morbidities. Clear in family's expressed wishes to treat the treatable and once medically stable hopeful patient may discharge home with family. Not interested in facility. Reports patient was working with Physical Therapist in the home.  . Wife requesting outpatient Palliative support with intentions to transition care to focus on comfort/hospice if further declines. (TOC referral placed to assist with request) . PMT will continue to support and follow as needed. Please call team line with urgent needs.   Palliative Performance Scale: PPS 30%               Wife, [name] expressed understanding and was in agreement with this plan.   Thank you for allowing the Palliative Medicine Team to assist in the care of this patient.  Time In: 1300 Time Out: 1400 Time Total: 60 min.   Visit consisted of counseling and education dealing with the complex and emotionally intense issues of symptom management and palliative care in the setting of serious and potentially life-threatening illness.Greater than 50%  of this time was spent counseling and coordinating care related to the above assessment and plan.  Signed by:  Alda Lea, AGPCNP-BC Palliative Medicine Team  Phone: [phone] Pager: [phone] Amion: Bjorn Pippin

## 2020-04-18 NOTE — Progress Notes (Signed)
Patient anxious and restless, trying to fight staff. MD paged.

## 2020-04-18 NOTE — Progress Notes (Signed)
Patient climbing out of bed. Difficult to redirect. Pulling at IV lines. Will continue to monitor.

## 2020-04-18 NOTE — Plan of Care (Signed)
  Problem: Clinical Measurements: Goal: Will remain free from infection Outcome: Progressing Goal: Respiratory complications will improve Outcome: Progressing   Problem: Safety: Goal: Ability to remain free from injury will improve Outcome: Progressing   Problem: Skin Integrity: Goal: Risk for impaired skin integrity will decrease Outcome: Progressing   

## 2020-04-19 DIAGNOSIS — I452 Bifascicular block: Secondary | ICD-10-CM | POA: Diagnosis present

## 2020-04-19 DIAGNOSIS — Z8673 Personal history of transient ischemic attack (TIA), and cerebral infarction without residual deficits: Secondary | ICD-10-CM | POA: Diagnosis not present

## 2020-04-19 DIAGNOSIS — I129 Hypertensive chronic kidney disease with stage 1 through stage 4 chronic kidney disease, or unspecified chronic kidney disease: Secondary | ICD-10-CM | POA: Diagnosis present

## 2020-04-19 DIAGNOSIS — Z515 Encounter for palliative care: Secondary | ICD-10-CM | POA: Diagnosis not present

## 2020-04-19 DIAGNOSIS — N400 Enlarged prostate without lower urinary tract symptoms: Secondary | ICD-10-CM | POA: Diagnosis present

## 2020-04-19 DIAGNOSIS — G928 Other toxic encephalopathy: Secondary | ICD-10-CM | POA: Diagnosis present

## 2020-04-19 DIAGNOSIS — J690 Pneumonitis due to inhalation of food and vomit: Secondary | ICD-10-CM | POA: Diagnosis present

## 2020-04-19 DIAGNOSIS — Z87891 Personal history of nicotine dependence: Secondary | ICD-10-CM | POA: Diagnosis not present

## 2020-04-19 DIAGNOSIS — R64 Cachexia: Secondary | ICD-10-CM | POA: Diagnosis present

## 2020-04-19 DIAGNOSIS — J189 Pneumonia, unspecified organism: Secondary | ICD-10-CM | POA: Diagnosis not present

## 2020-04-19 DIAGNOSIS — R1310 Dysphagia, unspecified: Secondary | ICD-10-CM | POA: Diagnosis present

## 2020-04-19 DIAGNOSIS — R4182 Altered mental status, unspecified: Secondary | ICD-10-CM | POA: Diagnosis not present

## 2020-04-19 DIAGNOSIS — Z85038 Personal history of other malignant neoplasm of large intestine: Secondary | ICD-10-CM | POA: Diagnosis not present

## 2020-04-19 DIAGNOSIS — N1831 Chronic kidney disease, stage 3a: Secondary | ICD-10-CM | POA: Diagnosis present

## 2020-04-19 DIAGNOSIS — F0390 Unspecified dementia without behavioral disturbance: Secondary | ICD-10-CM | POA: Diagnosis present

## 2020-04-19 DIAGNOSIS — N170 Acute kidney failure with tubular necrosis: Secondary | ICD-10-CM | POA: Diagnosis present

## 2020-04-19 DIAGNOSIS — R5381 Other malaise: Secondary | ICD-10-CM | POA: Diagnosis present

## 2020-04-19 DIAGNOSIS — Z681 Body mass index (BMI) 19 or less, adult: Secondary | ICD-10-CM | POA: Diagnosis not present

## 2020-04-19 DIAGNOSIS — C61 Malignant neoplasm of prostate: Secondary | ICD-10-CM | POA: Diagnosis present

## 2020-04-19 DIAGNOSIS — Z79899 Other long term (current) drug therapy: Secondary | ICD-10-CM | POA: Diagnosis not present

## 2020-04-19 DIAGNOSIS — R6520 Severe sepsis without septic shock: Secondary | ICD-10-CM | POA: Diagnosis present

## 2020-04-19 DIAGNOSIS — G9341 Metabolic encephalopathy: Secondary | ICD-10-CM | POA: Diagnosis not present

## 2020-04-19 DIAGNOSIS — E785 Hyperlipidemia, unspecified: Secondary | ICD-10-CM | POA: Diagnosis present

## 2020-04-19 DIAGNOSIS — A419 Sepsis, unspecified organism: Secondary | ICD-10-CM | POA: Diagnosis present

## 2020-04-19 DIAGNOSIS — Z880 Allergy status to penicillin: Secondary | ICD-10-CM | POA: Diagnosis not present

## 2020-04-19 DIAGNOSIS — D631 Anemia in chronic kidney disease: Secondary | ICD-10-CM | POA: Diagnosis present

## 2020-04-19 DIAGNOSIS — Z66 Do not resuscitate: Secondary | ICD-10-CM | POA: Diagnosis present

## 2020-04-19 DIAGNOSIS — Z20822 Contact with and (suspected) exposure to covid-19: Secondary | ICD-10-CM | POA: Diagnosis present

## 2020-04-19 LAB — COMPREHENSIVE METABOLIC PANEL
ALT: 23 U/L (ref 0–44)
AST: 60 U/L — ABNORMAL HIGH (ref 15–41)
Albumin: 2.9 g/dL — ABNORMAL LOW (ref 3.5–5.0)
Alkaline Phosphatase: 80 U/L (ref 38–126)
Anion gap: 9 (ref 5–15)
BUN: 33 mg/dL — ABNORMAL HIGH (ref 8–23)
CO2: 21 mmol/L — ABNORMAL LOW (ref 22–32)
Calcium: 8.6 mg/dL — ABNORMAL LOW (ref 8.9–10.3)
Chloride: 108 mmol/L (ref 98–111)
Creatinine, Ser: 1.46 mg/dL — ABNORMAL HIGH (ref 0.61–1.24)
GFR, Estimated: 42 mL/min — ABNORMAL LOW (ref >60)
Glucose, Bld: 105 mg/dL — ABNORMAL HIGH (ref 70–99)
Potassium: 4.2 mmol/L (ref 3.5–5.1)
Sodium: 138 mmol/L (ref 135–145)
Total Bilirubin: 1 mg/dL (ref 0.3–1.2)
Total Protein: 5.9 g/dL — ABNORMAL LOW (ref 6.5–8.1)

## 2020-04-19 LAB — CBC
HCT: 33.2 % — ABNORMAL LOW (ref 39.0–52.0)
Hemoglobin: 10.8 g/dL — ABNORMAL LOW (ref 13.0–17.0)
MCH: 32.2 pg (ref 26.0–34.0)
MCHC: 32.5 g/dL (ref 30.0–36.0)
MCV: 99.1 fL (ref 80.0–100.0)
Platelets: 168 10*3/uL (ref 150–400)
RBC: 3.35 MIL/uL — ABNORMAL LOW (ref 4.22–5.81)
RDW: 15 % (ref 11.5–15.5)
WBC: 7.3 10*3/uL (ref 4.0–10.5)
nRBC: 0 % (ref 0.0–0.2)

## 2020-04-19 LAB — MAGNESIUM: Magnesium: 2 mg/dL (ref 1.7–2.4)

## 2020-04-19 LAB — PROCALCITONIN: Procalcitonin: 8.64 ng/mL

## 2020-04-19 LAB — PHOSPHORUS: Phosphorus: 2.4 mg/dL — ABNORMAL LOW (ref 2.5–4.6)

## 2020-04-19 MED ORDER — POTASSIUM & SODIUM PHOSPHATES 280-160-250 MG PO PACK
1.0000 | PACK | Freq: Three times a day (TID) | ORAL | Status: AC
  Administered 2020-04-19 – 2020-04-20 (×4): 1 via ORAL
  Filled 2020-04-19 (×4): qty 1

## 2020-04-19 NOTE — Evaluation (Signed)
Physical Therapy Evaluation Patient Details Name: Aldrich Lloyd MRN: [id number] DOB: 1915-06-23 Today's Date: 04/19/2020   History of Present Illness  Emerick Weatherly is a 87 y.o. male with medical history significant of dementia, HTN, BPH, TIAs, CKD3a. Presenting with generalized weakness and confusion. Found to have acute metabolic encephalopathy  Clinical Impression  Pt admitted with above diagnosis.  Pt easily arouses to voice. Able to transition to EOB with multi-modal cues, incr time and assist of 2.  Will likely need SNF unless cognition should improve significantly. Continue to follow    Pt currently with functional limitations due to the deficits listed below (see PT Problem List). Pt will benefit from skilled PT to increase their independence and safety with mobility to allow discharge to the venue listed below.       Follow Up Recommendations SNF    Equipment Recommendations  None recommended by PT    Recommendations for Other Services       Precautions / Restrictions Precautions Precautions: Fall Restrictions Weight Bearing Restrictions: No      Mobility  Bed Mobility Overal bed mobility: Needs Assistance Bed Mobility: Supine to Sit;Sit to Supine     Supine to sit: Mod assist;+2 for physical assistance;+2 for safety/equipment Sit to supine: Mod assist;+2 for physical assistance;+2 for safety/equipment   General bed mobility comments: pt initiated sitting on L side of bed, then returned to supine, initiated R side of bed and bale to complete with assist for LEs and trunk    Transfers Overall transfer level: Needs assistance Equipment used: 2 person hand held assist Transfers: Sit to/from Stand Sit to Stand: Mod assist;Max assist         General transfer comment: +2 assist for anterior - superior wt shift. posterior bias in standing. assist for controlled descent to bed  Ambulation/Gait             General Gait Details: unable  Stairs             Wheelchair Mobility    Modified Rankin (Stroke Patients Only)       Balance Overall balance assessment: Needs assistance Sitting-balance support: Feet supported;Single extremity supported Sitting balance-Leahy Scale: Fair Sitting balance - Comments: able to static sit with min/guard for safety, tending to lift feet off floor adn lean posteriorly     Standing balance-Leahy Scale: Zero Standing balance comment: reliant on UEs and external support                             Pertinent Vitals/Pain Pain Assessment: Faces Faces Pain Scale: No hurt Pain Intervention(s): Monitored during session    Home Living Family/patient expects to be discharged to:: Unsure Living Arrangements: Spouse/significant other Available Help at Discharge: Family Type of Home: House Home Access: Stairs to enter Entrance Stairs-Rails: None Entrance Stairs-Number of Steps: 1 Home Layout: One level Home Equipment: Walker - 4 wheels Additional Comments: pt has dementia and is poor historian, PLOF and home enviroment obtained from previous admission notes from ~2 wks ago. also per previous notes-->Pt's wife reports that he only walks around in the home to go to the bathroom and to kitchen for dinner, otherwise sits in his chair throughout the day.    Prior Function Level of Independence: Needs assistance   Gait / Transfers Assistance Needed: per pt's wife HHA when ambulating around the house           Hand Dominance  Extremity/Trunk Assessment   Upper Extremity Assessment Upper Extremity Assessment: Defer to OT evaluation    Lower Extremity Assessment Lower Extremity Assessment: Generalized weakness       Communication      Cognition Arousal/Alertness: Awake/alert Behavior During Therapy: Restless Overall Cognitive Status: History of cognitive impairments - at baseline                                        General Comments       Exercises     Assessment/Plan    PT Assessment Patient needs continued PT services  PT Problem List Decreased strength;Decreased balance;Decreased mobility;Decreased cognition;Decreased knowledge of use of DME;Decreased safety awareness;Decreased activity tolerance       PT Treatment Interventions DME instruction;Gait training;Functional mobility training;Therapeutic activities;Therapeutic exercise;Balance training;Patient/family education    PT Goals (Current goals can be found in the Care Plan section)  Acute Rehab PT Goals Patient Stated Goal: unable to state PT Goal Formulation: Patient unable to participate in goal setting Time For Goal Achievement: 05/03/20 Potential to Achieve Goals: Fair    Frequency Min 2X/week   Barriers to discharge        Co-evaluation               AM-PAC PT "6 Clicks" Mobility  Outcome Measure Help needed turning from your back to your side while in a flat bed without using bedrails?: A Lot Help needed moving from lying on your back to sitting on the side of a flat bed without using bedrails?: A Lot Help needed moving to and from a bed to a chair (including a wheelchair)?: Total Help needed standing up from a chair using your arms (e.g., wheelchair or bedside chair)?: A Lot Help needed to walk in hospital room?: Total Help needed climbing 3-5 steps with a railing? : Total 6 Click Score: 9    End of Session Equipment Utilized During Treatment: Gait belt Activity Tolerance: Other (comment) (limited by cognition)     PT Visit Diagnosis: Unsteadiness on feet (R26.81);Muscle weakness (generalized) (M62.81);Other abnormalities of gait and mobility (R26.89)    Time: 3159-4585 PT Time Calculation (min) (ACUTE ONLY): 19 min   Charges:   PT Evaluation $PT Eval Moderate Complexity: 1 Mod          Jacquel Mccamish, PT  Acute Rehab Dept ([facility]) [phone] Pager [phone]  04/19/2020   [facility] 04/19/2020, 4:34 PM

## 2020-04-19 NOTE — Progress Notes (Signed)
PROGRESS NOTE  Howard Padilla [id number]:[id number] DOB: 1915-05-22 DOA: 04/09/2020 PCP: Isaac Bliss, Rayford Halsted, MD  HPI/Recap of past 24 hours: Howard Padilla is a 87 y.o. male with medical history significant of HTN, BPH, TIAs, CVAs, CKD3a. Presenting with generalized weakness and confusion. History from wife as patient is confused. She reports that before he went to bed, the patient was normal yesterday. Last night he was restless and continued getting up and calling his wife's name. This morning when she went to check on him, he was confused. He wasn't acting like himself. He didn't recognize his wife. He wasn't able to speak and he was unable to get out of bed to his chair like he normally does. His wife became concerned and called for EMS.  In the ED, patient was started on sepsis protocol. CXR showed questionable PNA. [facility] was called for admission.   04/19/20: Seen and examined at bedside. Pleasantly confused. He denies having any pain. He is currently on IV fluid and on IV antibiotics empirically to cover community-acquired pneumonia.  He has no new complaints.  He is pleasantly confused.  Need to rule out dysphagia with aspiration in the setting of prior strokes. Speech therapist consulted for swallow evaluation. Aspiration precautions in place.   Assessment/Plan: Active Problems:   Acute metabolic encephalopathy  Sepsis, improving, secondary to community-acquired pneumonia, POA Presented with leukocytosis, tachypnea with respiration rate of 26, infiltrates on chest x-ray indicative of pneumonia. Procalcitonin greater than 12 Personally reviewed chest x-ray done on 04/18/2020 showing bilateral infiltrates mainly affecting the right mid and lower lobes. Blood cultures negative to date. Continue to follow. Started on Rocephin empirically from the ED, continue. Obtain sputum culture. Leukocytosis has resolved Procalcitonin downtrending from 12 to 8. Afebrile  Persistent acute  metabolic encephalopathy in the setting of history of multiple strokes, acute illness, sepsis. CT head on admission nonacute. Reorient as needed Fall precautions, aspiration cautions, delirium precautions. Soft mittens in place for patient's own safety.  Suspected dysphagia with possible aspiration Speech therapist to evaluate Aspiration precautions in place  History of multiple strokes No acute intracranial findings from CT head on 04/24/2020. Continue to monitor.  Essential hypertension BP is currently soft in the setting of sepsis Continue to maintain MAP greater than 65 Continue to hold off home oral antihypertensives. Continue gentle IV fluid hydration. Continue to closely monitor vital signs.  BPH Continue home cardura Monitor UO  Chronic constipation Resume home bowel regimen.  Physical debility PT OT to assess Family is planning on returning to home with home health services Fall precautions.   Code Status: DNR.  Family Communication: None at bedside.  Disposition Plan: Likely discharge to home with home health services on 2020-04-20.   Consultants:  None.  Procedures:  None.  Antimicrobials:  Rocephin.  DVT prophylaxis: Subcu Lovenox daily.  Status is: Observation    Dispo:  Patient From: Home  Planned Disposition: Home with Health Care Svc  Expected discharge date: 04/21/2020  Medically stable for discharge: No, ongoing management of sepsis secondary to pneumonia.       Objective: Vitals:   04/18/20 1347 04/18/20 2114 04/19/20 0655 04/19/20 1315  BP: 123/69 (!) 145/78 (!) 149/87 (!) 147/88  Pulse: 85 84 82 82  Resp: 14 17 16 17   Temp: (!) 97.4 F (36.3 C) (!) 97.4 F (36.3 C) 97.8 F (36.6 C) 98 F (36.7 C)  TempSrc: Oral     SpO2: 97% 98% 95% 100%  Weight:  Height:        Intake/Output Summary (Last 24 hours) at 04/19/2020 1831 Last data filed at 04/19/2020 1824 Gross per 24 hour  Intake 360 ml  Output --  Net 360  ml   Filed Weights   04/14/2020 1007  Weight: 53.7 kg    Exam:  . General: 87 y.o. year-old male Thin, frail in no acute distress.  Alert and pleasantly confused.   . Cardiovascular: Regular rate and rhythm no rubs or gallops.   Marland Kitchen Respiratory: Mild rales at bases no wheezing noted.  Poor inspiratory effort.   . Abdomen: Soft nontender normal bowel sounds present.   . Musculoskeletal: No lower extremity edema bilaterally. Marland Kitchen Psychiatry: Unable to assess mood due to confusion.   Data Reviewed: CBC: Recent Labs  Lab 04/04/2020 0959 04/18/20 0800 04/19/20 0906  WBC 8.0 12.6* 7.3  NEUTROABS 7.6  --   --   HGB 10.1* 11.2* 10.8*  HCT 30.5* 34.2* 33.2*  MCV 98.1 99.4 99.1  PLT 187 197 637   Basic Metabolic Panel: Recent Labs  Lab 04/13/2020 0959 04/18/20 0800 04/19/20 0906  NA 139 137 138  K 4.3 4.4 4.2  CL 107 107 108  CO2 22 19* 21*  GLUCOSE 91 65* 105*  BUN 32* 31* 33*  CREATININE 1.47* 1.44* 1.46*  CALCIUM 8.5* 8.8* 8.6*  MG  --   --  2.0  PHOS  --   --  2.4*   GFR: Estimated Creatinine Clearance: 19.4 mL/min (A) (by C-G formula based on SCr of 1.46 mg/dL (H)). Liver Function Tests: Recent Labs  Lab 04/02/2020 0959 04/18/20 0800 04/19/20 0906  AST 23 45* 60*  ALT 16 20 23   ALKPHOS 83 82 80  BILITOT 0.9 1.0 1.0  PROT 6.2* 6.3* 5.9*  ALBUMIN 3.2* 3.0* 2.9*   No results for input(s): LIPASE, AMYLASE in the last 168 hours. No results for input(s): AMMONIA in the last 168 hours. Coagulation Profile: Recent Labs  Lab 04/21/2020 0959  INR 1.3*   Cardiac Enzymes: No results for input(s): CKTOTAL, CKMB, CKMBINDEX, TROPONINI in the last 168 hours. BNP (last 3 results) No results for input(s): PROBNP in the last 8760 hours. HbA1C: No results for input(s): HGBA1C in the last 72 hours. CBG: No results for input(s): GLUCAP in the last 168 hours. Lipid Profile: No results for input(s): CHOL, HDL, LDLCALC, TRIG, CHOLHDL, LDLDIRECT in the last 72 hours. Thyroid  Function Tests: No results for input(s): TSH, T4TOTAL, FREET4, T3FREE, THYROIDAB in the last 72 hours. Anemia Panel: No results for input(s): VITAMINB12, FOLATE, FERRITIN, TIBC, IRON, RETICCTPCT in the last 72 hours. Urine analysis:    Component Value Date/Time   COLORURINE YELLOW 04/05/2020 [place] 04/06/2020 1145   LABSPEC 1.012 04/24/2020 1145   PHURINE 6.0 04/26/2020 1145   GLUCOSEU NEGATIVE 04/22/2020 1145   HGBUR NEGATIVE 04/16/2020 1145   BILIRUBINUR NEGATIVE 04/07/2020 1145   KETONESUR NEGATIVE 04/24/2020 1145   PROTEINUR NEGATIVE 04/06/2020 1145   UROBILINOGEN 0.2 01/06/2015 0927   NITRITE NEGATIVE 04/10/2020 1145   LEUKOCYTESUR NEGATIVE 03/31/2020 1145   Sepsis Labs: @LABRCNTIP (procalcitonin:4,lacticidven:4)  ) Recent Results (from the past 240 hour(s))  Blood Culture (routine x 2)     Status: None (Preliminary result)   Collection Time: 04/09/2020  9:46 AM   Specimen: BLOOD  Result Value Ref Range Status   Specimen Description   Final    BLOOD BLOOD LEFT FOREARM Performed at [facility], [address]  [place], [place] [address]    Special Requests   Final    BOTTLES DRAWN AEROBIC AND ANAEROBIC Blood Culture adequate volume Performed at [facility] [address], [place], [place] [address]    Culture   Final    NO GROWTH 2 DAYS Performed at [facility] [address] [place], [place] [address]    Report Status PENDING  Incomplete  Resp Panel by RT-PCR (Flu A&B, Covid) Nasopharyngeal Swab     Status: None   Collection Time: 04/24/2020 11:30 AM   Specimen: Nasopharyngeal Swab; Nasopharyngeal(NP) swabs in vial transport medium  Result Value Ref Range Status   SARS Coronavirus 2 by RT PCR NEGATIVE NEGATIVE Final    Comment: (NOTE) SARS-CoV-2 target nucleic acids are NOT DETECTED.  The SARS-CoV-2 RNA is generally detectable in upper respiratory specimens during the acute phase of infection. The  lowest concentration of SARS-CoV-2 viral copies this assay can detect is 138 copies/mL. A negative result does not preclude SARS-Cov-2 infection and should not be used as the sole basis for treatment or other patient management decisions. A negative result may occur with  improper specimen collection/handling, submission of specimen other than nasopharyngeal swab, presence of viral mutation(s) within the areas targeted by this assay, and inadequate number of viral copies(<138 copies/mL). A negative result must be combined with clinical observations, patient history, and epidemiological information. The expected result is Negative.  Fact Sheet for Patients:  EntrepreneurPulse.com.au  Fact Sheet for Healthcare Providers:  IncredibleEmployment.be  This test is no t yet approved or cleared by the [place] FDA and  has been authorized for detection and/or diagnosis of SARS-CoV-2 by FDA under an Emergency Use Authorization (EUA). This EUA will remain  in effect (meaning this test can be used) for the duration of the COVID-19 declaration under Section 564(b)(1) of the Act, 21 U.S.C.section 360bbb-3(b)(1), unless the authorization is terminated  or revoked sooner.       Influenza A by PCR NEGATIVE NEGATIVE Final   Influenza B by PCR NEGATIVE NEGATIVE Final    Comment: (NOTE) The Xpert Xpress SARS-CoV-2/FLU/RSV plus assay is intended as an aid in the diagnosis of influenza from Nasopharyngeal swab specimens and should not be used as a sole basis for treatment. Nasal washings and aspirates are unacceptable for Xpert Xpress SARS-CoV-2/FLU/RSV testing.  Fact Sheet for Patients: EntrepreneurPulse.com.au  Fact Sheet for Healthcare Providers: IncredibleEmployment.be  This test is not yet approved or cleared by the [place] FDA and has been authorized for detection and/or diagnosis of SARS-CoV-2 by FDA under  an Emergency Use Authorization (EUA). This EUA will remain in effect (meaning this test can be used) for the duration of the COVID-19 declaration under Section 564(b)(1) of the Act, 21 U.S.C. section 360bbb-3(b)(1), unless the authorization is terminated or revoked.  Performed at [facility], [place] [address], [place], [place] [address]   Urine culture     Status: None   Collection Time: 04/26/2020 11:45 AM   Specimen: In/Out Cath Urine  Result Value Ref Range Status   Specimen Description   Final    IN/OUT CATH URINE Performed at [facility] [address], [place], [place] [address]    Special Requests   Final    NONE Performed at [facility], [place] [address] [place],  [address]    Culture   Final    NO GROWTH Performed at [facility], [place] [address], [place], [place]  [address]    Report Status 04/18/2020 FINAL  Final  MRSA PCR Screening     Status: None   Collection Time: 04/18/20  4:20 PM   Specimen: Nasopharyngeal  Result Value Ref Range Status   MRSA by PCR NEGATIVE NEGATIVE Final    Comment:        The GeneXpert MRSA Assay (FDA approved for NASAL specimens only), is one component of a comprehensive MRSA colonization surveillance program. It is not intended to diagnose MRSA infection nor to guide or monitor treatment for MRSA infections. Performed at [facility], [place] [address] [place], [place] [address]       Studies: No results found.  Scheduled Meds: . doxazosin  2 mg Oral Daily  . enoxaparin (LOVENOX) injection  30 mg Subcutaneous Q24H  . polyethylene glycol  17 g Oral Daily  . potassium & sodium phosphates  1 packet Oral TID WC & HS  . QUEtiapine  12.5 mg Oral BID  . senna  1 tablet Oral Daily    Continuous Infusions: . sodium chloride 75 mL/hr at 04/18/20 1745  . cefTRIAXone (ROCEPHIN)  IV 1 g (04/19/20 0917)     LOS: 0 days     Kayleen Memos,  MD [facility] Pager [phone]  If 7PM-7AM, please contact night-coverage [email or web address] Password [facility] 04/19/2020, 6:31 PM

## 2020-04-19 NOTE — Evaluation (Signed)
Occupational Therapy Evaluation Patient Details Name: Howard Padilla MRN: [id number] DOB: 07-02-1915 Today's Date: 04/19/2020    History of Present Illness Howard Padilla is a 87 y.o. male with medical history significant of dementia, HTN, BPH, TIAs, CKD3a. Presenting with generalized weakness and confusion. Found to have acute metabolic encephalopathy   Clinical Impression   This 87 yo male admitted with above presents to acute OT with PLOF unknown (pt with decreased cognition and no family available in room). Currently pt is total A for all basic ADLs. He will benefit from acute OT with recommended followup at SNF.    Follow Up Recommendations  SNF;Supervision/Assistance - 24 hour    Equipment Recommendations  Other (comment) (TBD next venue)       Precautions / Restrictions Precautions Precautions: Fall      Mobility Bed Mobility Overal bed mobility: Needs Assistance Bed Mobility: Supine to Sit;Sit to Supine     Supine to sit: Mod assist Sit to supine: Mod assist        Transfers Overall transfer level: Needs assistance Equipment used: 1 person hand held assist Transfers: Sit to/from Stand Sit to Stand: Max assist         General transfer comment: hunched posture with posterior lean bias    Balance Overall balance assessment: Needs assistance Sitting-balance support: Feet supported;Single extremity supported Sitting balance-Leahy Scale: Fair Sitting balance - Comments: able to static sit with min/guard for safety intermittently otherwise   Standing balance support: Bilateral upper extremity supported Standing balance-Leahy Scale: Zero                             ADL either performed or assessed with clinical judgement   ADL                                         General ADL Comments: total A due to cognition                  Pertinent Vitals/Pain Pain Assessment: Faces Faces Pain Scale: No hurt     Hand  Dominance Right   Extremity/Trunk Assessment Upper Extremity Assessment Upper Extremity Assessment: Generalized weakness              Cognition Arousal/Alertness: Awake/alert Behavior During Therapy: Restless Overall Cognitive Status: History of cognitive impairments - at baseline (but unsure if worse than baseline) Area of Impairment: Orientation;Following commands;Safety/judgement;Problem solving                 Orientation Level: Disoriented to;Place;Time;Situation     Following Commands:  (not following commands) Safety/Judgement: Decreased awareness of safety;Decreased awareness of deficits   Problem Solving: Decreased initiation;Difficulty sequencing;Requires verbal cues;Slow processing;Requires tactile cues                Home Living Family/patient expects to be discharged to:: Unsure Living Arrangements: Spouse/significant other Available Help at Discharge: Family Type of Home: House Home Access: Stairs to enter Technical brewer of Steps: 1 Entrance Stairs-Rails: None Home Layout: One level     Bathroom Shower/Tub: Teacher, early years/pre: Standard Bathroom Accessibility: Yes   Home Equipment: Environmental consultant - 4 wheels   Additional Comments: pt has dementia and is poor historian, PLOF and home enviroment obtained from previous admission notes from ~2 wks ago. also per previous notes-->Pt's wife reports that he only walks around  in the home to go to the bathroom and to kitchen for dinner, otherwise sits in his chair throughout the day.      Prior Functioning/Environment Level of Independence: Needs assistance  Gait / Transfers Assistance Needed: per pt's wife HHA when ambulating around the house ADL's / Homemaking Assistance Needed: Pt completes toileting independently with supervision from spouse            OT Problem List: Impaired balance (sitting and/or standing);Decreased cognition;Decreased safety awareness      OT  Treatment/Interventions: Self-care/ADL training;DME and/or AE instruction;Patient/family education;Balance training    OT Goals(Current goals can be found in the care plan section) Acute Rehab OT Goals Patient Stated Goal: unable to state OT Goal Formulation: Patient unable to participate in goal setting Time For Goal Achievement: 05/03/20 Potential to Achieve Goals: Fair  OT Frequency: Min 2X/week              AM-PAC OT "6 Clicks" Daily Activity     Outcome Measure Help from another person eating meals?: Total Help from another person taking care of personal grooming?: Total Help from another person toileting, which includes using toliet, bedpan, or urinal?: Total Help from another person bathing (including washing, rinsing, drying)?: Total Help from another person to put on and taking off regular upper body clothing?: Total Help from another person to put on and taking off regular lower body clothing?: Total 6 Click Score: 6   End of Session Equipment Utilized During Treatment: Gait belt  Activity Tolerance:  (pt limited by confusion) Patient left: in bed;with call bell/phone within reach;with bed alarm set;with restraints reapplied (Bil hand mitts)  OT Visit Diagnosis: Unsteadiness on feet (R26.81);Other abnormalities of gait and mobility (R26.89);Other symptoms and signs involving cognitive function;Cognitive communication deficit (R41.841)                Time: 1655-3748 OT Time Calculation (min): 19 min Charges:  OT General Charges $OT Visit: 1 Visit OT Evaluation $OT Eval Moderate Complexity: 1 Mod  [place] [address], OTR/L Acute [employer] Pager [phone] Office [phone]     Almon Register 04/19/2020, 8:52 PM

## 2020-04-19 NOTE — Evaluation (Signed)
Clinical/Bedside Swallow Evaluation Patient Details  Name: Howard Padilla MRN: [id number] Date of Birth: 1916/02/23  Today's Date: 04/19/2020 Time: SLP Start Time (ACUTE ONLY): 27 SLP Stop Time (ACUTE ONLY): 0945 SLP Time Calculation (min) (ACUTE ONLY): 25 min  Past Medical History:  Past Medical History:  Diagnosis Date  . BPH (benign prostatic hypertrophy)   . Cancer ([place])    colon  . Dementia ([place])   . DVT (deep venous thrombosis) ([place])    with pulmonary embo.  Marland Kitchen Hyperlipidemia   . Hypertension    Past Surgical History:  Past Surgical History:  Procedure Laterality Date  . COLON SURGERY     partial colectomy  . HERNIA REPAIR     ingunial   HPI:  Patient is an 87 y.o. male with PMH: HLD, HTN, BPH, dementia, TIA's vs CVA's, DVT who presented to ED from home secondary to generalized weakness and confusion. Wife reported at time of admission that patient had c/o becoming quickly full during previous night's dinner. CT head did not reveal any acute intracranial abnormalities; CXR revealed patchy airspace opacity in right mid and lower lung, potentially progressive scarring versus lobar PNA.   Assessment / Plan / Recommendation Clinical Impression  Patient presents with a mild oropharyngeal dysphagia with likely impact from cognitive impairments (h/o dementia). Per review of patient's wife's reports in chart, he was apparently eating 3 meals a day as well as snacking in between meals. Patient has been confused and disoriented during this hospital admission and although he was awake, he was exhibiting what appeared to be visual hallucinations, no attempts to interact with SLP or RN in room with him. He was able to accept boluses of puree solids (with meds crushed in) and staw sips of water (thin consistency) without impairment noted in oral preparatory phase. Patient did exhibited delayed oral transit of puree solids and brief oral delay with thin liquids. Swallow initiation appeared  delayed however patient did not exhibit any immediate or delayed coughing or throat clearing. Voice remained clear throghout assessment. Plan to downgrade solids to puree consistency but continue with thin liquids. Will see how patient tolerates PO's and then determine if need for objective swallow test to r/o silent aspiration. Palliative care did see patient and discuss POC/GOC with his wife and so if objective swallow test is warranted, will check with Palliative care to ensure it aligns with patients POC. SLP Visit Diagnosis: Dysphagia, unspecified (R13.10)    Aspiration Risk  Mild aspiration risk    Diet Recommendation Dysphagia 1 (Puree);Thin liquid   Liquid Administration via: Cup;Straw Medication Administration: Crushed with puree Supervision: Full supervision/cueing for compensatory strategies;Staff to assist with self feeding Compensations: Minimize environmental distractions;Slow rate;Small sips/bites Postural Changes: Seated upright at 90 degrees    Other  Recommendations Oral Care Recommendations: Oral care BID   Follow up Recommendations Other (comment) (TBD, likely none)      Frequency and Duration min 2x/week  1 week       Prognosis Prognosis for Safe Diet Advancement: Good      Swallow Study   General Date of Onset: 04/13/2020 HPI: Patient is an 87 y.o. male with PMH: HLD, HTN, BPH, dementia, TIA's vs CVA's, DVT who presented to ED from home secondary to generalized weakness and confusion. Wife reported at time of admission that patient had c/o becoming quickly full during previous night's dinner. CT head did not reveal any acute intracranial abnormalities; CXR revealed patchy airspace opacity in right mid and lower lung, potentially progressive  scarring versus lobar PNA. Type of Study: Bedside Swallow Evaluation Previous Swallow Assessment: remote from 2019 (BSE) Diet Prior to this Study: Dysphagia 3 (soft);Thin liquids Temperature Spikes Noted: No Respiratory  Status: Room air History of Recent Intubation: No Behavior/Cognition: Alert;Cooperative;Confused;Distractible;Requires cueing Oral Cavity Assessment: Within Functional Limits Oral Care Completed by SLP: Recent completion by staff Oral Cavity - Dentition: Edentulous Vision: Impaired for self-feeding Self-Feeding Abilities: Total assist Patient Positioning: Upright in bed Baseline Vocal Quality: Normal Volitional Cough: Cognitively unable to elicit Volitional Swallow: Unable to elicit    Oral/Motor/Sensory Function Overall Oral Motor/Sensory Function: Other (comment) (limited assessment secondary to patient's difficulty participating/following directions) Lingual ROM: Reduced right;Reduced left Lingual Strength: Reduced   Ice Chips     Thin Liquid Thin Liquid: Impaired Presentation: Straw Pharyngeal  Phase Impairments: Suspected delayed Swallow    Nectar Thick     Honey Thick     Puree Puree: Impaired Oral Phase Impairments: Reduced lingual movement/coordination;Reduced labial seal Oral Phase Functional Implications: Prolonged oral transit Pharyngeal Phase Impairments: Suspected delayed Swallow   Solid     Solid: Not tested      Sonia Baller, MA, CCC-SLP Speech Therapy

## 2020-04-20 ENCOUNTER — Inpatient Hospital Stay (HOSPITAL_COMMUNITY): Payer: Medicare Other

## 2020-04-20 ENCOUNTER — Encounter (HOSPITAL_COMMUNITY): Payer: Self-pay | Admitting: Internal Medicine

## 2020-04-20 DIAGNOSIS — G9341 Metabolic encephalopathy: Secondary | ICD-10-CM | POA: Diagnosis not present

## 2020-04-20 LAB — COMPREHENSIVE METABOLIC PANEL
ALT: 28 U/L (ref 0–44)
AST: 57 U/L — ABNORMAL HIGH (ref 15–41)
Albumin: 2.9 g/dL — ABNORMAL LOW (ref 3.5–5.0)
Alkaline Phosphatase: 79 U/L (ref 38–126)
Anion gap: 10 (ref 5–15)
BUN: 34 mg/dL — ABNORMAL HIGH (ref 8–23)
CO2: 20 mmol/L — ABNORMAL LOW (ref 22–32)
Calcium: 8.6 mg/dL — ABNORMAL LOW (ref 8.9–10.3)
Chloride: 109 mmol/L (ref 98–111)
Creatinine, Ser: 1.81 mg/dL — ABNORMAL HIGH (ref 0.61–1.24)
GFR, Estimated: 33 mL/min — ABNORMAL LOW (ref >60)
Glucose, Bld: 136 mg/dL — ABNORMAL HIGH (ref 70–99)
Potassium: 4.2 mmol/L (ref 3.5–5.1)
Sodium: 139 mmol/L (ref 135–145)
Total Bilirubin: 0.8 mg/dL (ref 0.3–1.2)
Total Protein: 5.9 g/dL — ABNORMAL LOW (ref 6.5–8.1)

## 2020-04-20 LAB — CBC
HCT: 33.5 % — ABNORMAL LOW (ref 39.0–52.0)
Hemoglobin: 11.1 g/dL — ABNORMAL LOW (ref 13.0–17.0)
MCH: 31.7 pg (ref 26.0–34.0)
MCHC: 33.1 g/dL (ref 30.0–36.0)
MCV: 95.7 fL (ref 80.0–100.0)
Platelets: 179 10*3/uL (ref 150–400)
RBC: 3.5 MIL/uL — ABNORMAL LOW (ref 4.22–5.81)
RDW: 14.9 % (ref 11.5–15.5)
WBC: 5.8 10*3/uL (ref 4.0–10.5)
nRBC: 0 % (ref 0.0–0.2)

## 2020-04-20 LAB — PHOSPHORUS: Phosphorus: 3 mg/dL (ref 2.5–4.6)

## 2020-04-20 LAB — PROCALCITONIN: Procalcitonin: 5.47 ng/mL

## 2020-04-20 LAB — MAGNESIUM: Magnesium: 1.9 mg/dL (ref 1.7–2.4)

## 2020-04-20 MED ORDER — AMLODIPINE BESYLATE 5 MG PO TABS
5.0000 mg | ORAL_TABLET | Freq: Every day | ORAL | Status: DC
  Administered 2020-04-20: 5 mg via ORAL
  Filled 2020-04-20: qty 1

## 2020-04-20 MED ORDER — SODIUM CHLORIDE 0.9 % IV SOLN: INTRAVENOUS | Status: AC

## 2020-04-20 NOTE — Progress Notes (Signed)
PROGRESS NOTE  Jarrett Albor BWL:[id number] DOB: [birth year] DOA: 03/30/2020 PCP: Isaac Bliss, Rayford Halsted, MD  HPI/Recap of past 24 hours: Howard Padilla is a 87 y.o. male with medical history significant of HTN, BPH, TIAs, CVAs, CKD3a. Presenting from home with generalized weakness and confusion.  He wasn't acting like himself.  He didn't recognize his wife. He wasn't able to speak and he was unable to get out of bed to his chair like he normally does. His wife became concerned and called for EMS.  In the ED, patient was started on sepsis protocol due to concern for pneumonia on chest x-ray, leukocytosis, tachypnea, and procalcitonin greater than 12.  He had a CT head done on admission which showed no acute intracranial abnormalities.  Hospital course complicated by worsening confusion, dysphagia now on pured diet and thin liquid. He was assessed by PT OT with recommendation for SNF. Family is expecting return to home. Seen by palliative care team, likely will need outpatient palliative referral at discharge.  04/20/20: Seen and examined at his bedside. He is more confused today. We will repeat his CT head to rule out any acute intracranial abnormalities.   Assessment/Plan: Active Problems:   Acute metabolic encephalopathy  Sepsis, criteria has resolved, secondary to community-acquired pneumonia, POA Presented with leukocytosis, tachypnea with respiration rate of 26, infiltrates on chest x-ray indicative of pneumonia. Procalcitonin greater than 12, downtrending, 5.4. Personally reviewed chest x-ray done on 04/22/2020 showing bilateral infiltrates mainly affecting his right mid and lower lobes. Blood cultures negative to date. Continue to follow. Started on Rocephin empirically from the ED, continue. Obtain sputum culture if possible. Leukocytosis has resolved, afebrile.  Worsening acute metabolic encephalopathy in the setting of history of multiple strokes, acute illness, resolved  sepsis. CT head on admission 04/16/2020 nonacute. Repeat CT head on 04/20/2020 Continue to reorient as needed Continue fall precautions, aspiration cautions, delirium precautions. Soft mittens in place for patient's own safety.  Dysphagia with possible aspiration Speech therapist recommended dysphagia 1 diet, pure, with thin liquids. Continue aspiration precautions  History of multiple strokes No acute intracranial findings from CT head on 04/01/2020.  Essential hypertension BP is not at goal Restarting home Norvasc at lower dose 5 mg daily. Continue gentle IV fluid hydration Continue to monitor vital signs.  BPH Continue home cardura Monitor UO  Chronic constipation Continue home bowel regimen.  Physical debility PT OT assessed and recommending SNF. Family is planning on returning to home with home health services Appreciate TOC's assistance with arrangements. Continue fall precautions.   Code Status: DNR.  Family Communication: Updated his wife via phone  Disposition Plan: Likely discharge to home with home health services on 2020-04-21.   Consultants:  None.  Procedures:  None.  Antimicrobials:  Rocephin.  DVT prophylaxis: Subcu Lovenox daily.  Status is: Inpatient    Dispo:  Patient From: Home  Planned Disposition: Home with Health Care Svc  Expected discharge date:  04/21/2020.  Medically stable for discharge: No, ongoing management of pneumonia and worsening acute metabolic encephalopathy.       Objective: Vitals:   04/19/20 1315 04/19/20 2035 04/20/20 0238 04/20/20 1324  BP: (!) 147/88 (!) 177/92 (!) 150/72 (!) 145/80  Pulse: 82 72 92 86  Resp: 17 15 (!) 21 14  Temp: 98 F (36.7 C) (!) 97.4 F (36.3 C) 97.7 F (36.5 C) 97.7 F (36.5 C)  TempSrc:  Oral Oral   SpO2: 100% 97% 96% 100%  Weight:      Height:  Intake/Output Summary (Last 24 hours) at 04/20/2020 1507 Last data filed at 04/20/2020 2595 Gross per 24 hour  Intake  1122.75 ml  Output -  Net 1122.75 ml   Filed Weights   04/21/2020 1007  Weight: 53.7 kg    Exam:  . General: 87 y.o. year-old male thin frail in no acute distress. Alert and confused.  . Cardiovascular: Regular rate and rhythm no rubs or gallops. Marland Kitchen Respiratory: Mild rales at bases no wheezing noted. Poor inspiratory effort. . Abdomen: Soft nontender normal bowel sounds present. . Musculoskeletal: Trace lower extremity edema bilaterally. Marland Kitchen Psychiatry: Unable to assess mood due to confusion.  Data Reviewed: CBC: Recent Labs  Lab 04/22/2020 0959 04/18/20 0800 04/19/20 0906 04/20/20 0812  WBC 8.0 12.6* 7.3 5.8  NEUTROABS 7.6  --   --   --   HGB 10.1* 11.2* 10.8* 11.1*  HCT 30.5* 34.2* 33.2* 33.5*  MCV 98.1 99.4 99.1 95.7  PLT 187 197 168 638   Basic Metabolic Panel: Recent Labs  Lab 04/08/2020 0959 04/18/20 0800 04/19/20 0906 04/20/20 0812  NA 139 137 138 139  K 4.3 4.4 4.2 4.2  CL 107 107 108 109  CO2 22 19* 21* 20*  GLUCOSE 91 65* 105* 136*  BUN 32* 31* 33* 34*  CREATININE 1.47* 1.44* 1.46* 1.81*  CALCIUM 8.5* 8.8* 8.6* 8.6*  MG  --   --  2.0 1.9  PHOS  --   --  2.4* 3.0   GFR: Estimated Creatinine Clearance: 15.7 mL/min (A) (by C-G formula based on SCr of 1.81 mg/dL (H)). Liver Function Tests: Recent Labs  Lab 04/13/2020 0959 04/18/20 0800 04/19/20 0906 04/20/20 0812  AST 23 45* 60* 57*  ALT 16 20 23 28   ALKPHOS 83 82 80 79  BILITOT 0.9 1.0 1.0 0.8  PROT 6.2* 6.3* 5.9* 5.9*  ALBUMIN 3.2* 3.0* 2.9* 2.9*   No results for input(s): LIPASE, AMYLASE in the last 168 hours. No results for input(s): AMMONIA in the last 168 hours. Coagulation Profile: Recent Labs  Lab 04/25/2020 0959  INR 1.3*   Cardiac Enzymes: No results for input(s): CKTOTAL, CKMB, CKMBINDEX, TROPONINI in the last 168 hours. BNP (last 3 results) No results for input(s): PROBNP in the last 8760 hours. HbA1C: No results for input(s): HGBA1C in the last 72 hours. CBG: No results for  input(s): GLUCAP in the last 168 hours. Lipid Profile: No results for input(s): CHOL, HDL, LDLCALC, TRIG, CHOLHDL, LDLDIRECT in the last 72 hours. Thyroid Function Tests: No results for input(s): TSH, T4TOTAL, FREET4, T3FREE, THYROIDAB in the last 72 hours. Anemia Panel: No results for input(s): VITAMINB12, FOLATE, FERRITIN, TIBC, IRON, RETICCTPCT in the last 72 hours. Urine analysis:    Component Value Date/Time   COLORURINE YELLOW 04/19/2020 [place] 04/13/2020 1145   LABSPEC 1.012 04/26/2020 1145   PHURINE 6.0 03/31/2020 1145   GLUCOSEU NEGATIVE 04/15/2020 1145   HGBUR NEGATIVE 04/21/2020 1145   BILIRUBINUR NEGATIVE 04/10/2020 1145   KETONESUR NEGATIVE 04/18/2020 1145   PROTEINUR NEGATIVE 04/21/2020 1145   UROBILINOGEN 0.2 01/06/2015 0927   NITRITE NEGATIVE 04/10/2020 1145   LEUKOCYTESUR NEGATIVE 04/04/2020 1145   Sepsis Labs: @LABRCNTIP (procalcitonin:4,lacticidven:4)  ) Recent Results (from the past 240 hour(s))  Blood Culture (routine x 2)     Status: None (Preliminary result)   Collection Time: 04/08/2020  9:46 AM   Specimen: BLOOD  Result Value Ref Range Status   Specimen Description   Final    BLOOD BLOOD LEFT  FOREARM Performed at [facility], [place] [address] [place], [place] [address]    Special Requests   Final    BOTTLES DRAWN AEROBIC AND ANAEROBIC Blood Culture adequate volume Performed at [place] [address] [place], [place] [address]    Culture   Final    NO GROWTH 3 DAYS Performed at [facility], [place] [address] [place], [place] [address]    Report Status PENDING  Incomplete  Resp Panel by RT-PCR (Flu A&B, Covid) Nasopharyngeal Swab     Status: None   Collection Time: 04/09/2020 11:30 AM   Specimen: Nasopharyngeal Swab; Nasopharyngeal(NP) swabs in vial transport medium  Result Value Ref Range Status   SARS Coronavirus 2 by RT PCR NEGATIVE NEGATIVE Final    Comment: (NOTE) SARS-CoV-2  target nucleic acids are NOT DETECTED.  The SARS-CoV-2 RNA is generally detectable in upper respiratory specimens during the acute phase of infection. The lowest concentration of SARS-CoV-2 viral copies this assay can detect is 138 copies/mL. A negative result does not preclude SARS-Cov-2 infection and should not be used as the sole basis for treatment or other patient management decisions. A negative result may occur with  improper specimen collection/handling, submission of specimen other than nasopharyngeal swab, presence of viral mutation(s) within the areas targeted by this assay, and inadequate number of viral copies(<138 copies/mL). A negative result must be combined with clinical observations, patient history, and epidemiological information. The expected result is Negative.  Fact Sheet for Patients:  EntrepreneurPulse.com.au  Fact Sheet for Healthcare Providers:  IncredibleEmployment.be  This test is no t yet approved or cleared by the [place] FDA and  has been authorized for detection and/or diagnosis of SARS-CoV-2 by FDA under an Emergency Use Authorization (EUA). This EUA will remain  in effect (meaning this test can be used) for the duration of the COVID-19 declaration under Section 564(b)(1) of the Act, 21 U.S.C.section 360bbb-3(b)(1), unless the authorization is terminated  or revoked sooner.       Influenza A by PCR NEGATIVE NEGATIVE Final   Influenza B by PCR NEGATIVE NEGATIVE Final    Comment: (NOTE) The Xpert Xpress SARS-CoV-2/FLU/RSV plus assay is intended as an aid in the diagnosis of influenza from Nasopharyngeal swab specimens and should not be used as a sole basis for treatment. Nasal washings and aspirates are unacceptable for Xpert Xpress SARS-CoV-2/FLU/RSV testing.  Fact Sheet for Patients: EntrepreneurPulse.com.au  Fact Sheet for Healthcare  Providers: IncredibleEmployment.be  This test is not yet approved or cleared by the [place] FDA and has been authorized for detection and/or diagnosis of SARS-CoV-2 by FDA under an Emergency Use Authorization (EUA). This EUA will remain in effect (meaning this test can be used) for the duration of the COVID-19 declaration under Section 564(b)(1) of the Act, 21 U.S.C. section 360bbb-3(b)(1), unless the authorization is terminated or revoked.  Performed at [facility], [place] [address] [place], [place] [address]   Urine culture     Status: None   Collection Time: 04/07/2020 11:45 AM   Specimen: In/Out Cath Urine  Result Value Ref Range Status   Specimen Description   Final    IN/OUT CATH URINE Performed at [place] [address] [place], [place] [address]    Special Requests   Final    NONE Performed at [facility], [place] [address], [place], [place] [address]    Culture   Final    NO GROWTH Performed  at [facility], [place] [address] [place], [place] [address]    Report Status 04/18/2020 FINAL  Final  MRSA PCR Screening     Status: None   Collection Time: 04/18/20  4:20 PM   Specimen: Nasopharyngeal  Result Value Ref Range Status   MRSA by PCR NEGATIVE NEGATIVE Final    Comment:        The GeneXpert MRSA Assay (FDA approved for NASAL specimens only), is one component of a comprehensive MRSA colonization surveillance program. It is not intended to diagnose MRSA infection nor to guide or monitor treatment for MRSA infections. Performed at [facility], [place] [address] [place], [place] [address]       Studies: No results found.  Scheduled Meds: . doxazosin  2 mg Oral Daily  . enoxaparin (LOVENOX) injection  30 mg Subcutaneous Q24H  . polyethylene glycol  17 g Oral Daily  . potassium & sodium phosphates  1 packet Oral TID WC & HS  . QUEtiapine  12.5 mg  Oral BID  . senna  1 tablet Oral Daily    Continuous Infusions: . sodium chloride 75 mL/hr at 04/20/20 0258  . cefTRIAXone (ROCEPHIN)  IV 1 g (04/20/20 1000)     LOS: 1 day     Kayleen Memos, MD [facility] Pager [phone]  If 7PM-7AM, please contact night-coverage [email or web address] Password [facility] 04/20/2020, 3:07 PM

## 2020-04-20 NOTE — Progress Notes (Signed)
SLP Cancellation Note  Patient Details Name: Howard Padilla MRN: [id number] DOB: 07-24-1915   Cancelled treatment:       Reason Eval/Treat Not Completed: Other (comment) (pt asleep upon SLP attempt for therapy session, also continued sleeping later in the day - will continue efforts.)  Kathleen Lime, MS [place] Office [phone] Pager [phone]    Macario Golds 04/20/2020, 3:57 PM

## 2020-04-20 NOTE — Progress Notes (Signed)
CT head negative for acute CVA, confusion likely 2/2 to delirium.  Delirium precautions in place.

## 2020-04-20 NOTE — Progress Notes (Signed)
Pt fidgey unable to sleep, MD on call NP [place] notified no new order. NP recommend to give prn vistaril if not effective to call back NP.

## 2020-04-20 NOTE — TOC Initial Note (Signed)
Transition of Care [facility]) - Initial/Assessment Note    Patient Details  Name: Howard Padilla MRN: [id number] Date of Birth: February 11, 1916  [facility] CM/SW Contact:    Trish Mage, LCSW Phone Number: 04/20/2020, 3:03 PM  Clinical Narrative:    Outpatient palliative referral made to [facility] in follow up MD consult for same.  Spoke with wife to confirm that she is planning on taking him home.  She was unprepared for that discussion as she is waiting results of "tests ordered by Dr." Donella Stade will continue to follow during the course of hospitalization.                Expected Discharge Plan: [place] Barriers to Discharge: No Barriers Identified   Patient Goals and CMS Choice        Expected Discharge Plan and Services Expected Discharge Plan: [place]                                              Prior Living Arrangements/Services                       Activities of Daily Living Home Assistive Devices/Equipment: Eyeglasses ADL Screening (condition at time of admission) Patient's cognitive ability adequate to safely complete daily activities?: No Is the patient deaf or have difficulty hearing?: No Does the patient have difficulty seeing, even when wearing glasses/contacts?: Yes Does the patient have difficulty concentrating, remembering, or making decisions?: Yes Patient able to express need for assistance with ADLs?: No Does the patient have difficulty dressing or bathing?: Yes Independently performs ADLs?: No Dressing (OT): Needs assistance Grooming: Needs assistance Feeding: Independent with device (comment) Bathing: Needs assistance Toileting: Needs assistance In/Out Bed: Needs assistance Walks in Home: Needs assistance Does the patient have difficulty walking or climbing stairs?: Yes Weakness of Legs: Both Weakness of Arms/Hands: None  Permission Sought/Granted                   Emotional Assessment              Admission diagnosis:  Acute metabolic encephalopathy [G64.40] Community acquired pneumonia, unspecified laterality [J18.9] Patient Active Problem List   Diagnosis Date Noted  . Acute metabolic encephalopathy 34/74/2595  . SBO (small bowel obstruction) ([place]) 03/29/2020  . Closed right hip fracture ([place]) 03/29/2020  . Near syncope 09/24/2017  . Acute kidney injury superimposed on chronic kidney disease ([place]) 09/24/2017  . Postural dizziness with presyncope 08/21/2017  . Bradycardia 07/14/2016  . Aphasia 01/06/2015  . Stroke ([place]) 01/06/2015  . Slurred speech   . TIA (transient ischemic attack)   . Urinary retention 12/25/2014  . Lung nodule 06/24/2013  . CAP (community acquired pneumonia) 06/23/2013  . 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 Lung abscess ([place]) 06/22/2013  . CKD (chronic kidney disease) stage 3, GFR 30-59 ml/min (HCC) 06/22/2013  . OTHER ABNORMAL BLOOD CHEMISTRY 02/03/2010  . PERSONAL HISTORY OF THROMBOPHLEBITIS 02/03/2010  . WEIGHT LOSS 10/29/2008  . DVT 06/24/2007  . LEG PAIN, LEFT 05/28/2007  . SYNCOPE 05/28/2007  . ACUTE BRONCHITIS 05/25/2007  . Anemia of renal disease 11/05/2006  . HLD (hyperlipidemia) 07/30/2006  . Essential hypertension 07/30/2006  . BPH (benign prostatic hyperplasia) 07/30/2006  . History of malignant neoplasm of large intestine 07/30/2006   PCP:  Isaac Bliss,  Rayford Halsted, MD Pharmacy:   [id number] - [place], [place] [address] Phone: [phone] Fax: [phone]  [facility] [phone] - [place], [place] - [address] [address] [place] [address] Phone: [phone] Fax: [phone]     Social Determinants of Health (SDOH) Interventions    Readmission Risk Interventions No flowsheet data found.

## 2020-04-21 DIAGNOSIS — J189 Pneumonia, unspecified organism: Secondary | ICD-10-CM | POA: Diagnosis not present

## 2020-04-21 LAB — COMPREHENSIVE METABOLIC PANEL
ALT: 26 U/L (ref 0–44)
AST: 40 U/L (ref 15–41)
Albumin: 2.6 g/dL — ABNORMAL LOW (ref 3.5–5.0)
Alkaline Phosphatase: 74 U/L (ref 38–126)
Anion gap: 11 (ref 5–15)
BUN: 39 mg/dL — ABNORMAL HIGH (ref 8–23)
CO2: 18 mmol/L — ABNORMAL LOW (ref 22–32)
Calcium: 8.6 mg/dL — ABNORMAL LOW (ref 8.9–10.3)
Chloride: 111 mmol/L (ref 98–111)
Creatinine, Ser: 2.93 mg/dL — ABNORMAL HIGH (ref 0.61–1.24)
GFR, Estimated: 18 mL/min — ABNORMAL LOW (ref >60)
Glucose, Bld: 124 mg/dL — ABNORMAL HIGH (ref 70–99)
Potassium: 4.5 mmol/L (ref 3.5–5.1)
Sodium: 140 mmol/L (ref 135–145)
Total Bilirubin: 0.7 mg/dL (ref 0.3–1.2)
Total Protein: 5.5 g/dL — ABNORMAL LOW (ref 6.5–8.1)

## 2020-04-21 LAB — PROCALCITONIN: Procalcitonin: 3.46 ng/mL

## 2020-04-21 LAB — CBC
HCT: 33 % — ABNORMAL LOW (ref 39.0–52.0)
Hemoglobin: 11 g/dL — ABNORMAL LOW (ref 13.0–17.0)
MCH: 31.8 pg (ref 26.0–34.0)
MCHC: 33.3 g/dL (ref 30.0–36.0)
MCV: 95.4 fL (ref 80.0–100.0)
Platelets: 170 10*3/uL (ref 150–400)
RBC: 3.46 MIL/uL — ABNORMAL LOW (ref 4.22–5.81)
RDW: 15 % (ref 11.5–15.5)
WBC: 4.4 10*3/uL (ref 4.0–10.5)
nRBC: 0 % (ref 0.0–0.2)

## 2020-04-21 LAB — MAGNESIUM: Magnesium: 2 mg/dL (ref 1.7–2.4)

## 2020-04-21 LAB — PHOSPHORUS: Phosphorus: 4.5 mg/dL (ref 2.5–4.6)

## 2020-04-21 MED ORDER — CHLORHEXIDINE GLUCONATE CLOTH 2 % EX PADS
6.0000 | MEDICATED_PAD | Freq: Every day | CUTANEOUS | Status: DC
  Administered 2020-04-22 – 2020-04-23 (×2): 6 via TOPICAL

## 2020-04-21 MED ORDER — LABETALOL HCL 5 MG/ML IV SOLN: 5.0000 mg | Freq: Once | INTRAVENOUS | Status: DC

## 2020-04-21 MED ORDER — HEPARIN SODIUM (PORCINE) 5000 UNIT/ML IJ SOLN
5000.0000 [IU] | Freq: Three times a day (TID) | INTRAMUSCULAR | Status: DC
  Administered 2020-04-21 – 2020-04-23 (×5): 5000 [IU] via SUBCUTANEOUS
  Filled 2020-04-21 (×5): qty 1

## 2020-04-21 MED ORDER — SODIUM CHLORIDE 0.9 % IV SOLN
INTRAVENOUS | Status: DC | PRN
  Administered 2020-04-21: 500 mL via INTRAVENOUS

## 2020-04-21 MED ORDER — HYDRALAZINE HCL 20 MG/ML IJ SOLN
10.0000 mg | Freq: Three times a day (TID) | INTRAMUSCULAR | Status: AC
Start: 2020-04-21 — End: 2020-04-22
  Administered 2020-04-21 – 2020-04-22 (×2): 10 mg via INTRAVENOUS
  Filled 2020-04-21 (×2): qty 1

## 2020-04-21 MED ORDER — HYDRALAZINE HCL 25 MG PO TABS: 25.0000 mg | ORAL_TABLET | Freq: Three times a day (TID) | ORAL | Status: DC

## 2020-04-21 MED ORDER — CEFDINIR 300 MG PO CAPS
300.0000 mg | ORAL_CAPSULE | Freq: Every day | ORAL | Status: DC
  Filled 2020-04-21 (×2): qty 1

## 2020-04-21 NOTE — Progress Notes (Signed)
  Speech Language Pathology Treatment: Dysphagia  Patient Details Name: Howard Padilla MRN: [id number] DOB: 07/08/1915 Today's Date: 04/21/2020 Time: 0240-9735 SLP Time Calculation (min) (ACUTE ONLY): 45 min  Assessment / Plan / Recommendation Clinical Impression  At this time, care plan includes comfort eating per MD notes.  Pt benefited from warm water via toothette to loosen secretions and suction toothette to clear his mouth of dried green tinged secretions on soft palate - of which he did not have awareness.  Pt winced with oral care but did not verbalize if he was having discomfort.  Following oral care, he was not adequately alert to consume any po intake.    Therefore SLP educated pt's family to dysphagia causes/sources in sequalae of dementia, advanced age, illness, etc.  Discussed providing pt with po intake only when he was fully alert and accepting for comfort.  Demonstrated swallow to allow pt's family to observed laryngeal elevation to assure pt swallows.  Gustatory changes, administration of po modifications discussed including using straw to cup to spooned amounts to maximize comfort - not for nutritional support.    Demonstrated use of oral suction to family to use as needed - advised only to use at front of oral cavity - as to not elicit gag that may cause vomiting.  Pt's wife advised prior to admission he would eat "a lot".  Given current illness, meeting him "where he is" reviewed. Family verbalized understanding and SLP provided them information in 2 handouts for future use.  RN advised difficulty getting pt to swallow his medications. Recommend prioritize medications and provide medications available via IV via IV.    Approximately 30 minutes the session, the pt began coughing and expectorated viscous yellow secretions on his chest, approximately a Tbsp amount.  Immediately sat pt up when coughing occured and provided oral suctioning.    Comfort feeds established per MD, given  pt's edentulous and mentation is altered, suspect puree/thin will be most approrpriate for him if he is able to consume anything.  Thanks for allowing me to help provide care for this pt.    HPI HPI: Patient is an 87 y.o. male with PMH: HLD, HTN, BPH, dementia, TIA's vs CVA's, DVT who presented to ED from home secondary to generalized weakness and confusion. Wife reported at time of admission that patient had c/o becoming quickly full during previous night's dinner. CT head did not reveal any acute intracranial abnormalities; CXR revealed patchy airspace opacity in right mid and lower lung, potentially progressive scarring versus lobar PNA.  Pt had undergone BSE two days prior but has been lethargic when SLP attempted to see for follow up.  MD spoke to family re: comfort eating.      SLP Plan  All goals met       Recommendations  Diet recommendations: Dysphagia 1 (puree);Thin liquid Medication Administration: Via alternative means (IV as able) Compensations: Minimize environmental distractions;Slow rate;Small sips/bites;Other (Comment) (assure pt fully alert and accepting for any intake)                Oral Care Recommendations: Oral care BID Follow up Recommendations: None SLP Visit Diagnosis: Dysphagia, oropharyngeal phase (R13.12) Plan: All goals met       GO               Kathleen Lime, MS [facility] SLP Acute Rehab Services Office [phone] Pager [phone]   Macario Golds 04/21/2020, 5:36 PM

## 2020-04-21 NOTE — Progress Notes (Signed)
On-call notified notified of BP 162/86. Rechecked with on-call attending on phone, 152/97. New orders are being put into system. Will continue to monitor the patient.

## 2020-04-21 NOTE — Progress Notes (Addendum)
PROGRESS NOTE   Howard Padilla  XWR:[id number] DOB: [birth year] DOA: 04/03/2020 PCP: Isaac Bliss, Rayford Halsted, MD  Brief Narrative:  87 year old home dwelling black male mild confusion at times but physically functional Known history of HTN TIAs in the past CKD 3 A Probable prostate malignancy recently diagnosed Recently admitted 1/30 through 03/30/2020 by this examiner-at that time found to have SBO which resolved severe constipation-general surgery cleared the patient for diet as this was felt to be more stool impaction   Found to have sepsis on admission on 04/06/2020 from aspiration pneumonia   Assessment & Plan: Sepsis from aspiration pneumonia Transition off ceftriaxone day 5-->Augmentin stop date 2/25 as procalcitonin downward trending Increase saline rate to 100 cc/h Dysphagia with aspiration will re- discuss with family regarding implications of continued feeding--but will provide comfort feeds Currently continue dysphagia diet with supervised feeds Worsening ATN likely secondary to AKI query cause  bladder scan, R/O obstructive uropathy. Toxic metabolic encephalopathy CVA ruled out by imaging-likely secondary to azotemia, meds Discontinue Seroquel and reassess mental status over the next several days Essential hypertension, poorly controlled Continue amlodipine 5, added hydralazine 25 3 times daily 2/23 Reassess over next several days Prostate malignancy versus BPH Last admission had markedly enlarged prostate but decision was foregone in terms of work-up at the time Await bladder scan Ordered PSA to give an idea as to whether this is a surrogate for cancer Further discussion depending on clinical course Prior multiple strokes CT scan head 2/22 - for acute CVA Hold on MRI until discussion with family Chronic severe constipation resulting in obstipation Discharge last admission on scheduled MiraLAX sennaWhich has been continued  DVT prophylaxis: Heparin Code Status:  DNR Family Communication: Perkins Molina [phone] but did not receive a response Called daughter Alberteen Spindle [phone] Disposition:  Status is: Inpatient  Remains inpatient appropriate because:Hemodynamically unstable, Persistent severe electrolyte disturbances, Altered mental status and Ongoing diagnostic testing needed not appropriate for outpatient work up   Dispo:  Patient From: Home  Planned Disposition: Home with Health Care Svc  Expected discharge date: 04/22/2020  Medically stable for discharge: No     Consultants:   None currently  Procedures: None  Antimicrobials:  Ceftriaxone since 2/19-->2/23 Augmentin 2/23 expected end date 2/25   Subjective: Patient is sleepy and incoherent He is in mittens and it is difficult to get any history out of him When I seen him previously this month he was AAO X4 although hard of hearing  Objective: Vitals:   04/20/20 1944 04/21/20 0330 04/21/20 0527 04/21/20 0633  BP: (!) 161/89 (!) 161/87 (!) 162/86 (!) 152/97  Pulse: 91 91    Resp: 18 15    Temp: 97.7 F (36.5 C) 97.8 F (36.6 C)    TempSrc: Oral Oral    SpO2: (!) 89% 93%    Weight:    56 kg  Height:        Intake/Output Summary (Last 24 hours) at 04/21/2020 1122 Last data filed at 04/21/2020 0400 Gross per 24 hour  Intake 399.29 ml  Output --  Net 399.29 ml   Filed Weights   04/21/2020 1007 04/21/20 6962  Weight: 53.7 kg 56 kg    Examination: Cachectic black male no cardiorespiratory distress Bitemporal wasting supraclavicular wasting Abdomen scaphoid no rebound no guarding Condom catheter in place No lower extremity edema Did not examine sacrum today Neurologically intact and reacts to [facility] however cannot properly assess power or sensory given patient's confusion     Data Reviewed:  personally reviewed   CBC    Component Value Date/Time   WBC 4.4 04/21/2020 0403   RBC 3.46 (L) 04/21/2020 0403   HGB 11.0 (L) 04/21/2020 0403   HCT 33.0  (L) 04/21/2020 0403   PLT 170 04/21/2020 0403   MCV 95.4 04/21/2020 0403   MCH 31.8 04/21/2020 0403   MCHC 33.3 04/21/2020 0403   RDW 15.0 04/21/2020 0403   LYMPHSABS 0.1 (L) 04/09/2020 0959   MONOABS 0.2 04/26/2020 0959   EOSABS 0.0 04/07/2020 0959   BASOSABS 0.0 04/20/2020 0959   CMP Latest Ref Rng & Units 04/21/2020 04/20/2020 04/19/2020  Glucose 70 - 99 mg/dL 124(H) 136(H) 105(H)  BUN 8 - 23 mg/dL 39(H) 34(H) 33(H)  Creatinine 0.61 - 1.24 mg/dL 2.93(H) 1.81(H) 1.46(H)  Sodium 135 - 145 mmol/L 140 139 138  Potassium 3.5 - 5.1 mmol/L 4.5 4.2 4.2  Chloride 98 - 111 mmol/L 111 109 108  CO2 22 - 32 mmol/L 18(L) 20(L) 21(L)  Calcium 8.9 - 10.3 mg/dL 8.6(L) 8.6(L) 8.6(L)  Total Protein 6.5 - 8.1 g/dL 5.5(L) 5.9(L) 5.9(L)  Total Bilirubin 0.3 - 1.2 mg/dL 0.7 0.8 1.0  Alkaline Phos 38 - 126 U/L 74 79 80  AST 15 - 41 U/L 40 57(H) 60(H)  ALT 0 - 44 U/L 26 28 23      Radiology Studies: CT HEAD WO CONTRAST  Result Date: 04/20/2020 CLINICAL DATA:  87 year old male with altered mental status. EXAM: CT HEAD WITHOUT CONTRAST TECHNIQUE: Contiguous axial images were obtained from the base of the skull through the vertex without intravenous contrast. COMPARISON:  Head CT dated 03/30/2020. FINDINGS: Brain: Moderate age-related atrophy and chronic microvascular ischemic changes. Left parietal and occipital old infarcts. There is no acute intracranial hemorrhage. No mass effect or midline shift. No extra-axial fluid collection. Vascular: No hyperdense vessel or unexpected calcification. Skull: Normal. Negative for fracture or focal lesion. Sinuses/Orbits: Mild mucoperiosteal thickening of paranasal sinuses. The right mastoid air cells are clear. There is complete opacification of left mastoid air cells. Other: None IMPRESSION: 1. No acute intracranial pathology. 2. Moderate age-related atrophy and chronic microvascular ischemic changes. Old left parietal and occipital infarcts. 3. Left mastoid effusion.  Electronically Signed   By: Anner Crete M.D.   On: 04/20/2020 18:50     Scheduled Meds: . amLODipine  5 mg Oral Daily  . doxazosin  2 mg Oral Daily  . heparin injection (subcutaneous)  5,000 Units Subcutaneous Q8H  . polyethylene glycol  17 g Oral Daily  . QUEtiapine  12.5 mg Oral BID  . senna  1 tablet Oral Daily   Continuous Infusions: . sodium chloride 100 mL/hr at 04/21/20 1057  . cefTRIAXone (ROCEPHIN)  IV 1 g (04/21/20 0910)     LOS: 2 days   Time spent: [place], MD [facility] To contact the attending provider between 7A-7P or the covering provider during after hours 7P-7A, please log into the web site [email or web address] and access using universal [place] password for that web site. If you do not have the password, please call the hospital operator.  04/21/2020, 11:22 AM

## 2020-04-21 NOTE — Progress Notes (Signed)
On-call attending called and said to hold on labetalol due to patients age and will be getting scheduled norvasc. Continue to monitor BP and let MD know if SBP is above 160.

## 2020-04-22 DIAGNOSIS — J189 Pneumonia, unspecified organism: Secondary | ICD-10-CM | POA: Diagnosis not present

## 2020-04-22 LAB — CBC WITH DIFFERENTIAL/PLATELET
Abs Immature Granulocytes: 0.02 10*3/uL (ref 0.00–0.07)
Basophils Absolute: 0 10*3/uL (ref 0.0–0.1)
Basophils Relative: 0 %
Eosinophils Absolute: 0 10*3/uL (ref 0.0–0.5)
Eosinophils Relative: 0 %
HCT: 33.9 % — ABNORMAL LOW (ref 39.0–52.0)
Hemoglobin: 11.3 g/dL — ABNORMAL LOW (ref 13.0–17.0)
Immature Granulocytes: 1 %
Lymphocytes Relative: 12 %
Lymphs Abs: 0.4 10*3/uL — ABNORMAL LOW (ref 0.7–4.0)
MCH: 31.8 pg (ref 26.0–34.0)
MCHC: 33.3 g/dL (ref 30.0–36.0)
MCV: 95.5 fL (ref 80.0–100.0)
Monocytes Absolute: 0.3 10*3/uL (ref 0.1–1.0)
Monocytes Relative: 8 %
Neutro Abs: 2.8 10*3/uL (ref 1.7–7.7)
Neutrophils Relative %: 79 %
Platelets: 152 10*3/uL (ref 150–400)
RBC: 3.55 MIL/uL — ABNORMAL LOW (ref 4.22–5.81)
RDW: 15.3 % (ref 11.5–15.5)
WBC: 3.5 10*3/uL — ABNORMAL LOW (ref 4.0–10.5)
nRBC: 0 % (ref 0.0–0.2)

## 2020-04-22 LAB — PSA (REFLEX TO FREE) (SERIAL): Prostate Specific Ag, Serum: 309 ng/mL — ABNORMAL HIGH (ref 0.0–4.0)

## 2020-04-22 LAB — CULTURE, BLOOD (ROUTINE X 2)
Culture: NO GROWTH
Special Requests: ADEQUATE

## 2020-04-22 LAB — COMPREHENSIVE METABOLIC PANEL
ALT: 29 U/L (ref 0–44)
AST: 34 U/L (ref 15–41)
Albumin: 2.7 g/dL — ABNORMAL LOW (ref 3.5–5.0)
Alkaline Phosphatase: 78 U/L (ref 38–126)
Anion gap: 12 (ref 5–15)
BUN: 41 mg/dL — ABNORMAL HIGH (ref 8–23)
CO2: 17 mmol/L — ABNORMAL LOW (ref 22–32)
Calcium: 8.7 mg/dL — ABNORMAL LOW (ref 8.9–10.3)
Chloride: 112 mmol/L — ABNORMAL HIGH (ref 98–111)
Creatinine, Ser: 2.95 mg/dL — ABNORMAL HIGH (ref 0.61–1.24)
GFR, Estimated: 18 mL/min — ABNORMAL LOW (ref >60)
Glucose, Bld: 79 mg/dL (ref 70–99)
Potassium: 4.3 mmol/L (ref 3.5–5.1)
Sodium: 141 mmol/L (ref 135–145)
Total Bilirubin: 0.9 mg/dL (ref 0.3–1.2)
Total Protein: 5.9 g/dL — ABNORMAL LOW (ref 6.5–8.1)

## 2020-04-22 LAB — PROCALCITONIN: Procalcitonin: 1.64 ng/mL

## 2020-04-22 MED ORDER — HYDRALAZINE HCL 20 MG/ML IJ SOLN
10.0000 mg | Freq: Three times a day (TID) | INTRAMUSCULAR | Status: AC
  Administered 2020-04-22 – 2020-04-23 (×2): 10 mg via INTRAVENOUS
  Filled 2020-04-22 (×2): qty 1

## 2020-04-22 NOTE — Care Management Important Message (Signed)
Important Message  Patient Details IM Letter mailed to [facility]. Name: Sarkis Rhines MRN: [id number] Date of Birth: 1915-09-07   Medicare Important Message Given:  Yes     Kerin Salen 04/22/2020, 10:30 AM

## 2020-04-22 NOTE — Progress Notes (Signed)
PROGRESS NOTE   Howard Padilla  [id number]:[id number] DOB: [birth year] DOA: 04/21/2020 PCP: Isaac Bliss, Rayford Halsted, MD  Brief Narrative:  87 year old home dwelling black male mild confusion at times but physically functional Known history of HTN TIAs in the past CKD 3 A Probable prostate malignancy recently diagnosed Recently admitted 1/30 through 03/30/2020 by this examiner-at that time found to have SBO which resolved severe constipation-general surgery cleared the patient for diet as this was felt to be more stool impaction   Found to have sepsis on admission on 04/10/2020 from aspiration pneumonia Palliative care consulted given multiple deranged parameters Family interested in possible hospice as below   Assessment & Plan:  Toxic metabolic encephalopathy CVA ruled out by imaging-likely secondary to azotemia, meds Seroquel discontinued 2/23 Mild improvement in cognition saying "no" in addition to some purposeful movements See below discussion Sepsis from aspiration pneumonia Transition off ceftriaxone day 5-->Augmentin stop date 2/25  procalcitonin downward trending continue NS 100 cc/h, net fluid balance is +3.4 L Dysphagia with aspiration Continue dysphagia 1 diet thin liquids when awake alert per SLP Long discussion 2/24 with multiple family members about silent aspiration and risks chronic aspiration see below Worsening ATN likely secondary to AKI query cause  BOO bladder scan showed retention of urine Catheter in place 2/23 which we will continue Await possible recovery Essential hypertension, poorly controlled Continue amlodipine 5, added hydralazine 25 3 times daily 2/23 Reassess over next several days Prostate malignancy versus BPH Last admission had markedly enlarged prostate Ordered PSA  Further discussion depending on clinical course Prior multiple strokes CT scan head 2/22 - for acute CVA All likely secondary to metabolic encephalopathy-do not MRI Chronic  severe constipation resulting in obstipation Discharge last admission on scheduled MiraLAX sennaWhich has been continued  DVT prophylaxis: Heparin Code Status: DNR Family Communication:  Discussed in detail with Salvadore Dom the granddaughter and about the dying daughter on 2/24 in person and explained that although mild improvements have been made to Mr. Tornow clinical condition, his outcome is likely quite guarded given repeat hospitalizations within a month and various indicators pointing to adult failure to thrive I have explained to them we can monitor for another day but probably discharge home with hospice may offer him comfort which is what they wish for him as well Disposition:  Status is: Inpatient  Remains inpatient appropriate because:Hemodynamically unstable, Persistent severe electrolyte disturbances, Altered mental status and Ongoing diagnostic testing needed not appropriate for outpatient work up   Dispo:  Patient From: Home  Planned Disposition: To be determined  Expected discharge date: 04/22/2020  Medically stable for discharge: No     Consultants:   None currently  Procedures: None  Antimicrobials:  Ceftriaxone since 2/19-->2/23 Augmentin 2/23 expected end date 2/25   Subjective: Slightly more coherent per nursing Sounds like he might have aspirated earlier in the day according to nursing partial lateral chest  Objective: Vitals:   04/21/20 1700 04/21/20 2211 04/22/20 0546 04/22/20 0651  BP: (!) 161/89 (!) 146/80 (!) 141/81   Pulse: 86 96 (!) 109   Resp:  20 20   Temp: 97.7 F (36.5 C) 97.8 F (36.6 C) 99 F (37.2 C)   TempSrc: Axillary     SpO2: 96% 97% 95%   Weight:    52.5 kg  Height:        Intake/Output Summary (Last 24 hours) at 04/22/2020 1444 Last data filed at 04/22/2020 0900 Gross per 24 hour  Intake 80.69 ml  Output 2250  ml  Net -2169.31 ml   Filed Weights   04/16/2020 1007 04/21/20 0633 04/22/20 0651  Weight: 53.7 kg  56 kg 52.5 kg    Examination:  Slightly more responsive withdraws to my interaction with him opens eyes some but nonverbal S1-S2 no murmur Abdomen soft no rebound no guarding Chest clear Temporal wasting  Data Reviewed: personally reviewed   CBC    Component Value Date/Time   WBC 3.5 (L) 04/22/2020 0400   RBC 3.55 (L) 04/22/2020 0400   HGB 11.3 (L) 04/22/2020 0400   HCT 33.9 (L) 04/22/2020 0400   PLT 152 04/22/2020 0400   MCV 95.5 04/22/2020 0400   MCH 31.8 04/22/2020 0400   MCHC 33.3 04/22/2020 0400   RDW 15.3 04/22/2020 0400   LYMPHSABS 0.4 (L) 04/22/2020 0400   MONOABS 0.3 04/22/2020 0400   EOSABS 0.0 04/22/2020 0400   BASOSABS 0.0 04/22/2020 0400   CMP Latest Ref Rng & Units 04/22/2020 04/21/2020 04/20/2020  Glucose 70 - 99 mg/dL 79 124(H) 136(H)  BUN 8 - 23 mg/dL 41(H) 39(H) 34(H)  Creatinine 0.61 - 1.24 mg/dL 2.95(H) 2.93(H) 1.81(H)  Sodium 135 - 145 mmol/L 141 140 139  Potassium 3.5 - 5.1 mmol/L 4.3 4.5 4.2  Chloride 98 - 111 mmol/L 112(H) 111 109  CO2 22 - 32 mmol/L 17(L) 18(L) 20(L)  Calcium 8.9 - 10.3 mg/dL 8.7(L) 8.6(L) 8.6(L)  Total Protein 6.5 - 8.1 g/dL 5.9(L) 5.5(L) 5.9(L)  Total Bilirubin 0.3 - 1.2 mg/dL 0.9 0.7 0.8  Alkaline Phos 38 - 126 U/L 78 74 79  AST 15 - 41 U/L 34 40 57(H)  ALT 0 - 44 U/L 29 26 28      Radiology Studies: CT HEAD WO CONTRAST  Result Date: 04/20/2020 CLINICAL DATA:  87 year old male with altered mental status. EXAM: CT HEAD WITHOUT CONTRAST TECHNIQUE: Contiguous axial images were obtained from the base of the skull through the vertex without intravenous contrast. COMPARISON:  Head CT dated 03/31/2020. FINDINGS: Brain: Moderate age-related atrophy and chronic microvascular ischemic changes. Left parietal and occipital old infarcts. There is no acute intracranial hemorrhage. No mass effect or midline shift. No extra-axial fluid collection. Vascular: No hyperdense vessel or unexpected calcification. Skull: Normal. Negative for  fracture or focal lesion. Sinuses/Orbits: Mild mucoperiosteal thickening of paranasal sinuses. The right mastoid air cells are clear. There is complete opacification of left mastoid air cells. Other: None IMPRESSION: 1. No acute intracranial pathology. 2. Moderate age-related atrophy and chronic microvascular ischemic changes. Old left parietal and occipital infarcts. 3. Left mastoid effusion. Electronically Signed   By: Anner Crete M.D.   On: 04/20/2020 18:50     Scheduled Meds: . amLODipine  5 mg Oral Daily  . cefdinir  300 mg Oral Daily  . Chlorhexidine Gluconate Cloth  6 each Topical Daily  . doxazosin  2 mg Oral Daily  . heparin injection (subcutaneous)  5,000 Units Subcutaneous Q8H  . hydrALAZINE  25 mg Oral Q8H  . polyethylene glycol  17 g Oral Daily  . senna  1 tablet Oral Daily   Continuous Infusions: . sodium chloride 500 mL (04/21/20 2121)     LOS: 3 days   Time spent: [place], MD [facility] To contact the attending provider between 7A-7P or the covering provider during after hours 7P-7A, please log into the web site [email or web address] and access using universal [place] password for that web site. If you do not have the password, please call the hospital operator.  04/22/2020,  2:44 PM

## 2020-04-22 NOTE — Progress Notes (Signed)
PT Cancellation Note  Patient Details Name: Howard Padilla MRN: [id number] DOB: Jun 28, 1915   Cancelled Treatment:    Reason Eval/Treat Not Completed: Fatigue/lethargy limiting ability to participate.   Claretha Cooper 04/22/2020, 11:31 AM [place] Pager [phone] Office [phone]

## 2020-04-22 NOTE — Progress Notes (Signed)
Patient is not alert and able to safely swallow PO meds at this time. On-call X.Blount notified via AMION for order to change PO hydralazine to IV. Will continue to monitor the patient.

## 2020-04-22 NOTE — Progress Notes (Signed)
Patients wife [name] called for update on patient. [name] is requesting that the granddaughter Edrick Oh and herself are the family members that can call for updates and visit the patient.

## 2020-04-23 DIAGNOSIS — J189 Pneumonia, unspecified organism: Secondary | ICD-10-CM | POA: Diagnosis not present

## 2020-04-23 DIAGNOSIS — Z515 Encounter for palliative care: Secondary | ICD-10-CM | POA: Diagnosis not present

## 2020-04-23 LAB — CBC WITH DIFFERENTIAL/PLATELET
Abs Immature Granulocytes: 0.01 10*3/uL (ref 0.00–0.07)
Basophils Absolute: 0 10*3/uL (ref 0.0–0.1)
Basophils Relative: 1 %
Eosinophils Absolute: 0 10*3/uL (ref 0.0–0.5)
Eosinophils Relative: 0 %
HCT: 34.9 % — ABNORMAL LOW (ref 39.0–52.0)
Hemoglobin: 11.6 g/dL — ABNORMAL LOW (ref 13.0–17.0)
Immature Granulocytes: 0 %
Lymphocytes Relative: 10 %
Lymphs Abs: 0.3 10*3/uL — ABNORMAL LOW (ref 0.7–4.0)
MCH: 31.7 pg (ref 26.0–34.0)
MCHC: 33.2 g/dL (ref 30.0–36.0)
MCV: 95.4 fL (ref 80.0–100.0)
Monocytes Absolute: 0.2 10*3/uL (ref 0.1–1.0)
Monocytes Relative: 9 %
Neutro Abs: 2.2 10*3/uL (ref 1.7–7.7)
Neutrophils Relative %: 80 %
Platelets: 149 10*3/uL — ABNORMAL LOW (ref 150–400)
RBC: 3.66 MIL/uL — ABNORMAL LOW (ref 4.22–5.81)
RDW: 15.2 % (ref 11.5–15.5)
WBC: 2.8 10*3/uL — ABNORMAL LOW (ref 4.0–10.5)
nRBC: 0 % (ref 0.0–0.2)

## 2020-04-23 LAB — COMPREHENSIVE METABOLIC PANEL
ALT: 24 U/L (ref 0–44)
AST: 21 U/L (ref 15–41)
Albumin: 2.5 g/dL — ABNORMAL LOW (ref 3.5–5.0)
Alkaline Phosphatase: 81 U/L (ref 38–126)
Anion gap: 15 (ref 5–15)
BUN: 48 mg/dL — ABNORMAL HIGH (ref 8–23)
CO2: 16 mmol/L — ABNORMAL LOW (ref 22–32)
Calcium: 8.6 mg/dL — ABNORMAL LOW (ref 8.9–10.3)
Chloride: 113 mmol/L — ABNORMAL HIGH (ref 98–111)
Creatinine, Ser: 2.14 mg/dL — ABNORMAL HIGH (ref 0.61–1.24)
GFR, Estimated: 27 mL/min — ABNORMAL LOW (ref >60)
Glucose, Bld: 103 mg/dL — ABNORMAL HIGH (ref 70–99)
Potassium: 3.8 mmol/L (ref 3.5–5.1)
Sodium: 144 mmol/L (ref 135–145)
Total Bilirubin: 1.1 mg/dL (ref 0.3–1.2)
Total Protein: 5.7 g/dL — ABNORMAL LOW (ref 6.5–8.1)

## 2020-04-27 NOTE — Death Summary Note (Addendum)
Death Summary  Howard Padilla KGU:[id number] DOB: 26-Oct-1915 DOA: 04-20-2020  PCP: Isaac Bliss, Rayford Halsted, MD  Admit date: April 20, 2020 Date of Death: 04/26/2020 Time of Death: 81 Notification: Isaac Bliss, Rayford Halsted, MD notified of death of 04/26/2020   History of present illness:  87 year old home dwelling black male mild confusion at times but physically functional Known history of HTN TIAs in the past CKD 3 A  prostate malignancy recently diagnosed Recently admitted 1/30 through 03/30/2020 by this examiner-at that time found to have SBO which resolved [felt more to be secondary to severe constipation]-general surgery cleared the patient for diet as this was felt to be more stool impaction  Found to have sepsis on admission on 04/20/2020 from aspiration pneumonia Palliative care consulted given multiple deranged parameters  We had multiple discussions about end-of-life  throughout the hospital course with the patient's family and I discussed this with multiple family members including the granddaughters Patient initially seemed to improve slightly on 2/24 after withdrawal of some of his neuroleptics but his kidney function remained stable and did not improve He was not anywhere near as responsive as he had been previously  We were in the process consolidate goals of care with palliative care when he was found to be apneic and was pronounced at 12:10 PM 04/26/20  Condolences were offered to family   Final Diagnoses:  Toxic metabolic encephalopathy secondary to AKI/dysphagia with aspiration pneumonia and severe sepsis on admit Underlying prostate cancer confirmed by PSA this admission Multiple strokes ATN from sepsis    The results of significant diagnostics from this hospitalization (including imaging, microbiology, ancillary and laboratory) are listed below for reference.    Significant Diagnostic Studies: CT HEAD WO CONTRAST  Result Date: 04/20/2020 CLINICAL DATA:   87 year old male with altered mental status. EXAM: CT HEAD WITHOUT CONTRAST TECHNIQUE: Contiguous axial images were obtained from the base of the skull through the vertex without intravenous contrast. COMPARISON:  Head CT dated April 20, 2020. FINDINGS: Brain: Moderate age-related atrophy and chronic microvascular ischemic changes. Left parietal and occipital old infarcts. There is no acute intracranial hemorrhage. No mass effect or midline shift. No extra-axial fluid collection. Vascular: No hyperdense vessel or unexpected calcification. Skull: Normal. Negative for fracture or focal lesion. Sinuses/Orbits: Mild mucoperiosteal thickening of paranasal sinuses. The right mastoid air cells are clear. There is complete opacification of left mastoid air cells. Other: None IMPRESSION: 1. No acute intracranial pathology. 2. Moderate age-related atrophy and chronic microvascular ischemic changes. Old left parietal and occipital infarcts. 3. Left mastoid effusion. Electronically Signed   By: Anner Crete M.D.   On: 04/20/2020 18:50   CT HEAD WO CONTRAST  Result Date: 2020-04-20 CLINICAL DATA:  Altered mental status. EXAM: CT HEAD WITHOUT CONTRAST TECHNIQUE: Contiguous axial images were obtained from the base of the skull through the vertex without intravenous contrast. COMPARISON:  August 21, 2017 FINDINGS: Brain: The study is mildly limited due to patient motion. The patient was image lying on his right side with the technologist holding the patient. The images obtained with the best possible. No subdural, epidural, or subarachnoid hemorrhage identified. No mass effect or midline shift. The ventricles and sulci are prominent but stable. The cerebellum, brainstem, and basal cisterns are unchanged unremarkable. Moderate white matter changes are identified. A remote left frontal parietal infarct is unchanged. No acute cortical ischemia or infarct identified. No mass effect or midline shift. Vascular: Calcified  atherosclerosis is seen in the intracranial carotids. Skull: Normal. Negative for fracture or focal lesion.  Sinuses/Orbits: The left mastoid air cells are almost completely opacified, unchanged. A portion of the left middle ear is opacified, unchanged. Opacification of scattered right mastoid air cells is also stable. The right middle ear is well aerated. No paranasal sinus abnormalities identified. Other: None. IMPRESSION: 1. Chronic intracranial abnormalities identified as above including chronic white matter changes and a chronic left-sided infarct. No acute intracranial abnormalities are noted. 2. No acute abnormalities in the skull, sinuses, orbits. Electronically Signed   By: Dorise Bullion III M.D   On: 04/20/2020 12:14   CT Abdomen Pelvis W Contrast  Result Date: 03/29/2020 CLINICAL DATA:  Abdominal pain, distention, emesis EXAM: CT ABDOMEN AND PELVIS WITH CONTRAST TECHNIQUE: Multidetector CT imaging of the abdomen and pelvis was performed using the standard protocol following bolus administration of intravenous contrast. CONTRAST:  70mL OMNIPAQUE IOHEXOL 300 MG/ML  SOLN COMPARISON:  Radiograph 03/28/2020 FINDINGS: Lower chest: Some coarsened reticular changes in ground-glass opacity noted in the lung bases. Mild pulmonary vascular congestion is noted as well. Borderline cardiomegaly. Three-vessel coronary artery atherosclerosis. No pericardial effusion. Hepatobiliary: No visible focal liver lesion. Smooth liver surface contour. Gallbladder is not well visualized. Correlate for prior cholecystectomy versus decompression. Intra and extrahepatic biliary ductal dilatation may be related to patient's advanced age/senescent changes. Pancreas: Mild pancreatic atrophy. No pancreatic ductal dilatation or surrounding inflammatory changes. Spleen: Normal in size. No concerning splenic lesions. Adrenals/Urinary Tract: Normal adrenal glands. Bilateral cortical thinning. Asymmetric severe left hydroureteronephrosis  to the level of a heterogeneous mass which appears to arise from the prostate gland likely resulting in some mass effect or direct involvement of the ureter proper. No right urinary tract dilatation. No visible obstructing urolithiasis. Fluid attenuation cyst are seen bilaterally. No concerning renal mass. Stomach/Bowel: Evaluation of the bowel and mesentery limited by a marked paucity of intraperitoneal fat and lack of enteric contrast media. Distal esophagus and stomach are unremarkable. Diffuse air and fluid-filled appearance of the small bowel with a site of possible transition in the right lower quadrant (5/79, 2/56). Colon is free of significant thickening or dilatation. Rectal stool ball measuring up to 8.5 cm in diameter, correlate for symptoms of impaction. Postsurgical changes noted about the rectosigmoid, compatible with a history of prior colectomy. Feculent material between the gluteal cleft. Vascular/Lymphatic: Extensive severe atherosclerotic plaque throughout the abdominal aorta and branch vessels. Irregular multifocal fusiform aneurysmal dilatation throughout the abdominal aorta measuring up to 3 cm in maximal diameter. Calcified noncalcified plaque results in some fairly proximal occlusion of the right internal iliac artery and branches of the left internal iliac artery, incompletely characterized on this non angiographic technique. Evaluation of the lymph nodes is limited in a paucity of intraperitoneal fat. Several enhancing nodes may be present the deep pelvis in the region anterior to the bladder. Reproductive: Marked enlargement and heterogeneity of the prostate gland and seminal vesicles. Suspect this results in some direct mass effect or involvement of the left ureter given the upstream obstruction. Appearance is highly worrisome for prostate malignancy. Correlate with PSA and tissue sampling as appropriate. High-riding appearance of the right testis. Other: Paucity of subcutaneous and  intraperitoneal fat. Diffuse body wall edema. Soft tissue thickening and stranding superficial to the sacrum, correlate with visual inspection to exclude decubitus ulceration. No discernible abdominopelvic free air or fluid. No organized abscess or collection is seen. Musculoskeletal: Diffusely mottled appearance of the osseous structures with marked bony demineralization. Multilevel discogenic and facet degenerative changes and Schmorl's node formations are present. Impacted deformity of the  right subcapital femoral neck. Possibly acute with fracture line difficult to discern in the setting of demineralization. IMPRESSION: 1. Evidence of small-bowel obstruction with possible transition point in the right lower quadrant, etiology unclear. 2. Marked enlargement and heterogeneity of the prostate gland and seminal vesicles. Appearance is highly worrisome for prostate malignancy. Correlate with PSA and tissue sampling as appropriate. Several enhancing nodes may be present the deep pelvis in the region anterior to the bladder, cannot exclude metastatic involvement. Diffusely mottled appearance of the osseous structures could reflect some osseous involvement, though finding is on a background diffuse bony demineralization. 3. Severe left hydroureteronephrosis possibly secondary to direct involvement or obstruction by the prostate lesion detailed above. Obstructive urolithiasis. Bilateral renal cortical thinning noted as well. 4. Impacted deformity of the right subcapital femoral neck. Possibly acute with fracture line difficult to discern in the setting of demineralization. Recommend correlation with point tenderness. 5. Rectal stool ball measuring up to 8.5 cm in diameter, correlate for symptoms of impaction. 6. Nonvisualization of the gallbladder, correlate for surgical history. Prominence of the biliary tree may reflect post cholecystectomy reservoir effect or senescent change. 7.  Aortic Atherosclerosis (ICD10-I70.0).  8. Calcified noncalcified atheromatous plaque results in proximal occlusion of the right internal iliac artery and more distal branches of the left internal iliac artery, incompletely characterized on this non angiographic technique. 9. Multifocal fusiform infrarenal abdominal aortic aneurysm measuring up to 3 cm in diameter. Recommend follow-up ultrasound every 3 years. This recommendation follows ACR consensus guidelines: White Paper of the ACR Incidental Findings Committee II on Vascular Findings. J Am Coll Radiol 2013; 10:789-794. 10. Likely chronic interstitial lung disease and bronchitic changes in the lung bases, incompletely characterized on this exam. Nodules seen on comparison radiography are not well visualized. Outpatient CT of the chest is recommended. These results were called by telephone at the time of interpretation on 03/29/2020 at 1:06 am to provider [facility] , who verbally acknowledged these results. Electronically Signed   By: Lovena Le M.D.   On: 03/29/2020 01:08   DG Chest Port 1 View  Result Date: 04/05/2020 CLINICAL DATA:  One [age] in 40-year-old male with a history of questionable sepsis EXAM: PORTABLE CHEST 1 VIEW COMPARISON:  Chest x-ray 09/24/2017 FINDINGS: Apical lordotic positioning accentuates the vascular pedicle, with redemonstration of tortuous thoracic aorta as was demonstrated previously. No evidence of interlobular septal thickening. Focal opacity in the left mid lung is essentially unchanged from the prior plain film. Patchy opacity of the mid right lung in the retrocardiac region appears new. No pneumothorax.  No pleural effusion. IMPRESSION: Patchy airspace opacity in the right mid and lower lung appears new from the comparison, potentially progressive scarring versus lobar pneumonia. If further imaging is necessary, a formal PA and lateral chest x-ray may be considered or alternatively chest CT. Tortuous thoracic aorta. Scarring in the left lung. Electronically  Signed   By: Corrie Mckusick D.O.   On: 04/22/2020 10:24   DG Abdomen Acute W/Chest  Result Date: 03/28/2020 CLINICAL DATA:  Emesis.  Nausea and vomiting. EXAM: DG ABDOMEN ACUTE WITH 1 VIEW CHEST COMPARISON:  Chest x-ray dated September 24, 2017. FINDINGS: There are new spiculated appearing pulmonary nodules bilaterally, the largest located in the left mid lung zone while the other is located at the right lung base. The heart size is enlarged but relatively stable from prior study. The thoracic aorta appears to be dilated. There are atherosclerotic changes of the thoracic aorta. There is no pneumothorax. There  may be a small right-sided pleural effusion. No definite acute osseous abnormality. There are dilated loops of small bowel in the mid abdomen. There is no definite pneumatosis or free air. There are advanced degenerative changes of the visualized thoracolumbar spine and bilateral hips. There is diffuse osteopenia. IMPRESSION: 1. No acute cardiopulmonary process. 2. Spiculated appearing bilateral pulmonary nodules. Follow-up with an outpatient CT of the chest is recommended. 3. Dilated loops of small bowel in the mid abdomen concerning for developing small bowel obstruction or ileus. 4. Cardiomegaly.  Dilated and likely aneurysmal thoracic aorta. Electronically Signed   By: Constance Holster M.D.   On: 03/28/2020 22:18    Microbiology: Recent Results (from the past 240 hour(s))  Blood Culture (routine x 2)     Status: None   Collection Time: 04/08/2020  9:46 AM   Specimen: BLOOD  Result Value Ref Range Status   Specimen Description   Final    BLOOD BLOOD LEFT FOREARM Performed at [facility] [address], [place], [place] [address]    Special Requests   Final    BOTTLES DRAWN AEROBIC AND ANAEROBIC Blood Culture adequate volume Performed at [facility] [address], [place], [place] [address]    Culture   Final    NO GROWTH 5 DAYS Performed at [facility], [place] [address] [place],  [address]    Report Status 04/22/2020 FINAL  Final  Resp Panel by RT-PCR (Flu A&B, Covid) Nasopharyngeal Swab     Status: None   Collection Time: 04/25/2020 11:30 AM   Specimen: Nasopharyngeal Swab; Nasopharyngeal(NP) swabs in vial transport medium  Result Value Ref Range Status   SARS Coronavirus 2 by RT PCR NEGATIVE NEGATIVE Final    Comment: (NOTE) SARS-CoV-2 target nucleic acids are NOT DETECTED.  The SARS-CoV-2 RNA is generally detectable in upper respiratory specimens during the acute phase of infection. The lowest concentration of SARS-CoV-2 viral copies this assay can detect is 138 copies/mL. A negative result does not preclude SARS-Cov-2 infection and should not be used as the sole basis for treatment or other patient management decisions. A negative result may occur with  improper specimen collection/handling, submission of specimen other than nasopharyngeal swab, presence of viral mutation(s) within the areas targeted by this assay, and inadequate number of viral copies(<138 copies/mL). A negative result must be combined with clinical observations, patient history, and epidemiological information. The expected result is Negative.  Fact Sheet for Patients:  EntrepreneurPulse.com.au  Fact Sheet for Healthcare Providers:  IncredibleEmployment.be  This test is no t yet approved or cleared by the [place] FDA and  has been authorized for detection and/or diagnosis of SARS-CoV-2 by FDA under an Emergency Use Authorization (EUA). This EUA will remain  in effect (meaning this test can be used) for the duration of the COVID-19 declaration under Section 564(b)(1) of the Act, 21 U.S.C.section 360bbb-3(b)(1), unless the authorization is terminated  or revoked sooner.       Influenza A by PCR NEGATIVE NEGATIVE Final   Influenza B by PCR NEGATIVE NEGATIVE Final    Comment: (NOTE) The Xpert  Xpress SARS-CoV-2/FLU/RSV plus assay is intended as an aid in the diagnosis of influenza from Nasopharyngeal swab specimens and should not be used as a sole basis for treatment. Nasal washings and aspirates are unacceptable for Xpert Xpress SARS-CoV-2/FLU/RSV testing.  Fact Sheet for Patients: EntrepreneurPulse.com.au  Fact Sheet for Healthcare Providers: IncredibleEmployment.be  This test is not yet approved or cleared  by the [place] and has been authorized for detection and/or diagnosis of SARS-CoV-2 by FDA under an Emergency Use Authorization (EUA). This EUA will remain in effect (meaning this test can be used) for the duration of the COVID-19 declaration under Section 564(b)(1) of the Act, 21 U.S.C. section 360bbb-3(b)(1), unless the authorization is terminated or revoked.  Performed at [facility], [place] [address] [place], [place] [address]   Urine culture     Status: None   Collection Time: 04/15/2020 11:45 AM   Specimen: In/Out Cath Urine  Result Value Ref Range Status   Specimen Description   Final    IN/OUT CATH URINE Performed at [facility] [address] [place], [place] [address]    Special Requests   Final    NONE Performed at [facility], [place] [address] [place], [place] [address]    Culture   Final    NO GROWTH Performed at [facility], [place] [address] [place], [place] [address]    Report Status 04/18/2020 FINAL  Final  MRSA PCR Screening     Status: None   Collection Time: 04/18/20  4:20 PM   Specimen: Nasopharyngeal  Result Value Ref Range Status   MRSA by PCR NEGATIVE NEGATIVE Final    Comment:        The GeneXpert MRSA Assay (FDA approved for NASAL specimens only), is one component of a comprehensive MRSA colonization surveillance program. It is not intended to diagnose MRSA infection nor to guide or monitor treatment for MRSA  infections. Performed at [facility], [place] [address] [place], [place] [address]      Labs: Basic Metabolic Panel: Recent Labs  Lab 04/19/20 0906 04/20/20 0812 04/21/20 0403 04/22/20 0400 05-06-2020 0344  NA 138 139 140 141 144  K 4.2 4.2 4.5 4.3 3.8  CL 108 109 111 112* 113*  CO2 21* 20* 18* 17* 16*  GLUCOSE 105* 136* 124* 79 103*  BUN 33* 34* 39* 41* 48*  CREATININE 1.46* 1.81* 2.93* 2.95* 2.14*  CALCIUM 8.6* 8.6* 8.6* 8.7* 8.6*  MG 2.0 1.9 2.0  --   --   PHOS 2.4* 3.0 4.5  --   --    Liver Function Tests: Recent Labs  Lab 04/19/20 0906 04/20/20 0812 04/21/20 0403 04/22/20 0400 05/06/20 0344  AST 60* 57* 40 34 21  ALT 23 28 26 29 24   ALKPHOS 80 79 74 78 81  BILITOT 1.0 0.8 0.7 0.9 1.1  PROT 5.9* 5.9* 5.5* 5.9* 5.7*  ALBUMIN 2.9* 2.9* 2.6* 2.7* 2.5*   No results for input(s): LIPASE, AMYLASE in the last 168 hours. No results for input(s): AMMONIA in the last 168 hours. CBC: Recent Labs  Lab 04/16/2020 0959 04/18/20 0800 04/19/20 0906 04/20/20 0812 04/21/20 0403 04/22/20 0400 05/06/20 0344  WBC 8.0   < > 7.3 5.8 4.4 3.5* 2.8*  NEUTROABS 7.6  --   --   --   --  2.8 2.2  HGB 10.1*   < > 10.8* 11.1* 11.0* 11.3* 11.6*  HCT 30.5*   < > 33.2* 33.5* 33.0* 33.9* 34.9*  MCV 98.1   < > 99.1 95.7 95.4 95.5 95.4  PLT 187   < > 168 179 170 152 149*   < > = values in this interval not displayed.   Cardiac Enzymes: No results for input(s): CKTOTAL, CKMB, CKMBINDEX, TROPONINI in the last 168 hours. D-Dimer No results for input(s): DDIMER  in the last 72 hours. BNP: Invalid input(s): POCBNP CBG: No results for input(s): GLUCAP in the last 168 hours. Anemia work up No results for input(s): VITAMINB12, FOLATE, FERRITIN, TIBC, IRON, RETICCTPCT in the last 72 hours. Urinalysis    Component Value Date/Time   COLORURINE YELLOW 04/05/2020 1145   APPEARANCEUR CLEAR 04/16/2020 1145   LABSPEC 1.012 04/14/2020 1145   PHURINE 6.0 04/16/2020 1145    GLUCOSEU NEGATIVE 04/26/2020 1145   HGBUR NEGATIVE 04/10/2020 [place] 04/21/2020 1145   KETONESUR NEGATIVE 04/05/2020 1145   PROTEINUR NEGATIVE 04/13/2020 1145   UROBILINOGEN 0.2 01/06/2015 0927   NITRITE NEGATIVE 04/20/2020 1145   LEUKOCYTESUR NEGATIVE 04/22/2020 1145   Sepsis Labs Invalid input(s): PROCALCITONIN,  WBC,  LACTICIDVEN     SIGNED:  Nita Sells, MD  [facility] 25-Apr-2020, 2:22 PM Pager   If 7PM-7AM, please contact night-coverage [email or web address] Password [email or web address]

## 2020-04-27 NOTE — Progress Notes (Signed)
Physical Therapy Discharge Patient Details Name: Howard Padilla MRN: [id number] DOB: [birth year] Today's Date: 2020/05/16 Time:  -     Patient discharged from PT services secondary to medical decline.Claretha Cooper 2020-05-16, 2:19 PM [place] Pager [phone] Office [phone]

## 2020-04-27 NOTE — Progress Notes (Signed)
Chaplain responded to page to offer comfort to grieving widow and grandfather at pt's bedside.  Wife requested prayer for God to take [place] home, for comfort for herself and her family.     Chaplain prayed at bedside.  [place]

## 2020-04-27 NOTE — Progress Notes (Signed)
OT Cancellation Note  Patient Details Name: Howard Padilla MRN: [id number] DOB: May 03, 1915   Cancelled Treatment:    Reason Eval/Treat Not Completed: Fatigue/lethargy limiting ability to participate. Patient is not alert enough to participate in therapy. Rn reports patient medically declining and palliative/hospice being pursued. OT will sign off. If patient improves or GOC change please re-consult as needed.   [name] [name] 05/20/2020, 10:05 AM

## 2020-04-27 NOTE — Progress Notes (Signed)
Palliative care progress note  I was reviewing chart for [name] when RN notified me that he had stopped breathing.  His wife and granddaughter were present in the room on my arrival.  I introduced myself and examined [name].  Exam: Patient was lying in bed.  No visible chest rise.  Does not respond to verbal or tactile stimulation. Pupils fixed and dilated.  No corneal reflex. No palpable central or peripheral pulses. No audible heart or lung sounds for greater than one minute.  Time of death: 1210pm  I offered condolences and support to family at bedside. Chaplain arrived and will continue to support family.  I notified Dr. Verlon Au.  Total time: 20 minutes  Greater than 50%  of this time was spent counseling and coordinating care related to the above assessment and plan.  Micheline Rough, MD [facility] [phone]

## 2020-04-27 DEATH — deceased

## 2020-05-02 IMAGING — CT CT HEAD W/O CM
3 of 4 series · 13 of 47 positions shown, 15 images · non-contrast
Comparison: 07/14/2016 CT.

CLINICAL DATA: [REDACTED] male became weak and dizzy at
home. No injury. Initial encounter.

EXAM:
CT HEAD WITHOUT CONTRAST
TECHNIQUE: Contiguous axial images were obtained from the base of the skull
through the vertex without intravenous contrast.

[Series 3: head wo · axial · 0.46mm/px · z∈[-168,-58]mm · 7 of 30 slices shown, 9 images]
[im 4/30  brain]
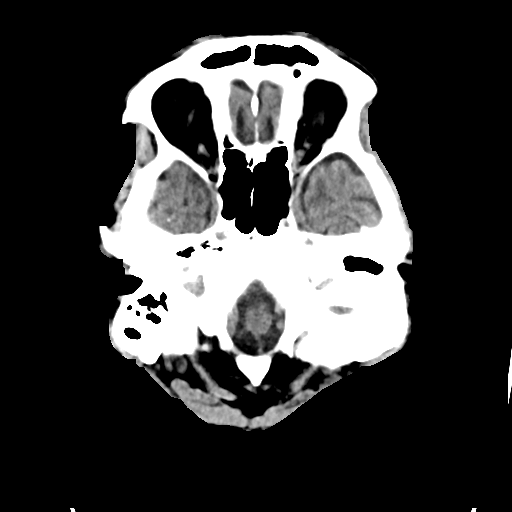
[im 4/30  bone]
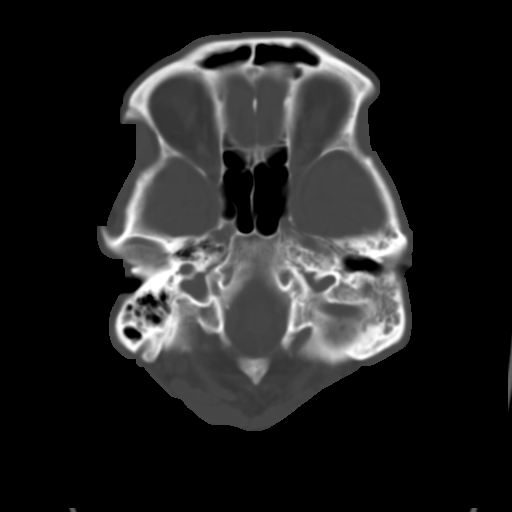
[im 8/30  brain]
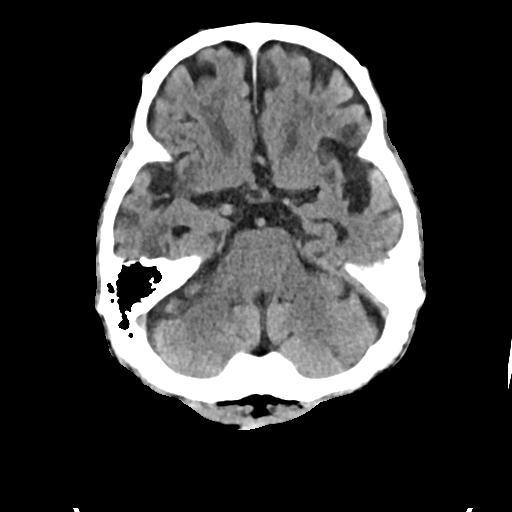
[im 11/30  brain]
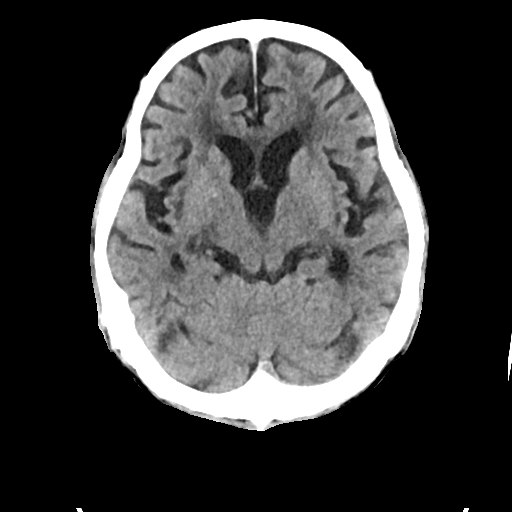
[im 15/30  brain]
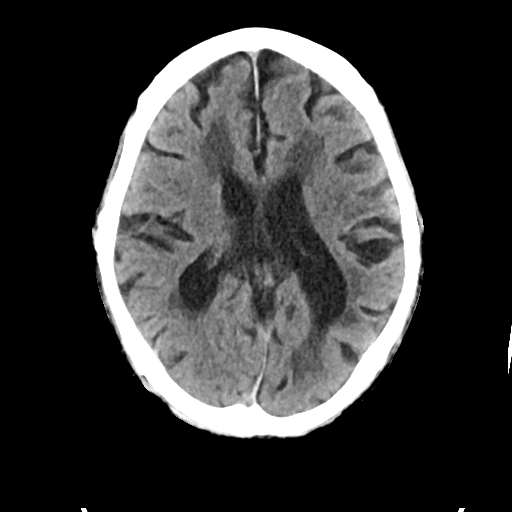
[im 19/30  brain]
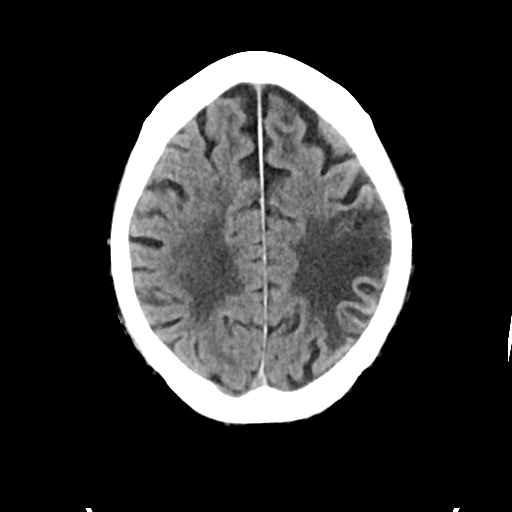
[im 19/30  bone]
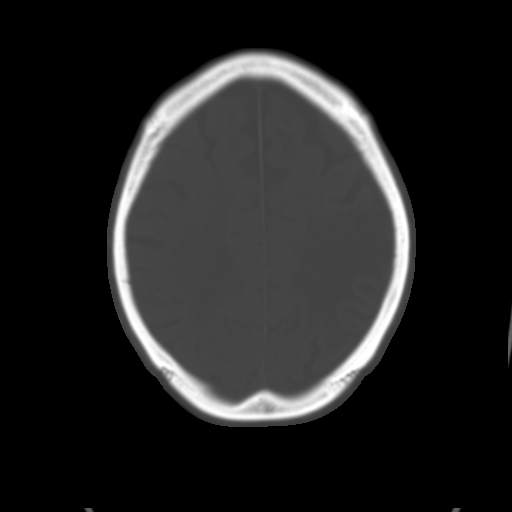
[im 22/30  brain]
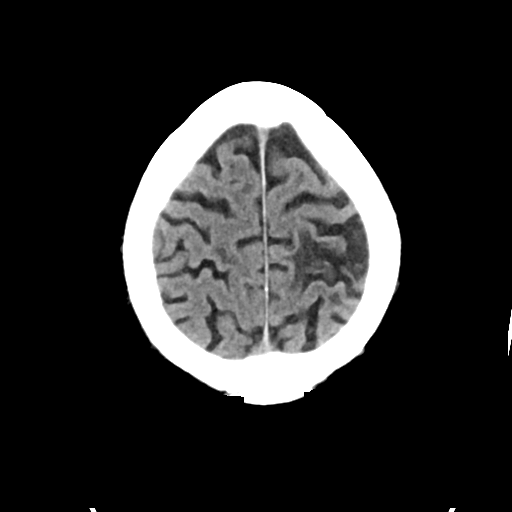
[im 26/30  brain]
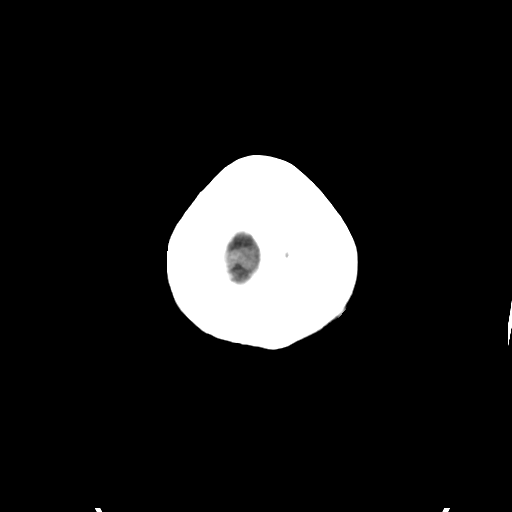

[Series 5: cor soft · coronal · 0.29mm/px · 3 of 81 slices shown]
[im 27/81  brain]
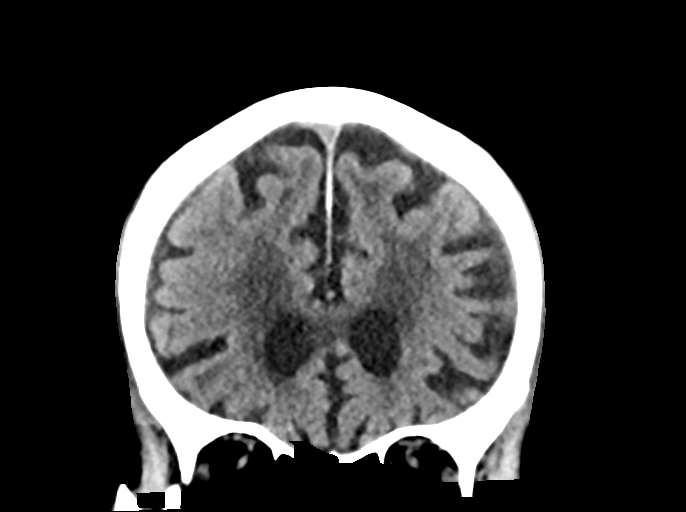
[im 36/81  brain]
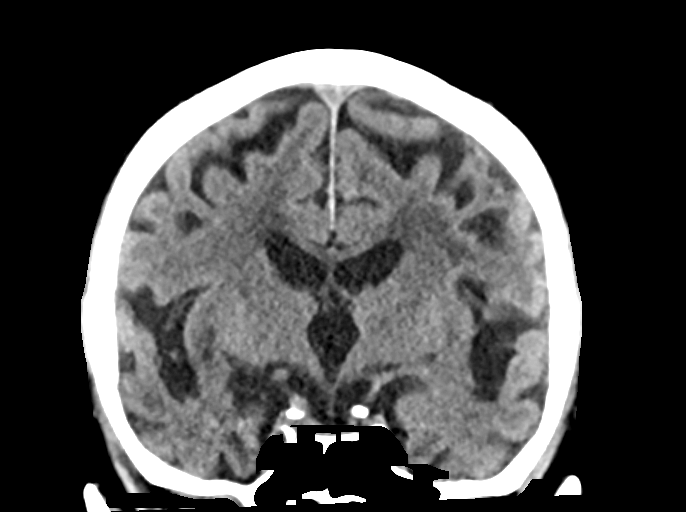
[im 45/81  brain]
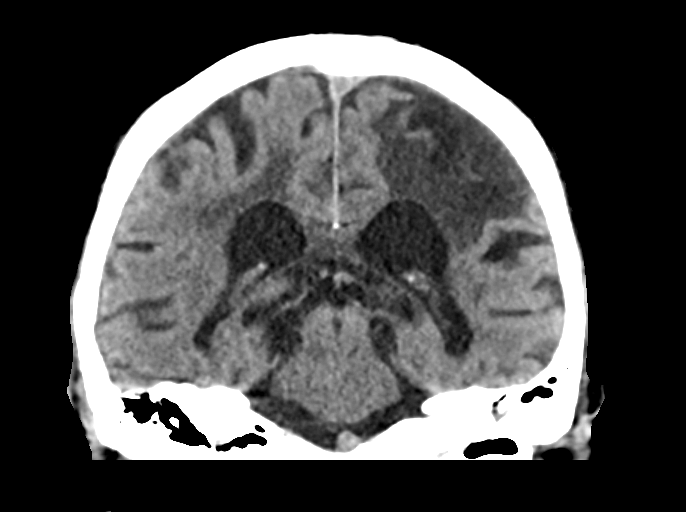

[Series 6: sag soft · sagittal · 0.29mm/px · 3 of 67 slices shown]
[im 23/67  brain]
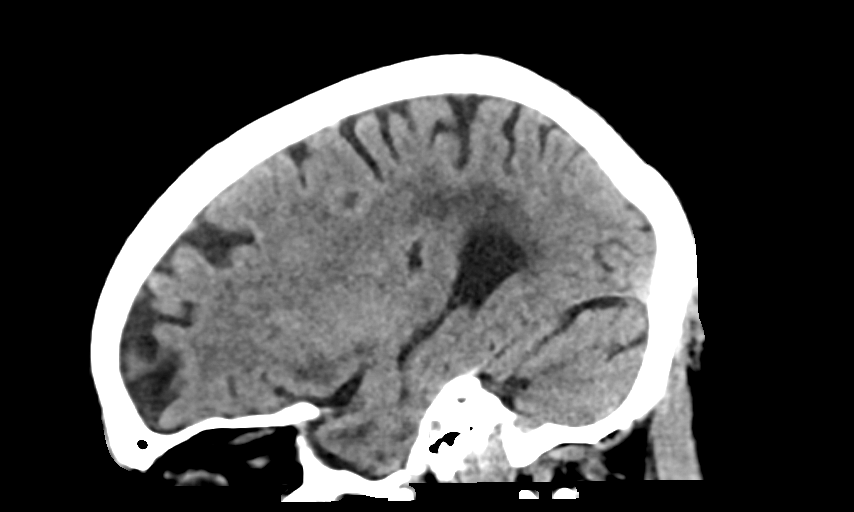
[im 34/67  brain]
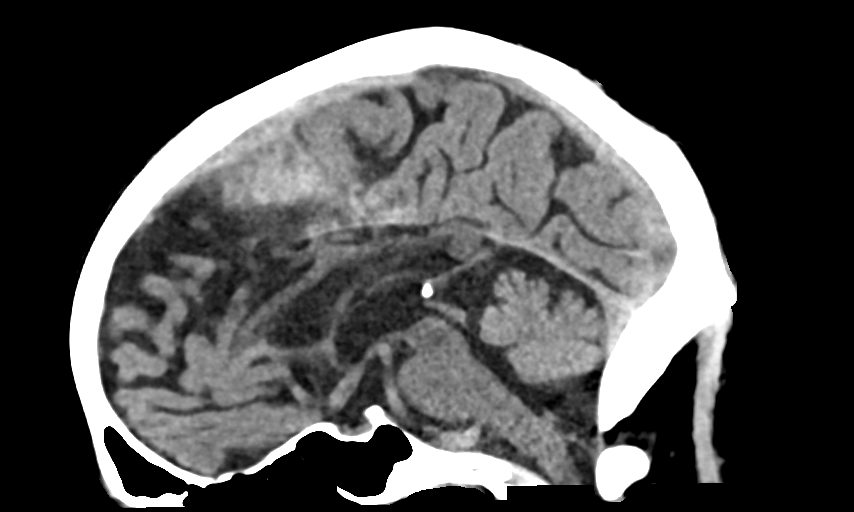
[im 45/67  brain]
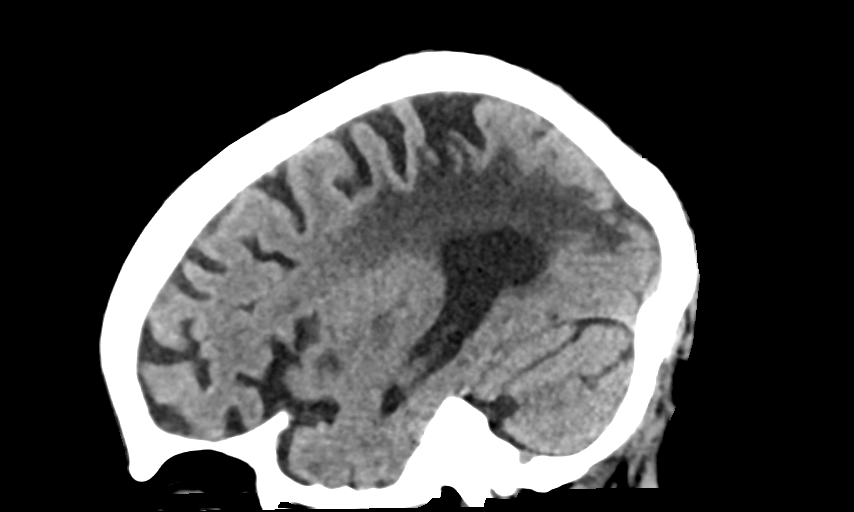

[13 of 47 positions shown; findings below may reference images not displayed]

FINDINGS: Brain: No intracranial hemorrhage or CT evidence of large acute
infarct.

Remote moderately large posterior left frontal-parietal lobe infarct
with encephalomalacia.

Marked chronic microvascular changes.

Moderate to marked global atrophy.

No intracranial mass lesion noted on this unenhanced exam.

Vascular: Vascular calcifications

Skull: No acute abnormality.

Sinuses/Orbits: No acute orbital abnormality. Visualized paranasal
sinuses clear.

Other: Opacification left mastoid air cells and middle ear cavity
without obstructing lesion of the eustachian tube noted.

Transverse ligament hypertrophy with mild narrowing ventral aspect
upper cervical canal.
IMPRESSION: No acute intracranial abnormality.

Remote moderately large posterior left frontal-parietal lobe
infarct.

Marked chronic microvascular changes.

Moderate to marked global atrophy.

Opacification left mastoid air cells and middle ear cavity without
obstructing lesion of the eustachian tube noted.

## 2021-01-03 ENCOUNTER — Other Ambulatory Visit: Payer: Self-pay | Admitting: Internal Medicine

## 2021-01-03 DIAGNOSIS — N400 Enlarged prostate without lower urinary tract symptoms: Secondary | ICD-10-CM
